# Patient Record
Sex: Female | Born: 1955
Health system: Southern US, Community
[De-identification: ages and names within clinical notes are randomized; demographics above are authoritative.]

## PROBLEM LIST (undated history)

## (undated) DIAGNOSIS — D649 Anemia, unspecified: Secondary | ICD-10-CM

## (undated) DIAGNOSIS — C801 Malignant (primary) neoplasm, unspecified: Secondary | ICD-10-CM

## (undated) DIAGNOSIS — G43909 Migraine, unspecified, not intractable, without status migrainosus: Secondary | ICD-10-CM

## (undated) DIAGNOSIS — G4730 Sleep apnea, unspecified: Secondary | ICD-10-CM

## (undated) DIAGNOSIS — F32A Depression, unspecified: Secondary | ICD-10-CM

## (undated) DIAGNOSIS — F329 Major depressive disorder, single episode, unspecified: Secondary | ICD-10-CM

## (undated) DIAGNOSIS — E079 Disorder of thyroid, unspecified: Secondary | ICD-10-CM

## (undated) DIAGNOSIS — M8580 Other specified disorders of bone density and structure, unspecified site: Secondary | ICD-10-CM

## (undated) DIAGNOSIS — K219 Gastro-esophageal reflux disease without esophagitis: Secondary | ICD-10-CM

## (undated) DIAGNOSIS — I10 Essential (primary) hypertension: Secondary | ICD-10-CM

## (undated) DIAGNOSIS — T7840XA Allergy, unspecified, initial encounter: Secondary | ICD-10-CM

## (undated) DIAGNOSIS — F419 Anxiety disorder, unspecified: Secondary | ICD-10-CM

## (undated) HISTORY — DX: Migraine, unspecified, not intractable, without status migrainosus: G43.909

## (undated) HISTORY — DX: Malignant (primary) neoplasm, unspecified: C80.1

## (undated) HISTORY — PX: GALLBLADDER SURGERY: SHX652

## (undated) HISTORY — DX: Essential (primary) hypertension: I10

## (undated) HISTORY — DX: Depression, unspecified: F32.A

## (undated) HISTORY — DX: Sleep apnea, unspecified: G47.30

## (undated) HISTORY — DX: Major depressive disorder, single episode, unspecified: F32.9

## (undated) HISTORY — PX: COLONOSCOPY: SHX174

## (undated) HISTORY — PX: PALATE / UVULA BIOPSY / EXCISION: SUR128

## (undated) HISTORY — DX: Anxiety disorder, unspecified: F41.9

## (undated) HISTORY — DX: Disorder of thyroid, unspecified: E07.9

## (undated) HISTORY — DX: Gastro-esophageal reflux disease without esophagitis: K21.9

## (undated) HISTORY — DX: Other specified disorders of bone density and structure, unspecified site: M85.80

## (undated) HISTORY — DX: Allergy, unspecified, initial encounter: T78.40XA

## (undated) HISTORY — PX: KNEE ARTHROSCOPY: SUR90

## (undated) HISTORY — DX: Anemia, unspecified: D64.9

---

## 1984-11-18 HISTORY — PX: NEUROFIBROMA EXCISION: SHX2089

## 2016-03-15 DIAGNOSIS — M25562 Pain in left knee: Secondary | ICD-10-CM | POA: Diagnosis not present

## 2016-03-15 DIAGNOSIS — M79642 Pain in left hand: Secondary | ICD-10-CM | POA: Diagnosis not present

## 2016-04-11 DIAGNOSIS — M79671 Pain in right foot: Secondary | ICD-10-CM | POA: Diagnosis not present

## 2016-04-11 DIAGNOSIS — M109 Gout, unspecified: Secondary | ICD-10-CM | POA: Diagnosis not present

## 2016-04-11 DIAGNOSIS — Z6824 Body mass index (BMI) 24.0-24.9, adult: Secondary | ICD-10-CM | POA: Diagnosis not present

## 2016-04-24 DIAGNOSIS — G4733 Obstructive sleep apnea (adult) (pediatric): Secondary | ICD-10-CM | POA: Diagnosis not present

## 2016-04-25 DIAGNOSIS — H524 Presbyopia: Secondary | ICD-10-CM | POA: Diagnosis not present

## 2016-07-17 DIAGNOSIS — M5127 Other intervertebral disc displacement, lumbosacral region: Secondary | ICD-10-CM | POA: Diagnosis not present

## 2016-07-17 DIAGNOSIS — Z981 Arthrodesis status: Secondary | ICD-10-CM | POA: Diagnosis not present

## 2016-07-17 DIAGNOSIS — M5126 Other intervertebral disc displacement, lumbar region: Secondary | ICD-10-CM | POA: Diagnosis not present

## 2016-07-17 DIAGNOSIS — D1809 Hemangioma of other sites: Secondary | ICD-10-CM | POA: Diagnosis not present

## 2016-07-18 DIAGNOSIS — M545 Low back pain: Secondary | ICD-10-CM | POA: Diagnosis not present

## 2016-07-18 DIAGNOSIS — M533 Sacrococcygeal disorders, not elsewhere classified: Secondary | ICD-10-CM | POA: Diagnosis not present

## 2016-08-14 DIAGNOSIS — G43809 Other migraine, not intractable, without status migrainosus: Secondary | ICD-10-CM | POA: Diagnosis not present

## 2016-08-29 DIAGNOSIS — R51 Headache: Secondary | ICD-10-CM | POA: Diagnosis not present

## 2016-08-29 DIAGNOSIS — R5383 Other fatigue: Secondary | ICD-10-CM | POA: Diagnosis not present

## 2016-08-29 DIAGNOSIS — F329 Major depressive disorder, single episode, unspecified: Secondary | ICD-10-CM | POA: Diagnosis not present

## 2016-09-02 DIAGNOSIS — D649 Anemia, unspecified: Secondary | ICD-10-CM | POA: Diagnosis not present

## 2016-09-02 DIAGNOSIS — R5383 Other fatigue: Secondary | ICD-10-CM | POA: Diagnosis not present

## 2016-09-02 DIAGNOSIS — D696 Thrombocytopenia, unspecified: Secondary | ICD-10-CM | POA: Diagnosis not present

## 2016-09-23 DIAGNOSIS — F4323 Adjustment disorder with mixed anxiety and depressed mood: Secondary | ICD-10-CM | POA: Diagnosis not present

## 2016-09-27 DIAGNOSIS — F329 Major depressive disorder, single episode, unspecified: Secondary | ICD-10-CM | POA: Diagnosis not present

## 2016-09-27 DIAGNOSIS — E039 Hypothyroidism, unspecified: Secondary | ICD-10-CM | POA: Diagnosis not present

## 2016-09-27 DIAGNOSIS — G43809 Other migraine, not intractable, without status migrainosus: Secondary | ICD-10-CM | POA: Diagnosis not present

## 2016-09-30 DIAGNOSIS — F4323 Adjustment disorder with mixed anxiety and depressed mood: Secondary | ICD-10-CM | POA: Diagnosis not present

## 2016-10-05 DIAGNOSIS — G4733 Obstructive sleep apnea (adult) (pediatric): Secondary | ICD-10-CM | POA: Diagnosis not present

## 2016-10-06 DIAGNOSIS — G4733 Obstructive sleep apnea (adult) (pediatric): Secondary | ICD-10-CM | POA: Diagnosis not present

## 2016-10-23 DIAGNOSIS — R0683 Snoring: Secondary | ICD-10-CM | POA: Diagnosis not present

## 2016-10-23 DIAGNOSIS — G4733 Obstructive sleep apnea (adult) (pediatric): Secondary | ICD-10-CM | POA: Diagnosis not present

## 2016-12-12 DIAGNOSIS — F4321 Adjustment disorder with depressed mood: Secondary | ICD-10-CM | POA: Diagnosis not present

## 2016-12-19 DIAGNOSIS — F4321 Adjustment disorder with depressed mood: Secondary | ICD-10-CM | POA: Diagnosis not present

## 2016-12-24 DIAGNOSIS — G4733 Obstructive sleep apnea (adult) (pediatric): Secondary | ICD-10-CM | POA: Diagnosis not present

## 2016-12-24 DIAGNOSIS — R0683 Snoring: Secondary | ICD-10-CM | POA: Diagnosis not present

## 2016-12-25 DIAGNOSIS — N6310 Unspecified lump in the right breast, unspecified quadrant: Secondary | ICD-10-CM | POA: Diagnosis not present

## 2016-12-25 DIAGNOSIS — M7661 Achilles tendinitis, right leg: Secondary | ICD-10-CM | POA: Diagnosis not present

## 2016-12-25 DIAGNOSIS — M25562 Pain in left knee: Secondary | ICD-10-CM | POA: Diagnosis not present

## 2016-12-25 DIAGNOSIS — N644 Mastodynia: Secondary | ICD-10-CM | POA: Diagnosis not present

## 2016-12-25 DIAGNOSIS — Z1231 Encounter for screening mammogram for malignant neoplasm of breast: Secondary | ICD-10-CM | POA: Diagnosis not present

## 2016-12-25 DIAGNOSIS — R928 Other abnormal and inconclusive findings on diagnostic imaging of breast: Secondary | ICD-10-CM | POA: Diagnosis not present

## 2016-12-26 DIAGNOSIS — F4321 Adjustment disorder with depressed mood: Secondary | ICD-10-CM | POA: Diagnosis not present

## 2016-12-30 DIAGNOSIS — F4321 Adjustment disorder with depressed mood: Secondary | ICD-10-CM | POA: Diagnosis not present

## 2016-12-30 DIAGNOSIS — R0683 Snoring: Secondary | ICD-10-CM | POA: Diagnosis not present

## 2016-12-30 DIAGNOSIS — G4733 Obstructive sleep apnea (adult) (pediatric): Secondary | ICD-10-CM | POA: Diagnosis not present

## 2017-01-03 DIAGNOSIS — Z6824 Body mass index (BMI) 24.0-24.9, adult: Secondary | ICD-10-CM | POA: Diagnosis not present

## 2017-01-03 DIAGNOSIS — Z01419 Encounter for gynecological examination (general) (routine) without abnormal findings: Secondary | ICD-10-CM | POA: Diagnosis not present

## 2017-01-06 DIAGNOSIS — F4321 Adjustment disorder with depressed mood: Secondary | ICD-10-CM | POA: Diagnosis not present

## 2017-01-09 DIAGNOSIS — D252 Subserosal leiomyoma of uterus: Secondary | ICD-10-CM | POA: Diagnosis not present

## 2017-01-09 DIAGNOSIS — N939 Abnormal uterine and vaginal bleeding, unspecified: Secondary | ICD-10-CM | POA: Diagnosis not present

## 2017-01-09 DIAGNOSIS — D251 Intramural leiomyoma of uterus: Secondary | ICD-10-CM | POA: Diagnosis not present

## 2017-01-09 DIAGNOSIS — F4321 Adjustment disorder with depressed mood: Secondary | ICD-10-CM | POA: Diagnosis not present

## 2017-01-09 DIAGNOSIS — Z780 Asymptomatic menopausal state: Secondary | ICD-10-CM | POA: Diagnosis not present

## 2017-01-16 DIAGNOSIS — R51 Headache: Secondary | ICD-10-CM | POA: Diagnosis not present

## 2017-01-16 DIAGNOSIS — G43719 Chronic migraine without aura, intractable, without status migrainosus: Secondary | ICD-10-CM | POA: Diagnosis not present

## 2017-01-16 DIAGNOSIS — Z79899 Other long term (current) drug therapy: Secondary | ICD-10-CM | POA: Diagnosis not present

## 2017-01-16 DIAGNOSIS — Z049 Encounter for examination and observation for unspecified reason: Secondary | ICD-10-CM | POA: Diagnosis not present

## 2017-01-20 DIAGNOSIS — M542 Cervicalgia: Secondary | ICD-10-CM | POA: Diagnosis not present

## 2017-01-20 DIAGNOSIS — M791 Myalgia: Secondary | ICD-10-CM | POA: Diagnosis not present

## 2017-01-20 DIAGNOSIS — G518 Other disorders of facial nerve: Secondary | ICD-10-CM | POA: Diagnosis not present

## 2017-01-20 DIAGNOSIS — G43719 Chronic migraine without aura, intractable, without status migrainosus: Secondary | ICD-10-CM | POA: Diagnosis not present

## 2017-01-21 DIAGNOSIS — M7661 Achilles tendinitis, right leg: Secondary | ICD-10-CM | POA: Diagnosis not present

## 2017-01-21 DIAGNOSIS — M25562 Pain in left knee: Secondary | ICD-10-CM | POA: Diagnosis not present

## 2017-01-27 DIAGNOSIS — F4321 Adjustment disorder with depressed mood: Secondary | ICD-10-CM | POA: Diagnosis not present

## 2017-01-28 DIAGNOSIS — R0683 Snoring: Secondary | ICD-10-CM | POA: Diagnosis not present

## 2017-01-28 DIAGNOSIS — G4733 Obstructive sleep apnea (adult) (pediatric): Secondary | ICD-10-CM | POA: Diagnosis not present

## 2017-02-04 DIAGNOSIS — G518 Other disorders of facial nerve: Secondary | ICD-10-CM | POA: Diagnosis not present

## 2017-02-04 DIAGNOSIS — M542 Cervicalgia: Secondary | ICD-10-CM | POA: Diagnosis not present

## 2017-02-04 DIAGNOSIS — M791 Myalgia: Secondary | ICD-10-CM | POA: Diagnosis not present

## 2017-02-04 DIAGNOSIS — G43719 Chronic migraine without aura, intractable, without status migrainosus: Secondary | ICD-10-CM | POA: Diagnosis not present

## 2017-02-24 DIAGNOSIS — M542 Cervicalgia: Secondary | ICD-10-CM | POA: Diagnosis not present

## 2017-02-24 DIAGNOSIS — M791 Myalgia: Secondary | ICD-10-CM | POA: Diagnosis not present

## 2017-02-24 DIAGNOSIS — G43719 Chronic migraine without aura, intractable, without status migrainosus: Secondary | ICD-10-CM | POA: Diagnosis not present

## 2017-02-24 DIAGNOSIS — G518 Other disorders of facial nerve: Secondary | ICD-10-CM | POA: Diagnosis not present

## 2017-02-26 DIAGNOSIS — M7661 Achilles tendinitis, right leg: Secondary | ICD-10-CM | POA: Diagnosis not present

## 2017-02-26 DIAGNOSIS — M792 Neuralgia and neuritis, unspecified: Secondary | ICD-10-CM | POA: Diagnosis not present

## 2017-03-12 DIAGNOSIS — G518 Other disorders of facial nerve: Secondary | ICD-10-CM | POA: Diagnosis not present

## 2017-03-12 DIAGNOSIS — G43719 Chronic migraine without aura, intractable, without status migrainosus: Secondary | ICD-10-CM | POA: Diagnosis not present

## 2017-03-12 DIAGNOSIS — M542 Cervicalgia: Secondary | ICD-10-CM | POA: Diagnosis not present

## 2017-03-12 DIAGNOSIS — M791 Myalgia: Secondary | ICD-10-CM | POA: Diagnosis not present

## 2017-03-13 DIAGNOSIS — M5431 Sciatica, right side: Secondary | ICD-10-CM | POA: Diagnosis not present

## 2017-03-24 DIAGNOSIS — G43719 Chronic migraine without aura, intractable, without status migrainosus: Secondary | ICD-10-CM | POA: Diagnosis not present

## 2017-03-24 DIAGNOSIS — M791 Myalgia: Secondary | ICD-10-CM | POA: Diagnosis not present

## 2017-03-24 DIAGNOSIS — M542 Cervicalgia: Secondary | ICD-10-CM | POA: Diagnosis not present

## 2017-03-24 DIAGNOSIS — G518 Other disorders of facial nerve: Secondary | ICD-10-CM | POA: Diagnosis not present

## 2017-03-26 DIAGNOSIS — G43719 Chronic migraine without aura, intractable, without status migrainosus: Secondary | ICD-10-CM | POA: Diagnosis not present

## 2017-03-26 DIAGNOSIS — M791 Myalgia: Secondary | ICD-10-CM | POA: Diagnosis not present

## 2017-03-26 DIAGNOSIS — G518 Other disorders of facial nerve: Secondary | ICD-10-CM | POA: Diagnosis not present

## 2017-03-26 DIAGNOSIS — M542 Cervicalgia: Secondary | ICD-10-CM | POA: Diagnosis not present

## 2017-03-31 DIAGNOSIS — M7661 Achilles tendinitis, right leg: Secondary | ICD-10-CM | POA: Diagnosis not present

## 2017-03-31 DIAGNOSIS — M79671 Pain in right foot: Secondary | ICD-10-CM | POA: Diagnosis not present

## 2017-03-31 DIAGNOSIS — Q667 Congenital pes cavus: Secondary | ICD-10-CM | POA: Diagnosis not present

## 2017-04-11 DIAGNOSIS — M7661 Achilles tendinitis, right leg: Secondary | ICD-10-CM | POA: Diagnosis not present

## 2017-04-15 DIAGNOSIS — M542 Cervicalgia: Secondary | ICD-10-CM | POA: Diagnosis not present

## 2017-04-15 DIAGNOSIS — G518 Other disorders of facial nerve: Secondary | ICD-10-CM | POA: Diagnosis not present

## 2017-04-15 DIAGNOSIS — G43719 Chronic migraine without aura, intractable, without status migrainosus: Secondary | ICD-10-CM | POA: Diagnosis not present

## 2017-04-15 DIAGNOSIS — M791 Myalgia: Secondary | ICD-10-CM | POA: Diagnosis not present

## 2017-04-30 DIAGNOSIS — M791 Myalgia: Secondary | ICD-10-CM | POA: Diagnosis not present

## 2017-04-30 DIAGNOSIS — G518 Other disorders of facial nerve: Secondary | ICD-10-CM | POA: Diagnosis not present

## 2017-04-30 DIAGNOSIS — M542 Cervicalgia: Secondary | ICD-10-CM | POA: Diagnosis not present

## 2017-04-30 DIAGNOSIS — G43719 Chronic migraine without aura, intractable, without status migrainosus: Secondary | ICD-10-CM | POA: Diagnosis not present

## 2017-05-13 DIAGNOSIS — H903 Sensorineural hearing loss, bilateral: Secondary | ICD-10-CM | POA: Diagnosis not present

## 2017-05-13 DIAGNOSIS — R1310 Dysphagia, unspecified: Secondary | ICD-10-CM | POA: Diagnosis not present

## 2017-05-13 DIAGNOSIS — K210 Gastro-esophageal reflux disease with esophagitis: Secondary | ICD-10-CM | POA: Diagnosis not present

## 2017-05-13 DIAGNOSIS — H9319 Tinnitus, unspecified ear: Secondary | ICD-10-CM | POA: Diagnosis not present

## 2017-05-14 DIAGNOSIS — Q667 Congenital pes cavus: Secondary | ICD-10-CM | POA: Diagnosis not present

## 2017-05-14 DIAGNOSIS — M7661 Achilles tendinitis, right leg: Secondary | ICD-10-CM | POA: Diagnosis not present

## 2017-05-28 DIAGNOSIS — R1310 Dysphagia, unspecified: Secondary | ICD-10-CM | POA: Diagnosis not present

## 2017-05-28 DIAGNOSIS — K449 Diaphragmatic hernia without obstruction or gangrene: Secondary | ICD-10-CM | POA: Diagnosis not present

## 2017-06-03 DIAGNOSIS — K210 Gastro-esophageal reflux disease with esophagitis: Secondary | ICD-10-CM | POA: Diagnosis not present

## 2017-06-03 DIAGNOSIS — K449 Diaphragmatic hernia without obstruction or gangrene: Secondary | ICD-10-CM | POA: Diagnosis not present

## 2017-06-03 DIAGNOSIS — R1310 Dysphagia, unspecified: Secondary | ICD-10-CM | POA: Diagnosis not present

## 2017-06-05 DIAGNOSIS — L700 Acne vulgaris: Secondary | ICD-10-CM | POA: Diagnosis not present

## 2017-06-05 DIAGNOSIS — D1801 Hemangioma of skin and subcutaneous tissue: Secondary | ICD-10-CM | POA: Diagnosis not present

## 2017-06-11 DIAGNOSIS — M25511 Pain in right shoulder: Secondary | ICD-10-CM | POA: Diagnosis not present

## 2017-06-24 DIAGNOSIS — G518 Other disorders of facial nerve: Secondary | ICD-10-CM | POA: Diagnosis not present

## 2017-06-24 DIAGNOSIS — M542 Cervicalgia: Secondary | ICD-10-CM | POA: Diagnosis not present

## 2017-06-24 DIAGNOSIS — M791 Myalgia: Secondary | ICD-10-CM | POA: Diagnosis not present

## 2017-06-24 DIAGNOSIS — G43719 Chronic migraine without aura, intractable, without status migrainosus: Secondary | ICD-10-CM | POA: Diagnosis not present

## 2017-06-26 DIAGNOSIS — G43719 Chronic migraine without aura, intractable, without status migrainosus: Secondary | ICD-10-CM | POA: Diagnosis not present

## 2017-06-26 DIAGNOSIS — M791 Myalgia: Secondary | ICD-10-CM | POA: Diagnosis not present

## 2017-06-26 DIAGNOSIS — M542 Cervicalgia: Secondary | ICD-10-CM | POA: Diagnosis not present

## 2017-06-26 DIAGNOSIS — G518 Other disorders of facial nerve: Secondary | ICD-10-CM | POA: Diagnosis not present

## 2017-07-14 DIAGNOSIS — N6311 Unspecified lump in the right breast, upper outer quadrant: Secondary | ICD-10-CM | POA: Diagnosis not present

## 2017-07-15 DIAGNOSIS — R928 Other abnormal and inconclusive findings on diagnostic imaging of breast: Secondary | ICD-10-CM | POA: Diagnosis not present

## 2017-07-15 DIAGNOSIS — N6489 Other specified disorders of breast: Secondary | ICD-10-CM | POA: Diagnosis not present

## 2017-07-30 DIAGNOSIS — G518 Other disorders of facial nerve: Secondary | ICD-10-CM | POA: Diagnosis not present

## 2017-07-30 DIAGNOSIS — M542 Cervicalgia: Secondary | ICD-10-CM | POA: Diagnosis not present

## 2017-07-30 DIAGNOSIS — G43719 Chronic migraine without aura, intractable, without status migrainosus: Secondary | ICD-10-CM | POA: Diagnosis not present

## 2017-07-30 DIAGNOSIS — M791 Myalgia: Secondary | ICD-10-CM | POA: Diagnosis not present

## 2017-09-11 DIAGNOSIS — M542 Cervicalgia: Secondary | ICD-10-CM | POA: Diagnosis not present

## 2017-09-11 DIAGNOSIS — M791 Myalgia, unspecified site: Secondary | ICD-10-CM | POA: Diagnosis not present

## 2017-09-11 DIAGNOSIS — G518 Other disorders of facial nerve: Secondary | ICD-10-CM | POA: Diagnosis not present

## 2017-09-11 DIAGNOSIS — G43719 Chronic migraine without aura, intractable, without status migrainosus: Secondary | ICD-10-CM | POA: Diagnosis not present

## 2017-10-22 DIAGNOSIS — G43719 Chronic migraine without aura, intractable, without status migrainosus: Secondary | ICD-10-CM | POA: Diagnosis not present

## 2017-10-22 DIAGNOSIS — G518 Other disorders of facial nerve: Secondary | ICD-10-CM | POA: Diagnosis not present

## 2017-10-22 DIAGNOSIS — M791 Myalgia, unspecified site: Secondary | ICD-10-CM | POA: Diagnosis not present

## 2017-10-22 DIAGNOSIS — M542 Cervicalgia: Secondary | ICD-10-CM | POA: Diagnosis not present

## 2017-10-29 DIAGNOSIS — G518 Other disorders of facial nerve: Secondary | ICD-10-CM | POA: Diagnosis not present

## 2017-10-29 DIAGNOSIS — M791 Myalgia, unspecified site: Secondary | ICD-10-CM | POA: Diagnosis not present

## 2017-10-29 DIAGNOSIS — M542 Cervicalgia: Secondary | ICD-10-CM | POA: Diagnosis not present

## 2017-10-29 DIAGNOSIS — G43719 Chronic migraine without aura, intractable, without status migrainosus: Secondary | ICD-10-CM | POA: Diagnosis not present

## 2017-11-02 NOTE — Progress Notes (Signed)
Subjective:    Patient ID: Tracie Hart, female    DOB: Jul 28, 1956, 61 y.o.   MRN: [id number]  HPI  She is here to establish with a new pcp.  She is here for follow up.   Hypothyroidism:  She is taking her medication daily.  She denies any recent changes in energy or weight that are unexplained.   Depression: She is taking her medication daily as prescribed - she is currently taking only 20 mg daily.  The 30 mg was more effective, but she knows she should takes less.  That is why she decreased back to 20 mg. She still feels depressed.  She thinks 30 mg was better and is unsure what to do.  She denies any side effects from the medication. She is not currently exercising.  She is not seeing a therapist.    Migraine headaches:  Follows at the headache clinic. Getting botox and steroid injections.  Uses imitrex as needed.   She has frequent migraines.  She is not compliant with avoiding the triggers.    GERD:  She is taking her medication daily as prescribed.  She denies any GERD symptoms and feels her GERD is well controlled.  She is symptomatic if she misses a dose.     She is active, but does not exercise regularly.    Medications and allergies reviewed with patient and updated if appropriate.  Patient Active Problem List   Diagnosis Date Noted  . Hypothyroidism 11/03/2017  . Depression 11/03/2017  . Family history of diabetes mellitus (DM) 11/03/2017  . Migraines 11/03/2017  . GERD (gastroesophageal reflux disease) 11/03/2017  . OSA (obstructive sleep apnea) 11/03/2017  . Elevated blood pressure reading 11/03/2017  . Hyperlipidemia 11/03/2017  . Overactive bladder 11/03/2017    Current Outpatient Medications on File Prior to Visit  Medication Sig Dispense Refill  . SUMAtriptan (IMITREX) 100 MG tablet Take 100 mg by mouth every 2 (two) hours as needed for migraine. May repeat in 2 hours if headache persists or recurs.     No current facility-administered medications on file  prior to visit.     Past Medical History:  Diagnosis Date  . Depression   . GERD (gastroesophageal reflux disease)   . Migraine   . Thyroid disease     Past Surgical History:  Procedure Laterality Date  . GALLBLADDER SURGERY    . TONSILLECTOMY      Social History   Socioeconomic History  . Marital status: Divorced    Spouse name: None  . Number of children: None  . Years of education: None  . Highest education level: None  Social Needs  . Financial resource strain: None  . Food insecurity - worry: None  . Food insecurity - inability: None  . Transportation needs - medical: None  . Transportation needs - non-medical: None  Occupational History  . None  Tobacco Use  . Smoking status: Never Smoker  . Smokeless tobacco: Never Used  Substance and Sexual Activity  . Alcohol use: Yes  . Drug use: No  . Sexual activity: No  Other Topics Concern  . None  Social History Narrative   No regular exercise    Family History  Problem Relation Age of Onset  . Arthritis Mother   . Diabetes Mother   . Heart disease Mother   . Hypertension Mother   . Stroke Mother   . Arthritis Father   . Depression Father   . Diabetes Father   .  Hyperlipidemia Father   . Hypertension Father   . Depression Brother   . Hypertension Brother   . Heart disease Maternal Grandmother   . COPD Paternal Grandmother   . COPD Paternal Grandfather     Review of Systems  Constitutional: Negative for appetite change, chills, fever and unexpected weight change.  Respiratory: Negative for cough, shortness of breath and wheezing.   Cardiovascular: Positive for leg swelling (from prolonged standing). Negative for chest pain and palpitations.  Gastrointestinal: Positive for anal bleeding (once). Negative for abdominal pain, blood in stool, constipation, diarrhea and nausea.  Genitourinary: Positive for frequency. Negative for dysuria and hematuria.  Musculoskeletal: Positive for back pain  (neurofibroma removed from back - uses tens unit as needed).  Neurological: Positive for headaches (migrianes). Negative for light-headedness.  Psychiatric/Behavioral: Positive for dysphoric mood. The patient is nervous/anxious.        Objective:   Vitals:   11/03/17 1512  BP: (!) 142/94  Pulse: 78  Resp: 16  Temp: 98.3 F (36.8 C)  SpO2: 98%   Wt Readings from Last 3 Encounters:  11/03/17 148 lb (67.1 kg)   Body mass index is 25.01 kg/m.   Physical Exam    Constitutional: She appears well-developed and well-nourished. No distress.  HENT:  Head: Normocephalic and atraumatic.  Right Ear: External ear normal. Normal ear canal and TM Left Ear: External ear normal.  Normal ear canal and TM Mouth/Throat: Oropharynx is clear and moist.  Eyes: Conjunctivae are normal.  Neck: Neck supple. No tracheal deviation present. No thyromegaly present.  No carotid bruit  Cardiovascular: Normal rate, regular rhythm and normal heart sounds.   No murmur heard.  No edema. Pulmonary/Chest: Effort normal and breath sounds normal. No respiratory distress. She has no wheezes. She has no rales.  Abdominal: Soft. She exhibits no distension. There is no tenderness.  Lymphadenopathy: She has no cervical adenopathy.  Skin: Skin is warm and dry. She is not diaphoretic.  Psychiatric: She has a normal mood and affect. Her behavior is normal.       Assessment & Plan:    See Problem List for Assessment and Plan of chronic medical problems.   FU in 6 months

## 2017-11-03 ENCOUNTER — Ambulatory Visit: Payer: BLUE CROSS/BLUE SHIELD | Admitting: Internal Medicine

## 2017-11-03 ENCOUNTER — Encounter: Payer: Self-pay | Admitting: Internal Medicine

## 2017-11-03 VITALS — BP 142/94 | HR 78 | Temp 98.3°F | Resp 16 | Ht 64.5 in | Wt 148.0 lb

## 2017-11-03 DIAGNOSIS — G43809 Other migraine, not intractable, without status migrainosus: Secondary | ICD-10-CM

## 2017-11-03 DIAGNOSIS — F3289 Other specified depressive episodes: Secondary | ICD-10-CM | POA: Diagnosis not present

## 2017-11-03 DIAGNOSIS — Z114 Encounter for screening for human immunodeficiency virus [HIV]: Secondary | ICD-10-CM | POA: Diagnosis not present

## 2017-11-03 DIAGNOSIS — Z1159 Encounter for screening for other viral diseases: Secondary | ICD-10-CM

## 2017-11-03 DIAGNOSIS — G43909 Migraine, unspecified, not intractable, without status migrainosus: Secondary | ICD-10-CM | POA: Insufficient documentation

## 2017-11-03 DIAGNOSIS — Z833 Family history of diabetes mellitus: Secondary | ICD-10-CM

## 2017-11-03 DIAGNOSIS — N3281 Overactive bladder: Secondary | ICD-10-CM | POA: Insufficient documentation

## 2017-11-03 DIAGNOSIS — E7849 Other hyperlipidemia: Secondary | ICD-10-CM

## 2017-11-03 DIAGNOSIS — R030 Elevated blood-pressure reading, without diagnosis of hypertension: Secondary | ICD-10-CM | POA: Insufficient documentation

## 2017-11-03 DIAGNOSIS — E785 Hyperlipidemia, unspecified: Secondary | ICD-10-CM | POA: Insufficient documentation

## 2017-11-03 DIAGNOSIS — K219 Gastro-esophageal reflux disease without esophagitis: Secondary | ICD-10-CM

## 2017-11-03 DIAGNOSIS — G4733 Obstructive sleep apnea (adult) (pediatric): Secondary | ICD-10-CM

## 2017-11-03 DIAGNOSIS — E039 Hypothyroidism, unspecified: Secondary | ICD-10-CM | POA: Insufficient documentation

## 2017-11-03 DIAGNOSIS — E038 Other specified hypothyroidism: Secondary | ICD-10-CM | POA: Diagnosis not present

## 2017-11-03 DIAGNOSIS — F32A Depression, unspecified: Secondary | ICD-10-CM | POA: Insufficient documentation

## 2017-11-03 DIAGNOSIS — F329 Major depressive disorder, single episode, unspecified: Secondary | ICD-10-CM | POA: Insufficient documentation

## 2017-11-03 MED ORDER — LANSOPRAZOLE 30 MG PO CPDR
30.0000 mg | DELAYED_RELEASE_CAPSULE | Freq: Every day | ORAL | 3 refills | Status: DC
Start: 2017-11-03 — End: 2018-10-31

## 2017-11-03 MED ORDER — LEVOTHYROXINE SODIUM 50 MCG PO TABS: 50.0000 ug | ORAL_TABLET | Freq: Every day | ORAL | 3 refills | Status: DC

## 2017-11-03 MED ORDER — ESCITALOPRAM OXALATE 20 MG PO TABS: 20.0000 mg | ORAL_TABLET | Freq: Every day | ORAL | 3 refills | Status: DC

## 2017-11-03 MED ORDER — ESCITALOPRAM OXALATE 10 MG PO TABS: 10.0000 mg | ORAL_TABLET | Freq: Every day | ORAL | 3 refills | Status: DC

## 2017-11-03 NOTE — Assessment & Plan Note (Signed)
Clinically euthyroid Check tsh  Titrate med dose if needed  

## 2017-11-03 NOTE — Assessment & Plan Note (Signed)
BP has been borderline high at doctor's office Has cuff at home - does not check it Start regular exercise Start monitoring BP Check labs

## 2017-11-03 NOTE — Assessment & Plan Note (Signed)
Not controlled Discussed continuing lexapro vs changing to a different medication - wants to stick with lexapro but most likely will do better with a different medication Will continue 20 mg daily, but may take 30 mg intermittently Discussed seeing a therapist Stressed regular exercise

## 2017-11-03 NOTE — Patient Instructions (Addendum)
  Test(s) ordered today. Your results will be released to Elizabeth (or called to you) after review, usually within 72hours after test completion. If any changes need to be made, you will be notified at that same time.  No immunizations administered today.   Medications reviewed and updated.  No changes recommended at this time.  Your prescription(s) have been submitted to your pharmacy. Please take as directed and contact our office if you believe you are having problem(s) with the medication(s).   Please followup in 6 months   [facility] [place] [facility]   [facility]  [address]  [place]  [place], [place] [address]  Main: [phone]

## 2017-11-03 NOTE — Assessment & Plan Note (Addendum)
Diet controlled Check lipids, cmp

## 2017-11-03 NOTE — Assessment & Plan Note (Signed)
GERD controlled, but needs medication daily Continue daily medication

## 2017-11-03 NOTE — Assessment & Plan Note (Signed)
Using mouth device

## 2017-11-03 NOTE — Assessment & Plan Note (Signed)
myrbetriq was not effective Decrease fluids in evening, avoid too much caffeine

## 2017-11-03 NOTE — Assessment & Plan Note (Signed)
Not compliant with a low sugar/carb diet Stressed regular exercise Check a1c

## 2017-11-03 NOTE — Assessment & Plan Note (Signed)
Management per the headaches clinic Trying to avoid imitrex Stressed avoiding triggers, such as sugar

## 2017-11-18 DIAGNOSIS — M542 Cervicalgia: Secondary | ICD-10-CM | POA: Diagnosis not present

## 2017-11-18 DIAGNOSIS — G43719 Chronic migraine without aura, intractable, without status migrainosus: Secondary | ICD-10-CM | POA: Diagnosis not present

## 2017-11-18 DIAGNOSIS — G518 Other disorders of facial nerve: Secondary | ICD-10-CM | POA: Diagnosis not present

## 2017-11-18 DIAGNOSIS — M791 Myalgia, unspecified site: Secondary | ICD-10-CM | POA: Diagnosis not present

## 2017-11-18 DIAGNOSIS — F4323 Adjustment disorder with mixed anxiety and depressed mood: Secondary | ICD-10-CM | POA: Diagnosis not present

## 2017-12-01 ENCOUNTER — Telehealth: Payer: Self-pay | Admitting: Internal Medicine

## 2017-12-01 MED ORDER — SUMATRIPTAN SUCCINATE 100 MG PO TABS: 100.0000 mg | ORAL_TABLET | ORAL | 0 refills | Status: DC | PRN

## 2017-12-01 NOTE — Telephone Encounter (Signed)
Reviewed chart pt is up-to-date sent refills to pof.../lmb  

## 2017-12-01 NOTE — Telephone Encounter (Signed)
Copied from [place] [id number]. Topic: Quick Communication - See Telephone Encounter >> Dec 01, 2017  3:18 PM Tracie Hart wrote: CRM for notification. See Telephone encounter for:   12/01/17.  Patient is requesting her SUMAtriptan (IMITREX) 100 MG tablet. She said that she always takes this but Dr Quay Burow has never written it for her, but she is aware. It is in her chart.   [facility] [place], [place] [address] [place]

## 2017-12-04 DIAGNOSIS — G518 Other disorders of facial nerve: Secondary | ICD-10-CM | POA: Diagnosis not present

## 2017-12-04 DIAGNOSIS — M542 Cervicalgia: Secondary | ICD-10-CM | POA: Diagnosis not present

## 2017-12-04 DIAGNOSIS — M791 Myalgia, unspecified site: Secondary | ICD-10-CM | POA: Diagnosis not present

## 2017-12-04 DIAGNOSIS — F4323 Adjustment disorder with mixed anxiety and depressed mood: Secondary | ICD-10-CM | POA: Diagnosis not present

## 2017-12-04 DIAGNOSIS — G43719 Chronic migraine without aura, intractable, without status migrainosus: Secondary | ICD-10-CM | POA: Diagnosis not present

## 2017-12-09 DIAGNOSIS — H5203 Hypermetropia, bilateral: Secondary | ICD-10-CM | POA: Diagnosis not present

## 2017-12-18 DIAGNOSIS — F4323 Adjustment disorder with mixed anxiety and depressed mood: Secondary | ICD-10-CM | POA: Diagnosis not present

## 2017-12-25 DIAGNOSIS — F4323 Adjustment disorder with mixed anxiety and depressed mood: Secondary | ICD-10-CM | POA: Diagnosis not present

## 2018-01-03 NOTE — Progress Notes (Signed)
Subjective:    Patient ID: Tracie Hart, female    DOB: [birth year], 62 y.o.   MRN: [id number]  HPI The patient is here for follow up.  Depression: She is taking her medication daily as prescribed. She denies any side effects from the medication.  She was on 20 mg daily, but started to feel increased depression so she increased her dose back to 30 mg daily.  She had been on 30 mg in the past and it was effective.  She was concerned about being on the medication long-term and being on 30 mg, which is above the recommended maximum dose.  She was wondering if she should continue this or do something different.  She is feeling depressed.  She is a long family history of depression and is unsure how much it is genetic versus circumstantial.  Neurofibroma in spinal cord at 48.  Has a few lumps in right chest wall, right arm - all cysts.  Has several bumps and keeps getting more.  She is concerned about them and wants to make sure they are not anything more concerning.  She would consider having some of them removed, but is unsure if she needs to or not.  She wondered about seeing a surgeon to discuss this.  Epigastric pain: She had her gallbladder removed in 2014.  Last week after eating oatmeal and blueberries she had several episodes of violent vomiting associated with epigastric pain.  She is still experiencing stomach pain.  She denies any heartburn.  She takes her Prevacid daily.  For a few days she did increase that to twice daily to see if it would help.  She does have a history of H. pylori several years ago, which was treated.  She is unsure why she had the vomiting episode.  She has had that in the past related to certain foods, but this time she ate very bland.  She was unsure if she should just monitor this for now or have evaluated.      Medications and allergies reviewed with patient and updated if appropriate.  Patient Active Problem List   Diagnosis Date Noted  . Hypothyroidism  11/03/2017  . Depression 11/03/2017  . Family history of diabetes mellitus (DM) 11/03/2017  . Migraines 11/03/2017  . GERD (gastroesophageal reflux disease) 11/03/2017  . OSA (obstructive sleep apnea) 11/03/2017  . Elevated blood pressure reading 11/03/2017  . Hyperlipidemia 11/03/2017  . Overactive bladder 11/03/2017    Current Outpatient Medications on File Prior to Visit  Medication Sig Dispense Refill  . escitalopram (LEXAPRO) 10 MG tablet Take 1 tablet (10 mg total) by mouth daily. 90 tablet 3  . escitalopram (LEXAPRO) 20 MG tablet Take 1 tablet (20 mg total) by mouth daily. 90 tablet 3  . lansoprazole (PREVACID) 30 MG capsule Take 1 capsule (30 mg total) by mouth daily at 12 noon. 90 capsule 3  . levothyroxine (SYNTHROID, LEVOTHROID) 50 MCG tablet Take 1 tablet (50 mcg total) by mouth daily before breakfast. 90 tablet 3  . SUMAtriptan (IMITREX) 100 MG tablet Take 1 tablet (100 mg total) by mouth every 2 (two) hours as needed for migraine. May repeat in 2 hours if headache persists or recurs. 10 tablet 0   No current facility-administered medications on file prior to visit.     Past Medical History:  Diagnosis Date  . Depression   . GERD (gastroesophageal reflux disease)   . Migraine   . Thyroid disease     Past Surgical  History:  Procedure Laterality Date  . GALLBLADDER SURGERY    . TONSILLECTOMY      Social History   Socioeconomic History  . Marital status: Divorced    Spouse name: None  . Number of children: None  . Years of education: None  . Highest education level: None  Social Needs  . Financial resource strain: None  . Food insecurity - worry: None  . Food insecurity - inability: None  . Transportation needs - medical: None  . Transportation needs - non-medical: None  Occupational History  . None  Tobacco Use  . Smoking status: Never Smoker  . Smokeless tobacco: Never Used  Substance and Sexual Activity  . Alcohol use: Yes  . Drug use: No  .  Sexual activity: No  Other Topics Concern  . None  Social History Narrative   No regular exercise    Family History  Problem Relation Age of Onset  . Arthritis Mother   . Diabetes Mother   . Heart disease Mother   . Hypertension Mother   . Stroke Mother   . Arthritis Father   . Depression Father   . Diabetes Father   . Hyperlipidemia Father   . Hypertension Father   . Depression Brother   . Hypertension Brother   . Heart disease Maternal Grandmother   . COPD Paternal Grandmother   . COPD Paternal Grandfather     Review of Systems  Constitutional: Negative for appetite change and fever.  Cardiovascular: Negative for chest pain.  Gastrointestinal: Positive for abdominal pain (Epigastric, right upper quadrant). Negative for nausea (None since last week) and vomiting (None since last week).       No GERD  Skin:       Several lumps under her skin but are nontender unless she pushes on them a lot.  Right upper chest, right arm, left leg  Psychiatric/Behavioral: Positive for dysphoric mood. The patient is not nervous/anxious.        Objective:   Vitals:   01/05/18 0934  BP: (!) 140/98  Pulse: 77  Resp: 18  Temp: 98.1 F (36.7 C)  SpO2: 98%   Wt Readings from Last 3 Encounters:  01/05/18 148 lb (67.1 kg)  11/03/17 148 lb (67.1 kg)   Body mass index is 25.01 kg/m.   Physical Exam    Constitutional: Appears well-developed and well-nourished. No distress.  HENT:  Head: Normocephalic and atraumatic.  Abdomen: Mild tenderness epigastric region, no other tenderness, no rebound or guarding, no mass Skin: Skin is warm and dry. Not diaphoretic. Several lumps under skin, nontender unless palpated too much, right chest wall size and shape of an almond   Psychiatric: Depressed mood and affect. Behavior is normal.      Assessment & Plan:    See Problem List for Assessment and Plan of chronic medical problems.

## 2018-01-05 ENCOUNTER — Encounter: Payer: Self-pay | Admitting: Internal Medicine

## 2018-01-05 ENCOUNTER — Ambulatory Visit: Payer: BLUE CROSS/BLUE SHIELD | Admitting: Internal Medicine

## 2018-01-05 VITALS — BP 140/98 | HR 77 | Temp 98.1°F | Resp 18 | Wt 148.0 lb

## 2018-01-05 DIAGNOSIS — K219 Gastro-esophageal reflux disease without esophagitis: Secondary | ICD-10-CM

## 2018-01-05 DIAGNOSIS — L729 Follicular cyst of the skin and subcutaneous tissue, unspecified: Secondary | ICD-10-CM | POA: Diagnosis not present

## 2018-01-05 DIAGNOSIS — D179 Benign lipomatous neoplasm, unspecified: Secondary | ICD-10-CM | POA: Insufficient documentation

## 2018-01-05 DIAGNOSIS — F3289 Other specified depressive episodes: Secondary | ICD-10-CM | POA: Diagnosis not present

## 2018-01-05 DIAGNOSIS — R1013 Epigastric pain: Secondary | ICD-10-CM | POA: Insufficient documentation

## 2018-01-05 MED ORDER — BUPROPION HCL ER (XL) 150 MG PO TB24: 150.0000 mg | ORAL_TABLET | Freq: Every day | ORAL | 5 refills | Status: DC

## 2018-01-05 NOTE — Assessment & Plan Note (Signed)
Not controlled Discussed options We will decrease Lexapro to 20 mg daily and add Wellbutrin 150 mg daily.  Can increase Wellbutrin to 300 mg daily if she tolerates the medication well If the above regimen does not work will discontinue both medications and consider starting Pristiq if covered

## 2018-01-05 NOTE — Patient Instructions (Addendum)
Decrease your lexapro to 20 mg daily.  Start wellbutrin 150 mg daily in the morning.    If medication regimen does not work we can consider pristiq.      A referral was ordered for surgery - they will call you to schedule an appointment.

## 2018-01-05 NOTE — Assessment & Plan Note (Signed)
Controlled Continue daily Prevacid Advised to add Zantac if her heartburn is not truly controlled

## 2018-01-05 NOTE — Assessment & Plan Note (Signed)
History of fibroadenoma and spinal cord and she is concerned about numerous cysts under her skin-right upper chest, arm, leg Will refer to surgery to consider having some removed

## 2018-01-05 NOTE — Assessment & Plan Note (Signed)
Mild discomfort, but improved since last week GERD seems to be controlled ?  Cause for nausea/vomiting Still has some residual discomfort, but improving Continue Prevacid.  GERD seems to be controlled, but advised that if it is not consider adding Zantac at bedtime If abdominal pain persists or recurs she will let me know and I will refer to GI

## 2018-01-13 ENCOUNTER — Other Ambulatory Visit: Payer: Self-pay | Admitting: Internal Medicine

## 2018-01-13 NOTE — Telephone Encounter (Signed)
Copied from [place]. Topic: Quick Communication - Rx Refill/Question >> Jan 13, 2018  2:36 PM Burnis Medin, NT wrote: Medication:  SUMAtriptan (IMITREX) 100 MG tablet  Has the patient contacted their pharmacy? Yes   (Agent: If no, request that the patient contact the pharmacy for the refill.)   Preferred Pharmacy (with phone number or street name): [facility] [place], [place] [facility] [address] [place] [phone] (Phone) [phone] (Fax)     Agent: Please be advised that RX refills may take up to 3 business days. We ask that you follow-up with your pharmacy.

## 2018-01-14 MED ORDER — SUMATRIPTAN SUCCINATE 100 MG PO TABS: 100.0000 mg | ORAL_TABLET | ORAL | 0 refills | Status: DC | PRN

## 2018-01-14 NOTE — Telephone Encounter (Signed)
Did not refill due to BP   01/05/18  140/98  11/03/17 142/94  LOV 01/05/18  Dr. Quay Burow  [facility] [phone] on [address]   [place], [place]

## 2018-01-19 DIAGNOSIS — F4323 Adjustment disorder with mixed anxiety and depressed mood: Secondary | ICD-10-CM | POA: Diagnosis not present

## 2018-01-19 DIAGNOSIS — G43719 Chronic migraine without aura, intractable, without status migrainosus: Secondary | ICD-10-CM | POA: Diagnosis not present

## 2018-01-20 DIAGNOSIS — F4323 Adjustment disorder with mixed anxiety and depressed mood: Secondary | ICD-10-CM | POA: Diagnosis not present

## 2018-01-21 DIAGNOSIS — D179 Benign lipomatous neoplasm, unspecified: Secondary | ICD-10-CM | POA: Diagnosis not present

## 2018-01-28 DIAGNOSIS — M542 Cervicalgia: Secondary | ICD-10-CM | POA: Diagnosis not present

## 2018-01-28 DIAGNOSIS — G43719 Chronic migraine without aura, intractable, without status migrainosus: Secondary | ICD-10-CM | POA: Diagnosis not present

## 2018-01-28 DIAGNOSIS — M791 Myalgia, unspecified site: Secondary | ICD-10-CM | POA: Diagnosis not present

## 2018-01-28 DIAGNOSIS — G518 Other disorders of facial nerve: Secondary | ICD-10-CM | POA: Diagnosis not present

## 2018-02-02 DIAGNOSIS — F4323 Adjustment disorder with mixed anxiety and depressed mood: Secondary | ICD-10-CM | POA: Diagnosis not present

## 2018-02-27 DIAGNOSIS — M791 Myalgia, unspecified site: Secondary | ICD-10-CM | POA: Diagnosis not present

## 2018-02-27 DIAGNOSIS — M542 Cervicalgia: Secondary | ICD-10-CM | POA: Diagnosis not present

## 2018-02-27 DIAGNOSIS — G43719 Chronic migraine without aura, intractable, without status migrainosus: Secondary | ICD-10-CM | POA: Diagnosis not present

## 2018-02-27 DIAGNOSIS — G518 Other disorders of facial nerve: Secondary | ICD-10-CM | POA: Diagnosis not present

## 2018-03-12 DIAGNOSIS — Z6825 Body mass index (BMI) 25.0-25.9, adult: Secondary | ICD-10-CM | POA: Diagnosis not present

## 2018-03-12 DIAGNOSIS — Z01419 Encounter for gynecological examination (general) (routine) without abnormal findings: Secondary | ICD-10-CM | POA: Diagnosis not present

## 2018-03-16 ENCOUNTER — Ambulatory Visit: Payer: Self-pay

## 2018-03-16 NOTE — Telephone Encounter (Signed)
C/o dizziness and stomach pain. Dizziness has started 5 weeks. "Spells last 45 seconds." Has had episodes when she first wakes up and gets out of bed and when she is moving or exercising. Had gallbladder out "about 5 years ago and have had some nausea and vomiting and then I'm ok." "I don't know if the two are related or not."  Reason for Disposition . [1] MODERATE dizziness (e.g., vertigo; feels very unsteady, interferes with normal activities) AND [2] has NOT been evaluated by physician for this  Answer Assessment - Initial Assessment Questions 1. DESCRIPTION: "Describe your dizziness."     Vertigo 2. VERTIGO: "Do you feel like either you or the room is spinning or tilting?"      Yes 3. LIGHTHEADED: "Do you feel lightheaded?" (e.g., somewhat faint, woozy, weak upon standing)     Changing position 4. SEVERITY: "How bad is it?"  "Can you walk?"   - MILD - Feels unsteady but walking normally.   - MODERATE - Feels very unsteady when walking, but not falling; interferes with normal activities (e.g., school, work) .   - SEVERE - Unable to walk without falling (requires assistance).     Mild 5. ONSET:  "When did the dizziness begin?"     5 weeks ago 6. AGGRAVATING FACTORS: "Does anything make it worse?" (e.g., standing, change in head position)     No 7. CAUSE: "What do you think is causing the dizziness?"     Unsure 8. RECURRENT SYMPTOM: "Have you had dizziness before?" If so, ask: "When was the last time?" "What happened that time?"     No 9. OTHER SYMPTOMS: "Do you have any other symptoms?" (e.g., headache, weakness, numbness, vomiting, earache)     Stomach pain 10. PREGNANCY: "Is there any chance you are pregnant?" "When was your last menstrual period?"       No  Protocols used: DIZZINESS - VERTIGO-A-AH

## 2018-03-17 ENCOUNTER — Telehealth: Payer: Self-pay | Admitting: Family Medicine

## 2018-03-17 ENCOUNTER — Other Ambulatory Visit (INDEPENDENT_AMBULATORY_CARE_PROVIDER_SITE_OTHER): Payer: BLUE CROSS/BLUE SHIELD

## 2018-03-17 ENCOUNTER — Ambulatory Visit: Payer: BLUE CROSS/BLUE SHIELD | Admitting: Family Medicine

## 2018-03-17 ENCOUNTER — Encounter: Payer: Self-pay | Admitting: Family Medicine

## 2018-03-17 ENCOUNTER — Ambulatory Visit (INDEPENDENT_AMBULATORY_CARE_PROVIDER_SITE_OTHER)
Admission: RE | Admit: 2018-03-17 | Discharge: 2018-03-17 | Disposition: A | Discharge status: Home / Self Care | Payer: BLUE CROSS/BLUE SHIELD | Source: Ambulatory Visit | Attending: Family Medicine | Admitting: Family Medicine

## 2018-03-17 VITALS — BP 118/62 | HR 71 | Temp 99.1°F | Ht 64.5 in | Wt 145.0 lb

## 2018-03-17 DIAGNOSIS — R1010 Upper abdominal pain, unspecified: Secondary | ICD-10-CM | POA: Diagnosis not present

## 2018-03-17 DIAGNOSIS — G8929 Other chronic pain: Secondary | ICD-10-CM | POA: Diagnosis not present

## 2018-03-17 DIAGNOSIS — M25562 Pain in left knee: Secondary | ICD-10-CM

## 2018-03-17 DIAGNOSIS — R42 Dizziness and giddiness: Secondary | ICD-10-CM

## 2018-03-17 LAB — COMPREHENSIVE METABOLIC PANEL
ALK PHOS: 56 U/L (ref 39–117)
ALT: 13 U/L (ref 0–35)
AST: 14 U/L (ref 0–37)
Albumin: 4.1 g/dL (ref 3.5–5.2)
BILIRUBIN TOTAL: 0.6 mg/dL (ref 0.2–1.2)
BUN: 19 mg/dL (ref 6–23)
CALCIUM: 9.2 mg/dL (ref 8.4–10.5)
CO2: 31 mEq/L (ref 19–32)
Chloride: 102 mEq/L (ref 96–112)
Creatinine, Ser: 0.75 mg/dL (ref 0.40–1.20)
GFR: 83.29 mL/min (ref >60.00)
Glucose, Bld: 91 mg/dL (ref 70–99)
POTASSIUM: 3.9 meq/L (ref 3.5–5.1)
Sodium: 140 mEq/L (ref 135–145)
TOTAL PROTEIN: 6.7 g/dL (ref 6.0–8.3)

## 2018-03-17 LAB — CBC WITH DIFFERENTIAL/PLATELET
BASOS ABS: 0 10*3/uL (ref 0.0–0.1)
Basophils Relative: 0.3 % (ref 0.0–3.0)
EOS PCT: 1.2 % (ref 0.0–5.0)
Eosinophils Absolute: 0.1 10*3/uL (ref 0.0–0.7)
HEMATOCRIT: 43 % (ref 36.0–46.0)
HEMOGLOBIN: 14.5 g/dL (ref 12.0–15.0)
LYMPHS PCT: 23.1 % (ref 12.0–46.0)
Lymphs Abs: 1.3 10*3/uL (ref 0.7–4.0)
MCHC: 33.6 g/dL (ref 30.0–36.0)
MCV: 82.7 fl (ref 78.0–100.0)
MONOS PCT: 7.3 % (ref 3.0–12.0)
Monocytes Absolute: 0.4 10*3/uL (ref 0.1–1.0)
Neutro Abs: 3.9 10*3/uL (ref 1.4–7.7)
Neutrophils Relative %: 68.1 % (ref 43.0–77.0)
Platelets: 130 10*3/uL — ABNORMAL LOW (ref 150.0–400.0)
RBC: 5.19 Mil/uL — AB (ref 3.87–5.11)
RDW: 13.8 % (ref 11.5–15.5)
WBC: 5.7 10*3/uL (ref 4.0–10.5)

## 2018-03-17 LAB — AMYLASE: Amylase: 63 U/L (ref 27–131)

## 2018-03-17 LAB — LIPASE: Lipase: 17 U/L (ref 11.0–59.0)

## 2018-03-17 MED ORDER — DICLOFENAC SODIUM 2 % TD SOLN: 1.0000 "application " | Freq: Two times a day (BID) | TRANSDERMAL | 3 refills | Status: DC

## 2018-03-17 NOTE — Assessment & Plan Note (Signed)
Seems to be related to BPPV - counseled on Epley maneuver  -Can try over-the-counter medication for motion sickness. -If no improvement consider Antivert or referral to physical therapy.

## 2018-03-17 NOTE — Patient Instructions (Addendum)
Good to meet you  I will call you with the results.  Please try the pennsaid of your knee. Try ice and compression as well  Follow up with me in 4-6 weeks if your knee pain continues.   How to Perform the Epley Maneuver The Epley maneuver is an exercise that relieves symptoms of vertigo. Vertigo is the feeling that you or your surroundings are moving when they are not. When you feel vertigo, you may feel like the room is spinning and have trouble walking. Dizziness is a little different than vertigo. When you are dizzy, you may feel unsteady or light-headed. You can do this maneuver at home whenever you have symptoms of vertigo. You can do it up to 3 times a day until your symptoms go away. Even though the Epley maneuver may relieve your vertigo for a few weeks, it is possible that your symptoms will return. This maneuver relieves vertigo, but it does not relieve dizziness. What are the risks? If it is done correctly, the Epley maneuver is considered safe. Sometimes it can lead to dizziness or nausea that goes away after a short time. If you develop other symptoms, such as changes in vision, weakness, or numbness, stop doing the maneuver and call your health care provider. How to perform the Epley maneuver 1. Sit on the edge of a bed or table with your back straight and your legs extended or hanging over the edge of the bed or table. 2. Turn your head halfway toward the affected ear or side. 3. Lie backward quickly with your head turned until you are lying flat on your back. You may want to position a pillow under your shoulders. 4. Hold this position for 30 seconds. You may experience an attack of vertigo. This is normal. 5. Turn your head to the opposite direction until your unaffected ear is facing the floor. 6. Hold this position for 30 seconds. You may experience an attack of vertigo. This is normal. Hold this position until the vertigo stops. 7. Turn your whole body to the same side as your  head. Hold for another 30 seconds. 8. Sit back up. You can repeat this exercise up to 3 times a day. Follow these instructions at home:  After doing the Epley maneuver, you can return to your normal activities.  Ask your health care provider if there is anything you should do at home to prevent vertigo. He or she may recommend that you: ? Keep your head raised (elevated) with two or more pillows while you sleep. ? Do not sleep on the side of your affected ear. ? Get up slowly from bed. ? Avoid sudden movements during the day. ? Avoid extreme head movement, like looking up or bending over. Contact a health care provider if:  Your vertigo gets worse.  You have other symptoms, including: ? Nausea. ? Vomiting. ? Headache. Get help right away if:  You have vision changes.  You have a severe or worsening headache or neck pain.  You cannot stop vomiting.  You have new numbness or weakness in any part of your body. Summary  Vertigo is the feeling that you or your surroundings are moving when they are not.  The Epley maneuver is an exercise that relieves symptoms of vertigo.  If the Epley maneuver is done correctly, it is considered safe. You can do it up to 3 times a day. This information is not intended to replace advice given to you by your health care provider.  Make sure you discuss any questions you have with your health care provider. Document Released: 11/09/2013 Document Revised: 09/24/2016 Document Reviewed: 09/24/2016 Elsevier Interactive Patient Education  2017 [employer].

## 2018-03-17 NOTE — Telephone Encounter (Signed)
Spoke with patient about her results. Will try to pass stool to see if that improves her pain.   Rosemarie Ax, MD [facility] 03/17/2018, 5:23 PM

## 2018-03-17 NOTE — Assessment & Plan Note (Signed)
Possibly related to constipation.  She seems to have intermittent bowel movements.  Pain is centralized with no radiation to the back.  Possible for pancreatitis.  Possible for ulcer formation if she gets steroid injections into her head.  History of H pylori several years ago.  - CMP, CBC, amylase and lipase  - ab xray  - Continue prevacid.

## 2018-03-17 NOTE — Assessment & Plan Note (Signed)
Pain related to OA. 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

## 2018-03-17 NOTE — Progress Notes (Signed)
Tracie Hart - 62 y.o. female MRN [id number]  Date of birth: 09-07-1956  SUBJECTIVE:  Including CC & ROS.  Chief Complaint  Patient presents with  . Dizziness  . Abdominal Pain    Tracie Hart is a 62 y.o. female that is presenting with dizziness. She noticed symptoms one month ago when she woke up in the morning. Denies vision issues and light sensitivity. Dizziness is intermittent. Denies activity that exacerbate dizziness. Denies headaches.   Abdominal pain has been intermittent for the past two months. Located upper quadrant. Admits to nausea, vomiting and diarrhea over the past two months.Denies food that trigger her symptoms.  She feels like her abdomen is bloated. Decrease appetite. Denies fevers. Admits to acid reflux.  Left knee pain is chronic in nature.  She has a history of osteoarthritis.  She was living in [place] and had an MRI that showed this.  She has pain that is intermittent and localized to the knee.  The pain can be mild to severe in nature.  She denies any recent injuries.  She has not been taking anything for the pain.  The pain swells intermittently.   Review of Systems  Constitutional: Negative for fever.  HENT: Negative for congestion.   Respiratory: Negative for cough.   Cardiovascular: Negative for chest pain.  Gastrointestinal: Positive for abdominal pain, diarrhea and nausea.  Musculoskeletal: Negative for gait problem.  Skin: Negative for color change.  Neurological: Positive for dizziness.  Hematological: Negative for adenopathy.  Psychiatric/Behavioral: Negative for agitation.    HISTORY: Past Medical, Surgical, Social, and Family History Reviewed & Updated per EMR.   Pertinent Historical Findings include:  Past Medical History:  Diagnosis Date  . Depression   . GERD (gastroesophageal reflux disease)   . Migraine   . Thyroid disease     Past Surgical History:  Procedure Laterality Date  . GALLBLADDER SURGERY    . TONSILLECTOMY       Allergies  Allergen Reactions  . Codeine Itching    Family History  Problem Relation Age of Onset  . Arthritis Mother   . Diabetes Mother   . Heart disease Mother   . Hypertension Mother   . Stroke Mother   . Arthritis Father   . Depression Father   . Diabetes Father   . Hyperlipidemia Father   . Hypertension Father   . Depression Brother   . Hypertension Brother   . Heart disease Maternal Grandmother   . COPD Paternal Grandmother   . COPD Paternal Grandfather      Social History   Socioeconomic History  . Marital status: Divorced    Spouse name: Not on file  . Number of children: Not on file  . Years of education: Not on file  . Highest education level: Not on file  Occupational History  . Not on file  Social Needs  . Financial resource strain: Not on file  . Food insecurity:    Worry: Not on file    Inability: Not on file  . Transportation needs:    Medical: Not on file    Non-medical: Not on file  Tobacco Use  . Smoking status: Never Smoker  . Smokeless tobacco: Never Used  Substance and Sexual Activity  . Alcohol use: Yes  . Drug use: No  . Sexual activity: Never  Lifestyle  . Physical activity:    Days per week: Not on file    Minutes per session: Not on file  . Stress: Not on file  Relationships  . Social connections:    Talks on phone: Not on file    Gets together: Not on file    Attends religious service: Not on file    Active member of club or organization: Not on file    Attends meetings of clubs or organizations: Not on file    Relationship status: Not on file  . Intimate partner violence:    Fear of current or ex partner: Not on file    Emotionally abused: Not on file    Physically abused: Not on file    Forced sexual activity: Not on file  Other Topics Concern  . Not on file  Social History Narrative   No regular exercise     PHYSICAL EXAM:  VS: BP 118/62 (BP Location: Right Arm, Patient Position: Lying right side, Cuff  Size: Small)   Pulse 71   Temp 99.1 F (37.3 C) (Oral)   Ht 5' 4.5" (1.638 m)   Wt 145 lb (65.8 kg)   SpO2 97%   BMI 24.50 kg/m  Physical Exam Gen: NAD, alert, cooperative with exam, well-appearing ENT: normal lips, normal nasal mucosa,  Eye: normal EOM, normal conjunctiva and lids CV:  no edema, +2 pedal pulses, S1-S2 Resp: no accessory muscle use, non-labored, clear to auscultation bilaterally GI: no masses, mild tenderness in the upper quadrant, normal bowel sounds, nondistended, no rebound tenderness, no hernia  Skin: no rashes, no areas of induration  Neuro: normal tone, normal sensation to touch Psych:  normal insight, alert and oriented MSK:  Left knee: No obvious effusion. Normal knee flexion and extension. No instability. Normal gait. Neurovascular intact     ASSESSMENT & PLAN:   Chronic pain of left knee Pain related to OA. 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 -If no improvement consider Antivert or referral to physical therapy.  Pain of upper abdomen Possibly related to constipation.  She seems to have intermittent bowel movements.  Pain is centralized with no radiation to the back.  Possible for pancreatitis.  Possible for ulcer formation if she gets steroid injections into her head.  History of H pylori several years ago.  - CMP, CBC, amylase and lipase  - ab xray  - Continue prevacid.

## 2018-04-15 DIAGNOSIS — G518 Other disorders of facial nerve: Secondary | ICD-10-CM | POA: Diagnosis not present

## 2018-04-15 DIAGNOSIS — G43719 Chronic migraine without aura, intractable, without status migrainosus: Secondary | ICD-10-CM | POA: Diagnosis not present

## 2018-04-15 DIAGNOSIS — M542 Cervicalgia: Secondary | ICD-10-CM | POA: Diagnosis not present

## 2018-04-15 DIAGNOSIS — M791 Myalgia, unspecified site: Secondary | ICD-10-CM | POA: Diagnosis not present

## 2018-04-20 NOTE — Progress Notes (Deleted)
Subjective:    Patient ID: Tracie Hart, female    DOB: June 29, 1956, 62 y.o.   MRN: [id number]  HPI The patient is here for an acute visit.   Right ankle pain:     Medications and allergies reviewed with patient and updated if appropriate.  Patient Active Problem List   Diagnosis Date Noted  . Dizziness 03/17/2018  . Pain of upper abdomen 03/17/2018  . Chronic pain of left knee 03/17/2018  . Cyst of skin 01/05/2018  . Epigastric pain 01/05/2018  . Hypothyroidism 11/03/2017  . Depression 11/03/2017  . Family history of diabetes mellitus (DM) 11/03/2017  . Migraines 11/03/2017  . GERD (gastroesophageal reflux disease) 11/03/2017  . OSA (obstructive sleep apnea) 11/03/2017  . Elevated blood pressure reading 11/03/2017  . Hyperlipidemia 11/03/2017  . Overactive bladder 11/03/2017    Current Outpatient Medications on File Prior to Visit  Medication Sig Dispense Refill  . buPROPion (WELLBUTRIN XL) 150 MG 24 hr tablet Take 1 tablet (150 mg total) by mouth daily. 30 tablet 5  . Diclofenac Sodium (PENNSAID) 2 % SOLN Place 1 application onto the skin 2 (two) times daily. 1 Bottle 3  . escitalopram (LEXAPRO) 10 MG tablet Take by mouth.    . escitalopram (LEXAPRO) 20 MG tablet Take 1 tablet (20 mg total) by mouth daily. 90 tablet 3  . lansoprazole (PREVACID) 30 MG capsule Take 1 capsule (30 mg total) by mouth daily at 12 noon. 90 capsule 3  . levothyroxine (SYNTHROID, LEVOTHROID) 50 MCG tablet Take 1 tablet (50 mcg total) by mouth daily before breakfast. 90 tablet 3  . SUMAtriptan (IMITREX) 100 MG tablet Take 1 tablet (100 mg total) by mouth every 2 (two) hours as needed for migraine. May repeat in 2 hours if headache persists or recurs. 10 tablet 0   No current facility-administered medications on file prior to visit.     Past Medical History:  Diagnosis Date  . Depression   . GERD (gastroesophageal reflux disease)   . Migraine   . Thyroid disease     Past Surgical  History:  Procedure Laterality Date  . GALLBLADDER SURGERY    . TONSILLECTOMY      Social History   Socioeconomic History  . Marital status: Divorced    Spouse name: Not on file  . Number of children: Not on file  . Years of education: Not on file  . Highest education level: Not on file  Occupational History  . Not on file  Social Needs  . Financial resource strain: Not on file  . Food insecurity:    Worry: Not on file    Inability: Not on file  . Transportation needs:    Medical: Not on file    Non-medical: Not on file  Tobacco Use  . Smoking status: Never Smoker  . Smokeless tobacco: Never Used  Substance and Sexual Activity  . Alcohol use: Yes  . Drug use: No  . Sexual activity: Never  Lifestyle  . Physical activity:    Days per week: Not on file    Minutes per session: Not on file  . Stress: Not on file  Relationships  . Social connections:    Talks on phone: Not on file    Gets together: Not on file    Attends religious service: Not on file    Active member of club or organization: Not on file    Attends meetings of clubs or organizations: Not on file  Relationship status: Not on file  Other Topics Concern  . Not on file  Social History Narrative   No regular exercise    Family History  Problem Relation Age of Onset  . Arthritis Mother   . Diabetes Mother   . Heart disease Mother   . Hypertension Mother   . Stroke Mother   . Arthritis Father   . Depression Father   . Diabetes Father   . Hyperlipidemia Father   . Hypertension Father   . Depression Brother   . Hypertension Brother   . Heart disease Maternal Grandmother   . COPD Paternal Grandmother   . COPD Paternal Grandfather     Review of Systems     Objective:  There were no vitals filed for this visit. BP Readings from Last 3 Encounters:  03/17/18 118/62  01/05/18 (!) 140/98  11/03/17 (!) 142/94   Wt Readings from Last 3 Encounters:  03/17/18 145 lb (65.8 kg)  01/05/18 148 lb  (67.1 kg)  11/03/17 148 lb (67.1 kg)   There is no height or weight on file to calculate BMI.   Physical Exam         Assessment & Plan:    See Problem List for Assessment and Plan of chronic medical problems.

## 2018-04-21 ENCOUNTER — Ambulatory Visit: Payer: BLUE CROSS/BLUE SHIELD | Admitting: Internal Medicine

## 2018-04-21 DIAGNOSIS — Z0289 Encounter for other administrative examinations: Secondary | ICD-10-CM

## 2018-04-23 DIAGNOSIS — M791 Myalgia, unspecified site: Secondary | ICD-10-CM | POA: Diagnosis not present

## 2018-04-23 DIAGNOSIS — M542 Cervicalgia: Secondary | ICD-10-CM | POA: Diagnosis not present

## 2018-04-23 DIAGNOSIS — G43719 Chronic migraine without aura, intractable, without status migrainosus: Secondary | ICD-10-CM | POA: Diagnosis not present

## 2018-04-23 DIAGNOSIS — G518 Other disorders of facial nerve: Secondary | ICD-10-CM | POA: Diagnosis not present

## 2018-04-29 DIAGNOSIS — M542 Cervicalgia: Secondary | ICD-10-CM | POA: Diagnosis not present

## 2018-04-29 DIAGNOSIS — G43719 Chronic migraine without aura, intractable, without status migrainosus: Secondary | ICD-10-CM | POA: Diagnosis not present

## 2018-04-29 DIAGNOSIS — M791 Myalgia, unspecified site: Secondary | ICD-10-CM | POA: Diagnosis not present

## 2018-04-29 DIAGNOSIS — G518 Other disorders of facial nerve: Secondary | ICD-10-CM | POA: Diagnosis not present

## 2018-05-05 ENCOUNTER — Ambulatory Visit: Payer: BLUE CROSS/BLUE SHIELD | Admitting: Internal Medicine

## 2018-05-06 DIAGNOSIS — M25571 Pain in right ankle and joints of right foot: Secondary | ICD-10-CM | POA: Diagnosis not present

## 2018-05-06 DIAGNOSIS — M67961 Unspecified disorder of synovium and tendon, right lower leg: Secondary | ICD-10-CM | POA: Diagnosis not present

## 2018-05-07 NOTE — Patient Instructions (Addendum)
  Medications reviewed and updated.  No changes recommended at this time.     Please followup in 6 months   

## 2018-05-07 NOTE — Progress Notes (Signed)
Subjective:    Patient ID: Tracie Hart, female    DOB: Jun 29, 1956, 62 y.o.   MRN: [id number]  HPI The patient is here for follow up.  Depression: she is still taking lexapro 30 mg daily - she did not decrease to 20 mg and start the wellbutrin.  She still struggles with some depression, but feels it is currently controlled.  She is still going through the separation, which is difficult.  She sometimes has difficulty sleeping.  She was exercising more regularly except for the past couple of weeks, but plans on getting back to regular exercise.  Regular exercise does make a big difference.  She is seeing a therapist.  GERD: She is taking the Prevacid daily.  4 months ago we discussed adding Zantac to see if that improved her GERD, which she thought was controlled and also help with her epigastric discomfort she was experiencing at that time.  She is currently not taking the Zantac.  She does feel her heartburn is controlled as long as she takes the Prevacid daily.  She is always tired.  Her sleep is not refreshing.  She has OSA-she has been tested in the past.  She is using an oral device.  She is waking up tired.  She wondered if the device was actually working.  Medications and allergies reviewed with patient and updated if appropriate.  Patient Active Problem List   Diagnosis Date Noted  . Dizziness 03/17/2018  . Pain of upper abdomen 03/17/2018  . Chronic pain of left knee 03/17/2018  . Multiple lipomas 01/05/2018  . Epigastric pain 01/05/2018  . Hypothyroidism 11/03/2017  . Depression 11/03/2017  . Family history of diabetes mellitus (DM) 11/03/2017  . Migraines 11/03/2017  . GERD (gastroesophageal reflux disease) 11/03/2017  . OSA (obstructive sleep apnea) 11/03/2017  . Elevated blood pressure reading 11/03/2017  . Hyperlipidemia 11/03/2017  . Overactive bladder 11/03/2017    Current Outpatient Medications on File Prior to Visit  Medication Sig Dispense Refill  . buPROPion  (WELLBUTRIN XL) 150 MG 24 hr tablet Take 1 tablet (150 mg total) by mouth daily. 30 tablet 5  . Diclofenac Sodium (PENNSAID) 2 % SOLN Place 1 application onto the skin 2 (two) times daily. 1 Bottle 3  . escitalopram (LEXAPRO) 20 MG tablet Take 1 tablet (20 mg total) by mouth daily. 90 tablet 3  . lansoprazole (PREVACID) 30 MG capsule Take 1 capsule (30 mg total) by mouth daily at 12 noon. 90 capsule 3  . levothyroxine (SYNTHROID, LEVOTHROID) 50 MCG tablet Take 1 tablet (50 mcg total) by mouth daily before breakfast. 90 tablet 3  . Magnesium 400 MG TABS Take 400 mg by mouth daily.    . SUMAtriptan (IMITREX) 100 MG tablet Take 1 tablet (100 mg total) by mouth every 2 (two) hours as needed for migraine. May repeat in 2 hours if headache persists or recurs. 10 tablet 0   No current facility-administered medications on file prior to visit.     Past Medical History:  Diagnosis Date  . Depression   . GERD (gastroesophageal reflux disease)   . Migraine   . Thyroid disease     Past Surgical History:  Procedure Laterality Date  . GALLBLADDER SURGERY    . TONSILLECTOMY      Social History   Socioeconomic History  . Marital status: Divorced    Spouse name: Not on file  . Number of children: Not on file  . Years of education: Not on file  .  Highest education level: Not on file  Occupational History  . Not on file  Social Needs  . Financial resource strain: Not on file  . Food insecurity:    Worry: Not on file    Inability: Not on file  . Transportation needs:    Medical: Not on file    Non-medical: Not on file  Tobacco Use  . Smoking status: Never Smoker  . Smokeless tobacco: Never Used  Substance and Sexual Activity  . Alcohol use: Yes  . Drug use: No  . Sexual activity: Never  Lifestyle  . Physical activity:    Days per week: Not on file    Minutes per session: Not on file  . Stress: Not on file  Relationships  . Social connections:    Talks on phone: Not on file     Gets together: Not on file    Attends religious service: Not on file    Active member of club or organization: Not on file    Attends meetings of clubs or organizations: Not on file    Relationship status: Not on file  Other Topics Concern  . Not on file  Social History Narrative   No regular exercise    Family History  Problem Relation Age of Onset  . Arthritis Mother   . Diabetes Mother   . Heart disease Mother   . Hypertension Mother   . Stroke Mother   . Arthritis Father   . Depression Father   . Diabetes Father   . Hyperlipidemia Father   . Hypertension Father   . Depression Brother   . Hypertension Brother   . Heart disease Maternal Grandmother   . COPD Paternal Grandmother   . COPD Paternal Grandfather     Review of Systems  Constitutional: Positive for fatigue. Negative for chills and fever.  Respiratory: Negative for shortness of breath.   Cardiovascular: Negative for chest pain and palpitations.  Neurological: Negative for headaches.  Psychiatric/Behavioral: Positive for dysphoric mood and sleep disturbance. The patient is not nervous/anxious.        Objective:   Vitals:   05/08/18 0947  BP: 130/84  Pulse: 76  Resp: 16  Temp: 97.6 F (36.4 C)  SpO2: 97%   BP Readings from Last 3 Encounters:  05/08/18 130/84  03/17/18 118/62  01/05/18 (!) 140/98   Wt Readings from Last 3 Encounters:  05/08/18 146 lb (66.2 kg)  03/17/18 145 lb (65.8 kg)  01/05/18 148 lb (67.1 kg)   Body mass index is 24.67 kg/m.   Physical Exam    Constitutional: Appears well-developed and well-nourished. No distress.  HENT:  Head: Normocephalic and atraumatic.  Neck: Neck supple. No tracheal deviation present. No thyromegaly present.  No cervical lymphadenopathy Cardiovascular: Normal rate, regular rhythm and normal heart sounds.   No murmur heard. No carotid bruit .  No edema Pulmonary/Chest: Effort normal and breath sounds normal. No respiratory distress. No has no  wheezes. No rales.  Skin: Skin is warm and dry. Not diaphoretic.  Psychiatric: Normal mood and affect. Behavior is normal.      Assessment & Plan:    See Problem List for Assessment and Plan of chronic medical problems.

## 2018-05-08 ENCOUNTER — Encounter: Payer: Self-pay | Admitting: Internal Medicine

## 2018-05-08 ENCOUNTER — Ambulatory Visit: Payer: BLUE CROSS/BLUE SHIELD | Admitting: Internal Medicine

## 2018-05-08 VITALS — BP 130/84 | HR 76 | Temp 97.6°F | Resp 16 | Wt 146.0 lb

## 2018-05-08 DIAGNOSIS — G4733 Obstructive sleep apnea (adult) (pediatric): Secondary | ICD-10-CM

## 2018-05-08 DIAGNOSIS — R5383 Other fatigue: Secondary | ICD-10-CM | POA: Insufficient documentation

## 2018-05-08 DIAGNOSIS — F3289 Other specified depressive episodes: Secondary | ICD-10-CM | POA: Diagnosis not present

## 2018-05-08 DIAGNOSIS — K219 Gastro-esophageal reflux disease without esophagitis: Secondary | ICD-10-CM | POA: Diagnosis not present

## 2018-05-08 NOTE — Assessment & Plan Note (Signed)
Sleep is not refreshing - ? Oral device working Consider re-evaluation of OSA with pulm Had blood work done recently by neuro - no explanation for fatigue

## 2018-05-08 NOTE — Assessment & Plan Note (Signed)
Fairly controlled Still taking Prevacid daily Will continue the Prevacid since if she does not take it she is symptomatic If symptoms are not controlled consider adding Zantac

## 2018-05-08 NOTE — Assessment & Plan Note (Signed)
Using a mouth device She is experiencing increased fatigue She is having poor sleep quality, which is likely related to depression and life changes, which is likely contributing to fatigue, but concerned over if the mouth device is working or not Discussed referral to pulmonary - having her sleep apnea reevaluated with using the oral device

## 2018-05-08 NOTE — Assessment & Plan Note (Signed)
Controlled with Prevacid daily-continue

## 2018-05-27 DIAGNOSIS — M542 Cervicalgia: Secondary | ICD-10-CM | POA: Diagnosis not present

## 2018-05-27 DIAGNOSIS — G518 Other disorders of facial nerve: Secondary | ICD-10-CM | POA: Diagnosis not present

## 2018-05-27 DIAGNOSIS — M791 Myalgia, unspecified site: Secondary | ICD-10-CM | POA: Diagnosis not present

## 2018-05-27 DIAGNOSIS — G43719 Chronic migraine without aura, intractable, without status migrainosus: Secondary | ICD-10-CM | POA: Diagnosis not present

## 2018-06-29 DIAGNOSIS — R2232 Localized swelling, mass and lump, left upper limb: Secondary | ICD-10-CM | POA: Diagnosis not present

## 2018-06-29 DIAGNOSIS — M79645 Pain in left finger(s): Secondary | ICD-10-CM | POA: Diagnosis not present

## 2018-06-29 DIAGNOSIS — D1721 Benign lipomatous neoplasm of skin and subcutaneous tissue of right arm: Secondary | ICD-10-CM | POA: Diagnosis not present

## 2018-07-01 ENCOUNTER — Encounter: Payer: Self-pay | Admitting: Pulmonary Disease

## 2018-07-01 ENCOUNTER — Ambulatory Visit: Payer: BLUE CROSS/BLUE SHIELD | Admitting: Pulmonary Disease

## 2018-07-01 VITALS — BP 110/72 | HR 66 | Ht 65.0 in | Wt 147.0 lb

## 2018-07-01 DIAGNOSIS — G4733 Obstructive sleep apnea (adult) (pediatric): Secondary | ICD-10-CM | POA: Diagnosis not present

## 2018-07-01 DIAGNOSIS — F3289 Other specified depressive episodes: Secondary | ICD-10-CM

## 2018-07-01 NOTE — Progress Notes (Signed)
Tracie Hart    [id number]    September 26, 1956  Primary Care Physician:Burns, Claudina Lick, MD  Referring Physician: Binnie Rail, MD [place],  [id number]  Chief complaint:   History of obstructive sleep apnea with history of difficulty tolerating CPAP therapy secondary to interface, and oral device was fashioned which is not helping at present  HPI:  Diagnosed with obstructive sleep apnea few years back, she did try CPAP for a while, she was not able to tolerate CPAP secondary to the interface, and oral device was fashioned which she has been using Continues to have significant symptoms of sleep disordered breathing Has had a lot of stressors recently including being separated, relocation Has not been sleeping well, remains fatigued Usually goes to bed about 9-10 PM, may take about 1 to 2 hours to fall asleep  She has chronic headaches, migraines Occupation: No pertinent occupational history Exposures: No significant exposures Smoking history: Never smoker Travel history: No significant recent   Outpatient Encounter Medications as of 07/01/2018  Medication Sig  . calcium-vitamin D (OSCAL WITH D) 500-200 MG-UNIT tablet Take 1 tablet by mouth.  . escitalopram (LEXAPRO) 20 MG tablet Take 1 tablet (20 mg total) by mouth daily.  . lansoprazole (PREVACID) 30 MG capsule Take 1 capsule (30 mg total) by mouth daily at 12 noon.  Marland Kitchen levothyroxine (SYNTHROID, LEVOTHROID) 50 MCG tablet Take 1 tablet (50 mcg total) by mouth daily before breakfast.  . Magnesium 400 MG TABS Take 400 mg by mouth daily.  . SUMAtriptan (IMITREX) 100 MG tablet Take 1 tablet (100 mg total) by mouth every 2 (two) hours as needed for migraine. May repeat in 2 hours if headache persists or recurs.  . vitamin B-12 (CYANOCOBALAMIN) 500 MCG tablet Take 500 mcg by mouth daily.  . [DISCONTINUED] buPROPion (WELLBUTRIN XL) 150 MG 24 hr tablet Take 1 tablet (150 mg total) by mouth daily.  . [DISCONTINUED]  Diclofenac Sodium (PENNSAID) 2 % SOLN Place 1 application onto the skin 2 (two) times daily.   No facility-administered encounter medications on file as of 07/01/2018.     Allergies as of 07/01/2018 - Review Complete 07/01/2018  Allergen Reaction Noted  . Codeine Itching 03/17/2018    Past Medical History:  Diagnosis Date  . Depression   . GERD (gastroesophageal reflux disease)   . Migraine   . Thyroid disease     Past Surgical History:  Procedure Laterality Date  . GALLBLADDER SURGERY    . TONSILLECTOMY      Family History  Problem Relation Age of Onset  . Arthritis Mother   . Diabetes Mother   . Heart disease Mother   . Hypertension Mother   . Stroke Mother   . Arthritis Father   . Depression Father   . Diabetes Father   . Hyperlipidemia Father   . Hypertension Father   . Depression Brother   . Hypertension Brother   . Heart disease Maternal Grandmother   . COPD Paternal Grandmother   . COPD Paternal Grandfather     Social History   Socioeconomic History  . Marital status: Divorced    Spouse name: Not on file  . Number of children: Not on file  . Years of education: Not on file  . Highest education level: Not on file  Occupational History  . Not on file  Social Needs  . Financial resource strain: Not on file  . Food insecurity:  Worry: Not on file    Inability: Not on file  . Transportation needs:    Medical: Not on file    Non-medical: Not on file  Tobacco Use  . Smoking status: Never Smoker  . Smokeless tobacco: Never Used  Substance and Sexual Activity  . Alcohol use: Yes  . Drug use: No  . Sexual activity: Never  Lifestyle  . Physical activity:    Days per week: Not on file    Minutes per session: Not on file  . Stress: Not on file  Relationships  . Social connections:    Talks on phone: Not on file    Gets together: Not on file    Attends religious service: Not on file    Active member of club or organization: Not on file     Attends meetings of clubs or organizations: Not on file    Relationship status: Not on file  . Intimate partner violence:    Fear of current or ex partner: Not on file    Emotionally abused: Not on file    Physically abused: Not on file    Forced sexual activity: Not on file  Other Topics Concern  . Not on file  Social History Narrative   No regular exercise    Review of systems: Review of Systems  Constitutional: Negative for fever and chills.  Significant fatigue HENT: Negative.  Soreness on her gums    Eyes: Negative for blurred vision.  Respiratory: as per HPI  Cardiovascular: Negative for chest pain and palpitations.  Gastrointestinal: Negative for vomiting, diarrhea, blood per rectum. Genitourinary: Negative for dysuria, urgency, frequency and hematuria.  Musculoskeletal: Negative for myalgias, back pain and joint pain.  Skin: Negative for itching and rash.  Neurological: Negative for dizziness, tremors, focal weakness, seizures and loss of consciousness.  Endo/Heme/Allergies: Negative for environmental allergies.  Psychiatric/Behavioral: History of depression.  All other systems reviewed and are negative.  Physical Exam:  Vitals:   07/01/18 0930  BP: 110/72  Pulse: 66  SpO2: 98%   Gen:      No acute distress HEENT:  EOMI, sclera anicteric oropharynx is crowded Neck:     No masses; no thyromegaly Lungs:    Clear to auscultation bilaterally; normal respiratory effort CV:         Regular rate and rhythm; no murmurs Abd:      + bowel sounds; soft, non-tender; no palpable masses, no distension Ext:    No edema; adequate peripheral perfusion Skin:      Warm and dry; no rash Neuro: alert and oriented x 3 Psych: normal mood and affect  Data Reviewed: Marland Kitchen  Obstructive sleep apnea  .  Daytime sleepiness   Assessment:  .  Obstructive sleep apnea is not well treated at the present time Did not tolerate CPAP well in the past-she is willing to try it again Oral device  does not seem to be working well at the present time, she is having some dentition changes and soreness  Options of treatment were discussed with the patient--she does not want to consider surgical intervention, options  . Plan at the present time will be to obtain a copy of our recent study I encouraged her to get set up with an orthodontist to review her current oral device-an option to perform an in-home sleep study with the oral device in place was also discussed--she is compliant with its use but not having any symptomatic improvement She is willing to try CPAP  again  She will bring her machine from [facility] which she recently moved We will try and set up with a DME company for mask fitting  We will obtain downloads from the machine  If still not working well , we will plan a split-night study  She is working on the stressors she is dealing with at present  Sherrilyn Rist MD [facility] 07/01/2018, 10:04 AM  CC: Binnie Rail, MD

## 2018-07-01 NOTE — Patient Instructions (Signed)
Obstructive sleep apnea currently on oral device  Symptoms are still pronounced  Dentition changes  Did not tolerate CPAP well in the past   Obtain copy of previous study  Set you up with a DME company following receiving the previous study-for mask fitting  Trial with a CPAP for couple of weeks  If this is not helping, then we will set you up for a split-night study  Encouraged to seek evaluation by an orthodontist  I will see you back about 4 to 6 weeks following reinitiation of CPAP

## 2018-07-03 ENCOUNTER — Telehealth: Payer: Self-pay | Admitting: Pulmonary Disease

## 2018-07-03 DIAGNOSIS — G4733 Obstructive sleep apnea (adult) (pediatric): Secondary | ICD-10-CM

## 2018-07-03 NOTE — Telephone Encounter (Signed)
Obtained report from [facility]  Sleep study did reveal mild to moderate obstructive sleep apnea with an AHI of 9.5, supine index of 35.3  Treated with CPAP  CPAP of 8 with an EPR of 3  DME referral to have her machine set at above

## 2018-07-21 DIAGNOSIS — G518 Other disorders of facial nerve: Secondary | ICD-10-CM | POA: Diagnosis not present

## 2018-07-21 DIAGNOSIS — M791 Myalgia, unspecified site: Secondary | ICD-10-CM | POA: Diagnosis not present

## 2018-07-21 DIAGNOSIS — G43719 Chronic migraine without aura, intractable, without status migrainosus: Secondary | ICD-10-CM | POA: Diagnosis not present

## 2018-07-21 DIAGNOSIS — M542 Cervicalgia: Secondary | ICD-10-CM | POA: Diagnosis not present

## 2018-07-27 ENCOUNTER — Ambulatory Visit: Payer: Self-pay | Admitting: Internal Medicine

## 2018-07-27 DIAGNOSIS — G4733 Obstructive sleep apnea (adult) (pediatric): Secondary | ICD-10-CM | POA: Diagnosis not present

## 2018-07-27 NOTE — Progress Notes (Signed)
Subjective:    Patient ID: Tracie Hart, female    DOB: Jul 16, 1956, 62 y.o.   MRN: [id number]  HPI The patient is here for an acute visit for 3-4 days of nausea, vomiting, diarrhea and dizziness. .  She had her gallbladder out years ago.  Since that time she has had intermittent episodes of nausea, vomiting and loose stools, but they occur only every few months.  The past few months she has had a few episodes.  Typically her symptoms resolve quickly, but the past few episodes they have lingered for a few days.  2 weeks ago she had an episode that she thinks occurred after eating pizza.  She wondered if it was because it was some of the anxiety that caused the symptoms.  Her most recent episode was 2 days ago.  She went to go to the bathroom to have a bowel movement and had a loose stool.  She had a large amount of stool and feel like she completely clear herself out.  She had upper abdominal cramping and nausea.  She vomited 5 times.  There is no blood in the stool or the vomit.  Yesterday she had a very small bowel movement and no bowel movement today.  She continues to have upper abdominal cramping and some nausea, but has not vomited since 2 days ago.  She did also have a milkshake prior to that episode.  She was unsure if it was the food she was eating.  Today she is eating very healthy and has not had any dairy.  Overall eats healthy and has lost some weight.  .   In the past she thinks red meat has triggered it.  She was concerned about what was causing it and what she needed to do.  She feels slightly better today, but still does not feel well.  Dizziness: She continues to have some dizziness.  It tends to occur when she gets up in the morning or changes position.  She denies it when she rolls over or with head movements.  It typically last few days and is mild.  She does not want to take any medication or do physical therapy.  She was seen here in the office by Dr. Raeford Razor.  She  denies any associated symptoms with the dizziness.      Medications and allergies reviewed with patient and updated if appropriate.  Patient Active Problem List   Diagnosis Date Noted  . Fatigue 05/08/2018  . Dizziness 03/17/2018  . Pain of upper abdomen 03/17/2018  . Chronic pain of left knee 03/17/2018  . Multiple lipomas 01/05/2018  . Hypothyroidism 11/03/2017  . Depression 11/03/2017  . Family history of diabetes mellitus (DM) 11/03/2017  . Migraines 11/03/2017  . GERD (gastroesophageal reflux disease) 11/03/2017  . OSA (obstructive sleep apnea) 11/03/2017  . Hyperlipidemia 11/03/2017  . Overactive bladder 11/03/2017    Current Outpatient Medications on File Prior to Visit  Medication Sig Dispense Refill  . calcium-vitamin D (OSCAL WITH D) 500-200 MG-UNIT tablet Take 1 tablet by mouth.    . escitalopram (LEXAPRO) 20 MG tablet Take 1 tablet (20 mg total) by mouth daily. 90 tablet 3  . lansoprazole (PREVACID) 30 MG capsule Take 1 capsule (30 mg total) by mouth daily at 12 noon. 90 capsule 3  . levothyroxine (SYNTHROID, LEVOTHROID) 50 MCG tablet Take 1 tablet (50 mcg total) by mouth daily before breakfast. 90 tablet 3  . Magnesium 400 MG TABS Take 400 mg by  mouth daily.    . SUMAtriptan (IMITREX) 100 MG tablet Take 1 tablet (100 mg total) by mouth every 2 (two) hours as needed for migraine. May repeat in 2 hours if headache persists or recurs. 10 tablet 0  . vitamin B-12 (CYANOCOBALAMIN) 500 MCG tablet Take 500 mcg by mouth daily.     No current facility-administered medications on file prior to visit.     Past Medical History:  Diagnosis Date  . Depression   . GERD (gastroesophageal reflux disease)   . Migraine   . Thyroid disease     Past Surgical History:  Procedure Laterality Date  . GALLBLADDER SURGERY    . TONSILLECTOMY      Social History   Socioeconomic History  . Marital status: Legally Separated    Spouse name: Not on file  . Number of children: Not  on file  . Years of education: Not on file  . Highest education level: Not on file  Occupational History  . Not on file  Social Needs  . Financial resource strain: Not on file  . Food insecurity:    Worry: Not on file    Inability: Not on file  . Transportation needs:    Medical: Not on file    Non-medical: Not on file  Tobacco Use  . Smoking status: Never Smoker  . Smokeless tobacco: Never Used  Substance and Sexual Activity  . Alcohol use: Yes  . Drug use: No  . Sexual activity: Never  Lifestyle  . Physical activity:    Days per week: Not on file    Minutes per session: Not on file  . Stress: Not on file  Relationships  . Social connections:    Talks on phone: Not on file    Gets together: Not on file    Attends religious service: Not on file    Active member of club or organization: Not on file    Attends meetings of clubs or organizations: Not on file    Relationship status: Not on file  Other Topics Concern  . Not on file  Social History Narrative   No regular exercise    Family History  Problem Relation Age of Onset  . Arthritis Mother   . Diabetes Mother   . Heart disease Mother   . Hypertension Mother   . Stroke Mother   . Arthritis Father   . Depression Father   . Diabetes Father   . Hyperlipidemia Father   . Hypertension Father   . Depression Brother   . Hypertension Brother   . Heart disease Maternal Grandmother   . COPD Paternal Grandmother   . COPD Paternal Grandfather     Review of Systems  Constitutional: Negative for chills and fever.  Gastrointestinal: Positive for abdominal pain, nausea and vomiting. Negative for blood in stool and diarrhea (loose stools).       Gerd controlled  Neurological: Positive for dizziness.       Objective:   Vitals:   07/28/18 1501  BP: (!) 142/94  Pulse: 77  Resp: 16  Temp: 99.2 F (37.3 C)  SpO2: 96%   BP Readings from Last 3 Encounters:  07/28/18 (!) 142/94  07/01/18 110/72  05/08/18 130/84    Wt Readings from Last 3 Encounters:  07/28/18 143 lb 12.8 oz (65.2 kg)  07/01/18 147 lb (66.7 kg)  05/08/18 146 lb (66.2 kg)   Body mass index is 23.93 kg/m.   Physical Exam  Constitutional: She appears well-developed  and well-nourished. She does not appear ill. No distress.  HENT:  Head: Normocephalic and atraumatic.  Abdominal: Soft. Normal appearance. She exhibits no distension. There is no hepatosplenomegaly. There is tenderness (Minimal across upper abdomen). There is no rigidity, no rebound, no guarding, no tenderness at McBurney's point and negative Murphy's sign. No hernia.  Skin: Skin is warm and dry.           Assessment & Plan:    See Problem List for Assessment and Plan of chronic medical problems.

## 2018-07-27 NOTE — Telephone Encounter (Signed)
Called in c/o being dizzy when she gets up from bed for the last 3-4 days.  Stomach has been hurting for 2 days.  She is having diarrhea and yesterday she vomited a lot. She has had her gallbladder removed 5 years ago and has had episodes that this before but this time it seems worse and is lasting longer.   The dizziness is mainly when she gets up from bed but does occur intermittently at other times.   Denies it interfering with her daily activities.   She is getting ready to go to her part time job as we speak.  She also requested to be seen after 4:00 today or tomorrow if possible due to work schedule.  I scheduled her with Dr. Quay Burow for 07/28/18 at 3:15.   She can make this appt time.    Reason for Disposition . [1] MILD dizziness (e.g., walking normally) AND [2] has been evaluated by physician for this  Answer Assessment - Initial Assessment Questions 1. DESCRIPTION: "Describe your dizziness."     I had gallbladder out 5 years ago.   When go to have  BM I would throw up.   I've had 2 episodes yesterday.   I'm having stomach pain for 2 days.   I'm having real loose stools.   Yesterday I  Vomited a lot.   Last 3-4 days I'm dizzy when I get up.   I've been drinking a lot of water. 2. LIGHTHEADED: "Do you feel lightheaded?" (e.g., somewhat faint, woozy, weak upon standing)     Dizziness is intermittent.    3. VERTIGO: "Do you feel like either you or the room is spinning or tilting?" (i.e. vertigo)     No 4. SEVERITY: "How bad is it?"  "Do you feel like you are going to faint?" "Can you stand and walk?"   - MILD - walking normally   - MODERATE - interferes with normal activities (e.g., work, school)    - SEVERE - unable to stand, requires support to walk, feels like passing out now.      No  5. ONSET:  "When did the dizziness begin?"     3-4 days ago 6. AGGRAVATING FACTORS: "Does anything make it worse?" (e.g., standing, change in head position)     Getting up from bed.    I ate some toast a  little while ago and I'm very nauseas.   7. HEART RATE: "Can you tell me your heart rate?" "How many beats in 15 seconds?"  (Note: not all patients can do this)        8. CAUSE: "What do you think is causing the dizziness?"     I have no idea. 9. RECURRENT SYMPTOM: "Have you had dizziness before?" If so, ask: "When was the last time?" "What happened that time?"     Yes 10. OTHER SYMPTOMS: "Do you have any other symptoms?" (e.g., fever, chest pain, vomiting, diarrhea, bleeding)       Having diarrhea and nausea with vomiting a lot yesterday.    I 11. PREGNANCY: "Is there any chance you are pregnant?" "When was your last menstrual period?"       Not asked.  Protocols used: DIZZINESS [facility]

## 2018-07-28 ENCOUNTER — Encounter: Payer: Self-pay | Admitting: Internal Medicine

## 2018-07-28 ENCOUNTER — Ambulatory Visit: Payer: BLUE CROSS/BLUE SHIELD | Admitting: Internal Medicine

## 2018-07-28 VITALS — BP 142/94 | HR 77 | Temp 99.2°F | Resp 16 | Ht 65.0 in | Wt 143.8 lb

## 2018-07-28 DIAGNOSIS — Z1231 Encounter for screening mammogram for malignant neoplasm of breast: Secondary | ICD-10-CM | POA: Diagnosis not present

## 2018-07-28 DIAGNOSIS — R42 Dizziness and giddiness: Secondary | ICD-10-CM | POA: Diagnosis not present

## 2018-07-28 DIAGNOSIS — R195 Other fecal abnormalities: Secondary | ICD-10-CM | POA: Diagnosis not present

## 2018-07-28 DIAGNOSIS — R112 Nausea with vomiting, unspecified: Secondary | ICD-10-CM

## 2018-07-28 NOTE — Assessment & Plan Note (Signed)
She is experiencing intermittent loose stool that is then followed by nausea, vomiting and upper abdominal cramping.  The nausea and cramping is lasting for couple of days She has had these symptoms intermittently for a while, but they have seemed to have gotten worse recently Unsure if red meat, pizza or possible triggers Possible IBS Will refer to GI for further evaluation Deferred hyoscyamine, dicyclomine

## 2018-07-28 NOTE — Patient Instructions (Addendum)
A referral was ordered for GI.  They will contact you to schedule an appointment.    Call and schedule your mammogram -  [facility] 7 a.m.-6:30 p.m., Monday 7 a.m.-5 p.m., Tuesday-Friday Schedule an appointment by calling [phone]

## 2018-07-28 NOTE — Assessment & Plan Note (Signed)
Has been experiencing nausea and vomiting with episodes of loose stool-nausea and vomiting follow the loose stool ?  Trigger some foods such as pizza and red meat Residual nausea and upper abdominal cramping lasting for couple of days She will continue to monitor what foods seem to be causing symptoms Deferred any medication at this time Will refer to GI for further evaluation given that she still having some upper abdominal discomfort

## 2018-07-28 NOTE — Assessment & Plan Note (Signed)
Dizziness is intermittent and tends to occur with changes in position, especially when getting out of bed in the morning She was seen here for this earlier this summer Likely BPPV Symptoms are relatively mild and she defers medication or physical therapy Continue symptomatic treatment She will let us know if this worsens

## 2018-07-29 ENCOUNTER — Encounter: Payer: Self-pay | Admitting: Gastroenterology

## 2018-07-29 DIAGNOSIS — M542 Cervicalgia: Secondary | ICD-10-CM | POA: Diagnosis not present

## 2018-07-29 DIAGNOSIS — G518 Other disorders of facial nerve: Secondary | ICD-10-CM | POA: Diagnosis not present

## 2018-07-29 DIAGNOSIS — M791 Myalgia, unspecified site: Secondary | ICD-10-CM | POA: Diagnosis not present

## 2018-07-29 DIAGNOSIS — G43719 Chronic migraine without aura, intractable, without status migrainosus: Secondary | ICD-10-CM | POA: Diagnosis not present

## 2018-08-03 ENCOUNTER — Ambulatory Visit: Payer: BLUE CROSS/BLUE SHIELD | Admitting: Pulmonary Disease

## 2018-08-20 DIAGNOSIS — H02413 Mechanical ptosis of bilateral eyelids: Secondary | ICD-10-CM | POA: Diagnosis not present

## 2018-08-26 DIAGNOSIS — G4733 Obstructive sleep apnea (adult) (pediatric): Secondary | ICD-10-CM | POA: Diagnosis not present

## 2018-09-03 DIAGNOSIS — Z9889 Other specified postprocedural states: Secondary | ICD-10-CM | POA: Diagnosis not present

## 2018-09-03 DIAGNOSIS — M545 Low back pain: Secondary | ICD-10-CM | POA: Diagnosis not present

## 2018-09-04 ENCOUNTER — Telehealth: Payer: Self-pay

## 2018-09-04 DIAGNOSIS — Z13820 Encounter for screening for osteoporosis: Secondary | ICD-10-CM

## 2018-09-04 DIAGNOSIS — E2839 Other primary ovarian failure: Secondary | ICD-10-CM

## 2018-09-04 NOTE — Telephone Encounter (Signed)
Pt stated that it did not matter where she just wanted it done as soon as she is able to get one.

## 2018-09-04 NOTE — Telephone Encounter (Signed)
Copied from [place] [id number]. Topic: Referral - Request for Referral >> Sep 04, 2018  9:22 AM Burchel, Abbi R wrote: Pt requesting referral for bone density scan.  Pt: [phone] >> Sep 04, 2018 10:07 AM Para Skeans A wrote: LOV: 6/21 for 6 month FU -  NOV:12/20 for CPE - Does she just need to wait until her CPE to have this order put in?

## 2018-09-04 NOTE — Telephone Encounter (Signed)
I can order now - where does she want to get it done - at the [facility] center or here?

## 2018-09-04 NOTE — Telephone Encounter (Signed)
Please call patient to schedule-DEXA ordered

## 2018-09-04 NOTE — Telephone Encounter (Signed)
Appointment made for Monday 10/21.

## 2018-09-07 ENCOUNTER — Telehealth: Payer: Self-pay

## 2018-09-07 ENCOUNTER — Ambulatory Visit: Payer: BLUE CROSS/BLUE SHIELD | Admitting: Gastroenterology

## 2018-09-07 ENCOUNTER — Encounter: Payer: Self-pay | Admitting: Internal Medicine

## 2018-09-07 ENCOUNTER — Ambulatory Visit (INDEPENDENT_AMBULATORY_CARE_PROVIDER_SITE_OTHER)
Admission: RE | Admit: 2018-09-07 | Discharge: 2018-09-07 | Disposition: A | Discharge status: Home / Self Care | Payer: BLUE CROSS/BLUE SHIELD | Source: Ambulatory Visit | Attending: Internal Medicine | Admitting: Internal Medicine

## 2018-09-07 DIAGNOSIS — Z13820 Encounter for screening for osteoporosis: Secondary | ICD-10-CM

## 2018-09-07 DIAGNOSIS — E2839 Other primary ovarian failure: Secondary | ICD-10-CM

## 2018-09-07 NOTE — Telephone Encounter (Signed)
Bone density shows osteopenia  - no other treatment is needed at this time - continue calcium and vitamin d and regular exercise.    Repeat in 2-3 years

## 2018-09-07 NOTE — Telephone Encounter (Signed)
Copied from [place] [id number]. Topic: General - Other >> Sep 07, 2018  1:33 PM Nils Flack, Marland Kitchen wrote: Reason for CRM: pt had a bone density test done today and would like the results as soon as possible.  Please [phone]

## 2018-09-08 ENCOUNTER — Encounter: Payer: Self-pay | Admitting: Internal Medicine

## 2018-09-08 DIAGNOSIS — M8580 Other specified disorders of bone density and structure, unspecified site: Secondary | ICD-10-CM | POA: Insufficient documentation

## 2018-09-08 NOTE — Telephone Encounter (Signed)
Wants to know if there are different levels of osteopenia or numbers to go by to tell how bad it is. Also wants to know if her taking prolisec long term can have anything to do with osteopenia since it takes the acid out of your body and does not let you absorb calcium like you are supposed to per patient. She requests that a my chart message be sent to her with these answers if possible.

## 2018-09-09 ENCOUNTER — Encounter: Payer: Self-pay | Admitting: Gastroenterology

## 2018-09-09 ENCOUNTER — Ambulatory Visit: Payer: BLUE CROSS/BLUE SHIELD | Admitting: Pulmonary Disease

## 2018-09-09 ENCOUNTER — Encounter: Payer: Self-pay | Admitting: Internal Medicine

## 2018-09-09 ENCOUNTER — Ambulatory Visit: Payer: BLUE CROSS/BLUE SHIELD | Admitting: Gastroenterology

## 2018-09-09 VITALS — BP 166/100 | HR 72 | Ht 64.25 in | Wt 142.0 lb

## 2018-09-09 VITALS — BP 158/90 | HR 76

## 2018-09-09 DIAGNOSIS — R112 Nausea with vomiting, unspecified: Secondary | ICD-10-CM | POA: Diagnosis not present

## 2018-09-09 DIAGNOSIS — M791 Myalgia, unspecified site: Secondary | ICD-10-CM | POA: Diagnosis not present

## 2018-09-09 DIAGNOSIS — G43719 Chronic migraine without aura, intractable, without status migrainosus: Secondary | ICD-10-CM | POA: Diagnosis not present

## 2018-09-09 DIAGNOSIS — G4733 Obstructive sleep apnea (adult) (pediatric): Secondary | ICD-10-CM

## 2018-09-09 DIAGNOSIS — Z9989 Dependence on other enabling machines and devices: Secondary | ICD-10-CM | POA: Diagnosis not present

## 2018-09-09 DIAGNOSIS — G518 Other disorders of facial nerve: Secondary | ICD-10-CM | POA: Diagnosis not present

## 2018-09-09 DIAGNOSIS — M542 Cervicalgia: Secondary | ICD-10-CM | POA: Diagnosis not present

## 2018-09-09 NOTE — Patient Instructions (Addendum)
We will get records sent from your previous gastroenterologist for review.  This will include any endoscopic (colonoscopy or upper endoscopy) procedures and any associated pathology reports.    Call when you are ready to schedule the colonoscopy and EGD.  Thank you for entrusting me with your care and choosing [facility].  Dr Ardis Hughs

## 2018-09-09 NOTE — Progress Notes (Signed)
Tracie Hart    [id number]    01/25/1956  Primary Care Physician:Burns, Claudina Lick, MD  Referring Physician: Binnie Rail, MD [place], [place] [id number]  Chief complaint:   History of obstructive sleep apnea with history of difficulty tolerating CPAP therapy secondary to interface She is currently using a full facemask and tolerating it better at present  HPI:   Diagnosed with obstructive sleep apnea few years back, she did try CPAP for a while, she was not able to tolerate CPAP secondary to the interface, and oral device was fashioned which she had been using She is currently on CPAP with a facemask She does have occasional dryness and she is talking with the DME company regarding obtaining a strap Symptoms are much better at present Morning headaches are much better Usually goes to bed about 9-10 PM, may take about 1 to 2 hours to fall asleep  She has chronic headaches, migraines Occupation: No pertinent occupational history Exposures: No significant exposures Smoking history: Never smoker Travel history: No significant recent   Outpatient Encounter Medications as of 09/09/2018  Medication Sig  . calcium-vitamin D (OSCAL WITH D) 500-200 MG-UNIT tablet Take 1 tablet by mouth.  . Cholecalciferol (VITAMIN D) 2000 units tablet Take 2,000 Units by mouth daily.  Marland Kitchen escitalopram (LEXAPRO) 20 MG tablet Take 1 tablet (20 mg total) by mouth daily.  . lansoprazole (PREVACID) 30 MG capsule Take 1 capsule (30 mg total) by mouth daily at 12 noon.  Marland Kitchen levothyroxine (SYNTHROID, LEVOTHROID) 50 MCG tablet Take 1 tablet (50 mcg total) by mouth daily before breakfast.  . Magnesium 400 MG TABS Take 400 mg by mouth daily.  . SUMAtriptan (IMITREX) 100 MG tablet Take 1 tablet (100 mg total) by mouth every 2 (two) hours as needed for migraine. May repeat in 2 hours if headache persists or recurs.  . vitamin B-12 (CYANOCOBALAMIN) 500 MCG tablet Take 500 mcg by mouth daily.   No  facility-administered encounter medications on file as of 09/09/2018.     Allergies as of 09/09/2018 - Review Complete 09/09/2018  Allergen Reaction Noted  . Codeine Itching 03/17/2018    Past Medical History:  Diagnosis Date  . Anemia   . Anxiety   . Depression   . GERD (gastroesophageal reflux disease)   . Migraine   . Migraine   . Osteopenia   . Sleep apnea with use of continuous positive airway pressure (CPAP)   . Thyroid disease     Past Surgical History:  Procedure Laterality Date  . GALLBLADDER SURGERY    . KNEE ARTHROSCOPY Left   . NEUROFIBROMA EXCISION  1986  . PALATE / UVULA BIOPSY / EXCISION      Family History  Problem Relation Age of Onset  . Arthritis Mother   . Diabetes Mother   . Heart disease Mother   . Hypertension Mother   . Stroke Mother   . Arthritis Father   . Depression Father   . Diabetes Father   . Hyperlipidemia Father   . Hypertension Father   . Depression Brother   . Hypertension Brother   . Heart disease Maternal Grandmother   . COPD Paternal Grandmother   . COPD Paternal Grandfather   . Colon cancer Paternal Grandfather     Social History   Socioeconomic History  . Marital status: Legally Separated    Spouse name: Not on file  . Number of children: 1  . Years  of education: Not on file  . Highest education level: Not on file  Occupational History  . Occupation: retired  Scientific laboratory technician  . Financial resource strain: Not on file  . Food insecurity:    Worry: Not on file    Inability: Not on file  . Transportation needs:    Medical: Not on file    Non-medical: Not on file  Tobacco Use  . Smoking status: Never Smoker  . Smokeless tobacco: Never Used  Substance and Sexual Activity  . Alcohol use: Yes    Comment: rarely  . Drug use: No  . Sexual activity: Never  Lifestyle  . Physical activity:    Days per week: Not on file    Minutes per session: Not on file  . Stress: Not on file  Relationships  . Social  connections:    Talks on phone: Not on file    Gets together: Not on file    Attends religious service: Not on file    Active member of club or organization: Not on file    Attends meetings of clubs or organizations: Not on file    Relationship status: Not on file  . Intimate partner violence:    Fear of current or ex partner: Not on file    Emotionally abused: Not on file    Physically abused: Not on file    Forced sexual activity: Not on file  Other Topics Concern  . Not on file  Social History Narrative   No regular exercise    Review of systems: Constitutional: Negative for fever and chills.  Significant fatigue HENT: Negative.  Soreness on her gums    Eyes: Negative for blurred vision.  Respiratory: as per HPI  Cardiovascular: Negative for chest pain and palpitations.  Gastrointestinal: Negative for vomiting, diarrhea, blood per rectum. Headaches-a lot better All other systems reviewed and are negative.  Physical Exam:  Vitals:   09/09/18 1600  BP: (!) 158/90  Pulse: 76  SpO2: 95%   Gen:      No acute distress HEENT: Crowded oropharynx  Lungs:    Clear breath sounds bilaterally CV:         Regular rate and rhythm; no murmurs Abd:      + bowel sounds; soft, non-tender; no palpable masses, no distension Ext:    No edema; adequate peripheral perfusion  Data Reviewed: Marland Kitchen  Obstructive sleep apnea  .  Daytime sleepiness   .  Compliance data reveals excellent compliance with an AHI of 1.3  Assessment:  .  Obstructive sleep apnea is well treated at the present time  Symptoms improving with CPAP use, has some issues with dryness which she is trying to work out  We will get compliance data intermittently to ascertain continuing compliance  She is working on the stressors she is dealing with at present  She is improving We will see her back in the office in 6 months  Sherrilyn Rist MD [facility] 09/09/2018, 5:27 PM  CC: Binnie Rail, MD

## 2018-09-09 NOTE — Progress Notes (Signed)
HPI: This is a very pleasant 62 year old woman who was referred to me by Binnie Rail, MD  to evaluate intermittent diarrhea, vomiting.    Chief complaint is intermittent diarrhea vomiting  GB resection 2012 this was uneventful.  Since her gallbladder surgery she has intermittent episodes of acute diarrhea, vomiting.  These occur about every 3 to 4 months but more recently they have occurred a bit more frequently and have been more intense.  No relation to particular foods generally however the past 2 episodes she ate pizza just prior.  After the episodes she will have abdominal pains, usually it is brief but recently the pain lasted about a week  Overall decreased weight a bit; semi intentionally.  Recently found she has osteopenia  1986 tumor in her spine removed, benign.  Colonoscopy about 10 years ago, [place].  Routine screening, normal.  On prevacid for years;  For GERD. Epigastric burning, hurting.  No dysphagia.   Old Data Reviewed:  Blood work April 2019, normal CBC except for platelets 130.  Complete med about profile was normal.  Amylase and lipase were normal.     Review of systems: Pertinent positive and negative review of systems were noted in the above HPI section. All other review negative.   Past Medical History:  Diagnosis Date  . Anemia   . Anxiety   . Depression   . GERD (gastroesophageal reflux disease)   . Migraine   . Migraine   . Osteopenia   . Sleep apnea with use of continuous positive airway pressure (CPAP)   . Thyroid disease     Past Surgical History:  Procedure Laterality Date  . GALLBLADDER SURGERY    . KNEE ARTHROSCOPY Left   . NEUROFIBROMA EXCISION  1986  . PALATE / UVULA BIOPSY / EXCISION      Current Outpatient Medications  Medication Sig Dispense Refill  . calcium-vitamin D (OSCAL WITH D) 500-200 MG-UNIT tablet Take 1 tablet by mouth.    . Cholecalciferol (VITAMIN D) 2000 units tablet Take 2,000 Units by mouth daily.     Marland Kitchen escitalopram (LEXAPRO) 20 MG tablet Take 1 tablet (20 mg total) by mouth daily. 90 tablet 3  . lansoprazole (PREVACID) 30 MG capsule Take 1 capsule (30 mg total) by mouth daily at 12 noon. 90 capsule 3  . levothyroxine (SYNTHROID, LEVOTHROID) 50 MCG tablet Take 1 tablet (50 mcg total) by mouth daily before breakfast. 90 tablet 3  . Magnesium 400 MG TABS Take 400 mg by mouth daily.    . SUMAtriptan (IMITREX) 100 MG tablet Take 1 tablet (100 mg total) by mouth every 2 (two) hours as needed for migraine. May repeat in 2 hours if headache persists or recurs. 10 tablet 0  . vitamin B-12 (CYANOCOBALAMIN) 500 MCG tablet Take 500 mcg by mouth daily.     No current facility-administered medications for this visit.     Allergies as of 09/09/2018 - Review Complete 09/09/2018  Allergen Reaction Noted  . Codeine Itching 03/17/2018    Family History  Problem Relation Age of Onset  . Arthritis Mother   . Diabetes Mother   . Heart disease Mother   . Hypertension Mother   . Stroke Mother   . Arthritis Father   . Depression Father   . Diabetes Father   . Hyperlipidemia Father   . Hypertension Father   . Depression Brother   . Hypertension Brother   . Heart disease Maternal Grandmother   . COPD Paternal  Grandmother   . COPD Paternal Grandfather   . Colon cancer Paternal Grandfather     Social History   Socioeconomic History  . Marital status: Legally Separated    Spouse name: Not on file  . Number of children: 1  . Years of education: Not on file  . Highest education level: Not on file  Occupational History  . Occupation: retired  Scientific laboratory technician  . Financial resource strain: Not on file  . Food insecurity:    Worry: Not on file    Inability: Not on file  . Transportation needs:    Medical: Not on file    Non-medical: Not on file  Tobacco Use  . Smoking status: Never Smoker  . Smokeless tobacco: Never Used  Substance and Sexual Activity  . Alcohol use: Yes    Comment: rarely   . Drug use: No  . Sexual activity: Never  Lifestyle  . Physical activity:    Days per week: Not on file    Minutes per session: Not on file  . Stress: Not on file  Relationships  . Social connections:    Talks on phone: Not on file    Gets together: Not on file    Attends religious service: Not on file    Active member of club or organization: Not on file    Attends meetings of clubs or organizations: Not on file    Relationship status: Not on file  . Intimate partner violence:    Fear of current or ex partner: Not on file    Emotionally abused: Not on file    Physically abused: Not on file    Forced sexual activity: Not on file  Other Topics Concern  . Not on file  Social History Narrative   No regular exercise     Physical Exam: BP (!) 166/100 (BP Location: Left Arm, Patient Position: Sitting, Cuff Size: Normal)   Pulse 72   Ht 5' 4.25" (1.632 m) Comment: height measured without shoes  Wt 142 lb (64.4 kg)   BMI 24.18 kg/m  Constitutional: generally well-appearing Psychiatric: alert and oriented x3 Eyes: extraocular movements intact Mouth: oral pharynx moist, no lesions Neck: supple no lymphadenopathy Cardiovascular: heart regular rate and rhythm Lungs: clear to auscultation bilaterally Abdomen: soft, nontender, nondistended, no obvious ascites, no peritoneal signs, normal bowel sounds Extremities: no lower extremity edema bilaterally Skin: no lesions on visible extremities   Assessment and plan: 62 y.o. female with intermittent episodes of vomiting, diarrhea, abdominal pain  First she is very clear that these episodes started after her gallbladder was removed about 7 years ago.  Possibly this is a variant of bile acid related symptoms and I offered a trial of cholestyramine powder but she declined for now.  I also recommended an upper endoscopy to exclude other causes of her vomiting such as significant gastritis, peptic ulcer disease.  She is due for colon cancer  screening since her last test was about 10 years ago.  We will have that colonoscopy sent here for review but she is pretty clear that it was normal and it was at least 10 years ago.  I recommended at the same time as her upper endoscopy that we proceed with screening colonoscopy.  She has been having a lot of back pains recently and she wants to focus on those before going ahead with the work-up I recommended.  She was actually bit concerned about her elevated blood pressure that we initially took here since she  just had a spinal injection.  We are going to repeat her blood pressure before she leaves and if it is still elevated I recommend she call her back doctor.  Please see the "Patient Instructions" section for addition details about the plan.   Owens Loffler, MD [facility] 09/09/2018, 9:17 AM  Cc: Binnie Rail, MD

## 2018-09-09 NOTE — Patient Instructions (Signed)
Continue current Cpap therapy. 6 month follow with Dr. Ander Slade or NP.

## 2018-09-10 ENCOUNTER — Ambulatory Visit
Admission: RE | Admit: 2018-09-10 | Discharge: 2018-09-10 | Disposition: A | Discharge status: Home / Self Care | Payer: BLUE CROSS/BLUE SHIELD | Source: Ambulatory Visit | Attending: Internal Medicine | Admitting: Internal Medicine

## 2018-09-10 DIAGNOSIS — Z1231 Encounter for screening mammogram for malignant neoplasm of breast: Secondary | ICD-10-CM | POA: Diagnosis not present

## 2018-09-10 DIAGNOSIS — M545 Low back pain: Secondary | ICD-10-CM | POA: Diagnosis not present

## 2018-09-14 ENCOUNTER — Other Ambulatory Visit: Payer: Self-pay | Admitting: Internal Medicine

## 2018-09-14 DIAGNOSIS — M5126 Other intervertebral disc displacement, lumbar region: Secondary | ICD-10-CM | POA: Diagnosis not present

## 2018-09-14 DIAGNOSIS — M8580 Other specified disorders of bone density and structure, unspecified site: Secondary | ICD-10-CM | POA: Diagnosis not present

## 2018-09-14 DIAGNOSIS — M545 Low back pain: Secondary | ICD-10-CM | POA: Diagnosis not present

## 2018-09-14 DIAGNOSIS — R928 Other abnormal and inconclusive findings on diagnostic imaging of breast: Secondary | ICD-10-CM

## 2018-09-15 ENCOUNTER — Telehealth: Payer: Self-pay | Admitting: Gastroenterology

## 2018-09-15 NOTE — Telephone Encounter (Signed)
We requested records from her previous gastroenterologist.  I reviewed a large packet of information.  None of it contains any information about previous colonoscopies or upper endoscopies.  There was a barium esophagram report from July 2018 done for dysphasia.  Impression reads "epiglottic inversion not definitely seen, however there is no laryngeal penetration or tracheal aspiration.  Very small intermittent hiatal hernia.  No gastroesophageal reflux.  Patient swallowed a 13 mm barium pill which passed into the stomach without delay.  Esophagus, stomach and proximal small bowel otherwise unremarkable.

## 2018-09-17 ENCOUNTER — Ambulatory Visit
Admission: RE | Admit: 2018-09-17 | Discharge: 2018-09-17 | Disposition: A | Discharge status: Home / Self Care | Payer: BLUE CROSS/BLUE SHIELD | Source: Ambulatory Visit | Attending: Internal Medicine | Admitting: Internal Medicine

## 2018-09-17 ENCOUNTER — Other Ambulatory Visit: Payer: Self-pay | Admitting: Internal Medicine

## 2018-09-17 ENCOUNTER — Encounter: Payer: Self-pay | Admitting: Internal Medicine

## 2018-09-17 ENCOUNTER — Ambulatory Visit: Payer: BLUE CROSS/BLUE SHIELD

## 2018-09-17 DIAGNOSIS — R928 Other abnormal and inconclusive findings on diagnostic imaging of breast: Secondary | ICD-10-CM

## 2018-09-17 DIAGNOSIS — R921 Mammographic calcification found on diagnostic imaging of breast: Secondary | ICD-10-CM | POA: Diagnosis not present

## 2018-09-17 DIAGNOSIS — N6310 Unspecified lump in the right breast, unspecified quadrant: Secondary | ICD-10-CM | POA: Diagnosis not present

## 2018-09-21 ENCOUNTER — Ambulatory Visit
Admission: RE | Admit: 2018-09-21 | Discharge: 2018-09-21 | Disposition: A | Discharge status: Home / Self Care | Payer: BLUE CROSS/BLUE SHIELD | Source: Ambulatory Visit | Attending: Internal Medicine | Admitting: Internal Medicine

## 2018-09-21 DIAGNOSIS — R921 Mammographic calcification found on diagnostic imaging of breast: Secondary | ICD-10-CM

## 2018-09-21 DIAGNOSIS — D0511 Intraductal carcinoma in situ of right breast: Secondary | ICD-10-CM | POA: Diagnosis not present

## 2018-09-24 ENCOUNTER — Ambulatory Visit: Payer: Self-pay | Admitting: General Surgery

## 2018-09-24 DIAGNOSIS — D0511 Intraductal carcinoma in situ of right breast: Secondary | ICD-10-CM

## 2018-09-26 DIAGNOSIS — G4733 Obstructive sleep apnea (adult) (pediatric): Secondary | ICD-10-CM | POA: Diagnosis not present

## 2018-09-30 ENCOUNTER — Telehealth: Payer: Self-pay | Admitting: Hematology and Oncology

## 2018-09-30 ENCOUNTER — Encounter: Payer: Self-pay | Admitting: Hematology and Oncology

## 2018-09-30 NOTE — Telephone Encounter (Signed)
Received a new referral from Dr. Excell Seltzer for breast cancer. Pt has been cld and scheduled to see Dr. Lindi Adie on 11/25 at 1pm. Letter mailed.

## 2018-10-05 ENCOUNTER — Other Ambulatory Visit: Payer: Self-pay | Admitting: General Surgery

## 2018-10-05 DIAGNOSIS — M5136 Other intervertebral disc degeneration, lumbar region: Secondary | ICD-10-CM | POA: Diagnosis not present

## 2018-10-05 DIAGNOSIS — M5416 Radiculopathy, lumbar region: Secondary | ICD-10-CM | POA: Insufficient documentation

## 2018-10-05 DIAGNOSIS — M545 Low back pain: Secondary | ICD-10-CM | POA: Diagnosis not present

## 2018-10-05 DIAGNOSIS — D0511 Intraductal carcinoma in situ of right breast: Secondary | ICD-10-CM

## 2018-10-06 DIAGNOSIS — M5136 Other intervertebral disc degeneration, lumbar region: Secondary | ICD-10-CM | POA: Diagnosis not present

## 2018-10-06 NOTE — Progress Notes (Signed)
Location of Breast Cancer: Upper outer right breast  Histology per Pathology Report: 09/21/18: Diagnosis Breast, right, needle core biopsy, outer quadrant coil clip - DUCTAL CARCINOMA IN SITU - SEE COMMENT   PROGNOSTIC INDICATORS Results: IMMUNOHISTOCHEMICAL AND MORPHOMETRIC ANALYSIS PERFORMED MANUALLY Estrogen Receptor: 10%, POSITIVE, WEAK STAINING INTENSITY Progesterone Receptor: 0%, NEGATIVE  Receptor Status: ER(10%), PR (0%)  Did patient present with symptoms (if so, please note symptoms) or was this found on screening mammography?: screening mammogram  Past/Anticipated interventions by surgeon, if any: Planned 10/22/18 Excell Seltzer, MD : BREAST LUMPECTOMY WITH RADIOACTIVE SEED LOCALIZATION   Past/Anticipated interventions by medical oncology, if any: Chemotherapy None at this time.  Lymphedema issues, if any:  Pt has not had surgery at this time.    Pain issues, if any:  Pt c/o pain in back from herniated disc that radiates down right leg. Pt recently received ESI for disc.   SAFETY ISSUES:  Prior radiation? No  Pacemaker/ICD? No  Possible current pregnancy? No, pt is post-menopausal  Is the patient on methotrexate? No  Current Complaints / other details:  Pt presents today for initial consult with Dr. Sondra Come for Radiation Oncology. Pt is tearful when discussing separation from husband (has been separated one year). Pt with questions related to receptor results. Pt is unaccompanied.     Loma Sousa, RN 10/08/2018,12:40 PM

## 2018-10-08 ENCOUNTER — Ambulatory Visit
Admission: RE | Admit: 2018-10-08 | Discharge: 2018-10-08 | Disposition: A | Discharge status: Home / Self Care | Payer: BLUE CROSS/BLUE SHIELD | Source: Ambulatory Visit | Attending: Radiation Oncology | Admitting: Radiation Oncology

## 2018-10-08 ENCOUNTER — Encounter: Payer: Self-pay | Admitting: Radiation Oncology

## 2018-10-08 ENCOUNTER — Other Ambulatory Visit: Payer: Self-pay

## 2018-10-08 VITALS — BP 172/97 | HR 73 | Temp 98.8°F | Resp 18 | Ht 64.0 in | Wt 147.4 lb

## 2018-10-08 DIAGNOSIS — Z79899 Other long term (current) drug therapy: Secondary | ICD-10-CM | POA: Insufficient documentation

## 2018-10-08 DIAGNOSIS — C50411 Malignant neoplasm of upper-outer quadrant of right female breast: Secondary | ICD-10-CM

## 2018-10-08 DIAGNOSIS — D0511 Intraductal carcinoma in situ of right breast: Secondary | ICD-10-CM | POA: Insufficient documentation

## 2018-10-08 DIAGNOSIS — Z170 Estrogen receptor positive status [ER+]: Secondary | ICD-10-CM | POA: Diagnosis not present

## 2018-10-08 NOTE — Progress Notes (Signed)
Radiation Oncology         [phone] ________________________________  Initial Outpatient Consultation  Name: Tracie Hart MRN: [id number]  Date: 10/08/2018  DOB: 09-Aug-1956  NF:AOZHY, Claudina Lick, MD  Excell Seltzer, MD   REFERRING PHYSICIAN: Excell Seltzer, MD  DIAGNOSIS: The encounter diagnosis was Malignant neoplasm of upper-outer quadrant of right female breast, unspecified estrogen receptor status ([place]).  high grade breast DCIS.   HISTORY OF PRESENT ILLNESS::Tracie Hart is a 62 y.o. female who is here today at the request of Dr. Excell Seltzer to discuss the role of radiotherapy in the management of her newly diagnosed high grade breast DCIS.  She initially presented for screening mammogram on 09/10/18 which showed possible calcifications in the right breast and distortion in the left breast. At that time, she reported a stable, palpable mass in her upper right breast. Diagnostic mammogram and ultrasound on 09/17/18 showed a 1.3 cm group of suspicious calcifications within the upper-outer right breast. A benign lipoma was noted within the upper right breast, corresponding to the reported palpable abnormality. There was no persistent abnormality in the left breast. No abnormal right axillary lymph nodes were identified.  Biopsy of the upper-outer right breast on 09/21/18 revealed ductal carcinoma in situ, high grade, measuring 0.6 cm in greatest linear extent; ER positive, PR negative.  The patient reviewed these results with Dr. Excell Seltzer and has been scheduled for right breast lumpectomy on 10/22/18. She will meet with Dr. Lindi Adie in medical oncology next week.  On review of systems, the patient reports tenderness in her right axilla. She reports back pain from a herniated disc that radiates down her right leg. She recently received an epidural steroid injection two days ago for this. She reports chronic migraines and has received Botox injections over the past year with  significant relief. 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 She reports some numbness in her right medial thigh from this.  PREVIOUS RADIATION THERAPY: No  PAST MEDICAL HISTORY:  has a past medical history of Anemia, Anxiety, Depression, GERD (gastroesophageal reflux disease), Migraine, Migraine, Osteopenia, Sleep apnea with use of continuous positive airway pressure (CPAP), and Thyroid disease.    PAST SURGICAL HISTORY: Past Surgical History:  Procedure Laterality Date  . GALLBLADDER SURGERY    . KNEE ARTHROSCOPY Left   . NEUROFIBROMA EXCISION  1986  . PALATE / UVULA BIOPSY / EXCISION      FAMILY HISTORY: family history includes Arthritis in her father and mother; COPD in her paternal grandfather and paternal grandmother; Colon cancer in her paternal grandfather; Depression in her brother and father; Diabetes in her father and mother; Heart disease in her maternal grandmother and mother; Hyperlipidemia in her father; Hypertension in her brother, father, and mother; Stroke in her mother.  SOCIAL HISTORY:  reports that she has never smoked. She has never used smokeless tobacco. She reports that she drinks alcohol. She reports that she does not use drugs.  ALLERGIES: Codeine  MEDICATIONS:  Current Outpatient Medications  Medication Sig Dispense Refill  . calcium-vitamin D (OSCAL WITH D) 500-200 MG-UNIT tablet Take 1 tablet by mouth.    . Cholecalciferol (VITAMIN D) 2000 units tablet Take 2,000 Units by mouth daily.    Marland Kitchen escitalopram (LEXAPRO) 20 MG tablet Take 1 tablet (20 mg total) by mouth daily. 90 tablet 3  . lansoprazole (PREVACID) 30 MG capsule Take 1 capsule (30 mg total) by mouth daily at 12 noon. 90 capsule 3  .  levothyroxine (SYNTHROID, LEVOTHROID) 50 MCG tablet Take 1 tablet (50 mcg total) by mouth daily before breakfast. 90 tablet 3  . Magnesium 400 MG TABS Take 400 mg by mouth daily.    . SUMAtriptan (IMITREX) 100 MG tablet Take  1 tablet (100 mg total) by mouth every 2 (two) hours as needed for migraine. May repeat in 2 hours if headache persists or recurs. 10 tablet 0  . vitamin B-12 (CYANOCOBALAMIN) 500 MCG tablet Take 500 mcg by mouth daily.     No current facility-administered medications for this encounter.     REVIEW OF SYSTEMS:  REVIEW OF SYSTEMS: A 10+ POINT REVIEW OF SYSTEMS WAS OBTAINED including neurology, dermatology, psychiatry, cardiac, respiratory, lymph, extremities, GI, GU, musculoskeletal, constitutional, reproductive, HEENT. All pertinent positives are noted in the HPI. All others are negative.   PHYSICAL EXAM:  height is 5\' 4"  (1.626 m) and weight is 147 lb 6.4 oz (66.9 kg). Her oral temperature is 98.8 F (37.1 C). Her blood pressure is 172/97 (abnormal) and her pulse is 73. Her respiration is 18 and oxygen saturation is 99%.   General: Alert and oriented, in no acute distress. HEENT: Head is normocephalic. Extraocular movements are intact. Oropharynx is clear. Neck: Neck is supple, no palpable cervical or supraclavicular lymphadenopathy. Heart: Regular in rate and rhythm with no murmurs, rubs, or gallops. Chest: Clear to auscultation bilaterally, with no rhonchi, wheezes, or rales. Abdomen: Soft, nontender, nondistended, with no rigidity or guarding. Extremities: No cyanosis or edema. Lymphatics: see Neck Exam Skin: No concerning lesions. Musculoskeletal: Symmetric strength and muscle tone throughout. Neurologic: Cranial nerves II through XII are grossly intact. No obvious focalities. Speech is fluent. Coordination is intact. Psychiatric: Judgment and insight are intact. Affect is appropriate.  Right Breast: Patient has a small biopsy site in the upper central aspect of the breast. She has a palpable, somewhat soft mass in the upper aspect of the breast consistent with her report of lipoma. This is mildly tender with palpation. Left Breast: No palpable mass, nipple discharge or  bleeding.   ECOG = 0  0 - Asymptomatic (Fully active, able to carry on all predisease activities without restriction)  1 - Symptomatic but completely ambulatory (Restricted in physically strenuous activity but ambulatory and able to carry out work of a light or sedentary nature. For example, light housework, office work)  2 - Symptomatic, <50% in bed during the day (Ambulatory and capable of all self care but unable to carry out any work activities. Up and about more than 50% of waking hours)  3 - Symptomatic, >50% in bed, but not bedbound (Capable of only limited self-care, confined to bed or chair 50% or more of waking hours)  4 - Bedbound (Completely disabled. Cannot carry on any self-care. Totally confined to bed or chair)  5 - Death   Eustace Pen MM, Creech RH, Tormey DC, et al. [phone]). "Toxicity and response criteria of the [facility]". [place] Oncol. 5 (6): 649-55  LABORATORY DATA:  Lab Results  Component Value Date   WBC 5.7 03/17/2018   HGB 14.5 03/17/2018   HCT 43.0 03/17/2018   MCV 82.7 03/17/2018   PLT 130.0 (L) 03/17/2018   NEUTROABS 3.9 03/17/2018   Lab Results  Component Value Date   NA 140 03/17/2018   K 3.9 03/17/2018   CL 102 03/17/2018   CO2 31 03/17/2018   GLUCOSE 91 03/17/2018   CREATININE 0.75 03/17/2018   CALCIUM 9.2 03/17/2018  RADIOGRAPHY: US Breast Ltd Uni Right Inc Axilla  Result Date: 09/17/2018 CLINICAL DATA:  62 year old female for further evaluation of RIGHT breast calcifications and possible LEFT breast distortion on screening mammogram. Patient also states she has a stable palpable mass in her UPPER RIGHT breast. EXAM: DIGITAL DIAGNOSTIC BILATERAL MAMMOGRAM WITH CAD AND TOMO ULTRASOUND RIGHT BREAST COMPARISON:  Previous exam(s). ACR Breast Density Category c: The breast tissue is heterogeneously dense, which may obscure small masses. FINDINGS: Full field and magnification views of the RIGHT breast and 2D/3D spot  compression views of the LEFT breast are performed. A 1.3 cm group of heterogeneous calcifications, many in a linear orientation, is identified within the UPPER OUTER RIGHT breast. No persistent abnormalities identified in the area of the LEFT breast screening study finding. Mammographic images were processed with CAD. On physical exam, a soft palpable mass identified at the 12:30 position of the RIGHT breast 10 cm from the nipple corresponding to the patient's palpable abnormality. Targeted ultrasound is performed, showing no definite abnormality in the area of the UPPER-OUTER RIGHT breast calcifications identified mammographically. A 2.4 x 0.8 x 3 cm lipoma at the 12:30 position of the RIGHT breast 10 cm from the nipple is identified, corresponding to the patient's palpable abnormality. No abnormal RIGHT axillary lymph nodes are identified. IMPRESSION: 1. 1.3 cm group of suspicious calcifications within the UPPER-OUTER RIGHT breast. Tissue sampling is recommended. 2. Benign lipoma within the UPPER RIGHT breast corresponding to the patient's palpable abnormality 3. No persistent abnormality in the area of the LEFT breast screening study finding. RECOMMENDATION: Stereotactic/3D guided biopsy of UPPER-OUTER RIGHT breast calcifications, which will be scheduled. I have discussed the findings and recommendations with the patient. Results were also provided in writing at the conclusion of the visit. If applicable, a reminder letter will be sent to the patient regarding the next appointment. BI-RADS CATEGORY  4: Suspicious. Electronically Signed   By: Margarette Canada M.D.   On: 09/17/2018 11:06   Mm Diag Breast Tomo Bilateral  Result Date: 09/17/2018 CLINICAL DATA:  62 year old female for further evaluation of RIGHT breast calcifications and possible LEFT breast distortion on screening mammogram. Patient also states she has a stable palpable mass in her UPPER RIGHT breast. EXAM: DIGITAL DIAGNOSTIC BILATERAL MAMMOGRAM  WITH CAD AND TOMO ULTRASOUND RIGHT BREAST COMPARISON:  Previous exam(s). ACR Breast Density Category c: The breast tissue is heterogeneously dense, which may obscure small masses. FINDINGS: Full field and magnification views of the RIGHT breast and 2D/3D spot compression views of the LEFT breast are performed. A 1.3 cm group of heterogeneous calcifications, many in a linear orientation, is identified within the UPPER OUTER RIGHT breast. No persistent abnormalities identified in the area of the LEFT breast screening study finding. Mammographic images were processed with CAD. On physical exam, a soft palpable mass identified at the 12:30 position of the RIGHT breast 10 cm from the nipple corresponding to the patient's palpable abnormality. Targeted ultrasound is performed, showing no definite abnormality in the area of the UPPER-OUTER RIGHT breast calcifications identified mammographically. A 2.4 x 0.8 x 3 cm lipoma at the 12:30 position of the RIGHT breast 10 cm from the nipple is identified, corresponding to the patient's palpable abnormality. No abnormal RIGHT axillary lymph nodes are identified. IMPRESSION: 1. 1.3 cm group of suspicious calcifications within the UPPER-OUTER RIGHT breast. Tissue sampling is recommended. 2. Benign lipoma within the UPPER RIGHT breast corresponding to the patient's palpable abnormality 3. No persistent abnormality in the area of the LEFT  breast screening study finding. RECOMMENDATION: Stereotactic/3D guided biopsy of UPPER-OUTER RIGHT breast calcifications, which will be scheduled. I have discussed the findings and recommendations with the patient. Results were also provided in writing at the conclusion of the visit. If applicable, a reminder letter will be sent to the patient regarding the next appointment. BI-RADS CATEGORY  4: Suspicious. Electronically Signed   By: Margarette Canada M.D.   On: 09/17/2018 11:06   Mm 3d Screen Breast Bilateral  Result Date: 09/11/2018 CLINICAL DATA:   Screening. EXAM: DIGITAL SCREENING BILATERAL MAMMOGRAM WITH TOMO AND CAD COMPARISON:  Previous exam(s). ACR Breast Density Category c: The breast tissue is heterogeneously dense, which may obscure small masses. FINDINGS: In the right breast a group of calcifications requires further evaluation. In the left breast a possible distortion requires further evaluation. Images were processed with CAD. IMPRESSION: Further evaluation is suggested for possible calcifications in the right breast. Further evaluation is suggested for possible distortion in the left breast. RECOMMENDATION: Diagnostic mammogram and possibly ultrasound of both breasts. (Code:FI-B-12M) The patient will be contacted regarding the findings, and additional imaging will be scheduled. BI-RADS CATEGORY  0: Incomplete. Need additional imaging evaluation and/or prior mammograms for comparison. Electronically Signed   By: Ammie Ferrier M.D.   On: 09/11/2018 08:09   Mm Clip Placement Right  Result Date: 09/21/2018 CLINICAL DATA:  Status post stereotactic guided core biopsy of RIGHT breast calcifications and placement of a coil shaped clip. EXAM: DIAGNOSTIC RIGHT MAMMOGRAM POST STEREOTACTIC BIOPSY COMPARISON:  Previous exam(s). FINDINGS: Mammographic images were obtained following stereotactic guided biopsy of calcifications in the UPPER-OUTER QUADRANT of the RIGHT breast. A coil shaped clip is identified adjacent to residual faint calcifications in the UPPER-OUTER QUADRANT. IMPRESSION: Tissue marker clip in the expected location following biopsy. Final Assessment: Post Procedure Mammograms for Marker Placement Electronically Signed   By: Nolon Nations M.D.   On: 09/21/2018 10:19   Mm Rt Breast Bx W Loc Dev 1st Lesion Image Bx Spec Stereo Guide  Addendum Date: 09/22/2018   ADDENDUM REPORT: 09/22/2018 13:30 ADDENDUM: Pathology revealed HIGH GRADE DUCTAL CARCINOMA IN SITU of the Right breast, outer quadrant coil clip. This was found to be  concordant by Dr. Nolon Nations. Pathology results were discussed with the patient by telephone. The patient reported doing well after the biopsy with tenderness at the site. Post biopsy instructions and care were reviewed and questions were answered. The patient was encouraged to call [facility] for any additional concerns. Surgical consultation has been arranged with Dr. Adonis Housekeeper at [facility] on September 24, 2018. Pathology results reported by Terie Purser, RN on 09/22/2018. Electronically Signed   By: Nolon Nations M.D.   On: 09/22/2018 13:30   Result Date: 09/22/2018 CLINICAL DATA:  The patient presents for stereotactic guided core biopsy of calcifications in the RIGHT breast. EXAM: RIGHT BREAST STEREOTACTIC CORE NEEDLE BIOPSY COMPARISON:  Previous exams. FINDINGS: The patient and I discussed the procedure of stereotactic-guided biopsy including benefits and alternatives. We discussed the high likelihood of a successful procedure. We discussed the risks of the procedure including infection, bleeding, tissue injury, clip migration, and inadequate sampling. Informed written consent was given. The usual time out protocol was performed immediately prior to the procedure. Using sterile technique and 1% Lidocaine as local anesthetic, under stereotactic guidance, a 9 gauge vacuum assisted device was used to perform core needle biopsy of calcifications in the UPPER-OUTER QUADRANT of the RIGHT breast using a superior to  inferior approach. Specimen radiograph was performed showing calcifications in numerous stable. Specimens with calcifications are identified for pathology. Lesion quadrant: UPPER-OUTER QUADRANT RIGHT breast At the conclusion of the procedure, a coil shaped tissue marker clip was deployed into the biopsy cavity. Follow-up 2-view mammogram was performed and dictated separately. IMPRESSION: Stereotactic-guided biopsy of RIGHT breast calcifications. No  apparent complications. Electronically Signed: By: Nolon Nations M.D. On: 09/21/2018 10:00      IMPRESSION: High grade DCIS of the right breast  Patient would be a good candidate for breast conservation with radiation therapy as part of her overall management. Given the high grade nature of the lesion and her age, I do not feel she would be a candidate for lumpectomy alone.   I talked to the patient about the findings and work-up thus far.  We discussed the natural history of breast DCIS and general treatment, highlighting the role of adjuvant radiotherapy in the management.  We discussed the available radiation techniques, and focused on the details of logistics and delivery.  We reviewed the anticipated acute and late sequelae associated with radiation in this setting.  The patient was encouraged to ask questions that I answered to the best of my ability.  The patient would like to proceed with radiation following her surgery.  PLAN: Patient will proceed with lumpectomy under the direction of Dr. Excell Seltzer on December 5th. Anticipate seeing the patient approximately 2-3 weeks out from her surgery for further evaluation. Anticipate radiation starting early January.     ------------------------------------------------  Blair Promise, PhD, MD  This document serves as a record of services personally performed by Gery Pray, MD. It was created on his behalf by Rae Lips, a trained medical scribe. The creation of this record is based on the scribe's personal observations and the provider's statements to them. This document has been checked and approved by the attending provider.

## 2018-10-09 ENCOUNTER — Encounter: Payer: Self-pay | Admitting: General Practice

## 2018-10-09 NOTE — Progress Notes (Signed)
[place] Psychosocial Distress Screening Clinical Social Work  Clinical Social Work was referred by distress screening protocol.  The patient scored a 8 on the Psychosocial Distress Thermometer which indicates moderate distress. Clinical Social Worker contacted patient by phone to assess for distress and other psychosocial needs. Reports distress is much less, was highly anxious when completing screen and had also had a steroid shot recently.  Feels diagnosis of cancer is relatively minor in comparison to herniated disc which is much more difficult.  Briefly described services available in [place] and encouraged to contact us as needed.    ONCBCN DISTRESS SCREENING 10/08/2018  Screening Type Initial Screening  Distress experienced in past week (1-10) 8  Family Problem type Other (comment)  Emotional problem type Depression;Nervousness/Anxiety  Spiritual/Religous concerns type Relating to God  Physical Problem type Pain    Clinical Social Worker follow up needed: No.  If yes, follow up plan:  Beverely Pace, [place], LCSW Clinical Social Worker Phone:  [phone]

## 2018-10-12 ENCOUNTER — Other Ambulatory Visit: Payer: Self-pay

## 2018-10-12 ENCOUNTER — Encounter (HOSPITAL_BASED_OUTPATIENT_CLINIC_OR_DEPARTMENT_OTHER): Payer: Self-pay | Admitting: *Deleted

## 2018-10-12 ENCOUNTER — Inpatient Hospital Stay: Payer: BLUE CROSS/BLUE SHIELD | Attending: Hematology and Oncology | Admitting: Hematology and Oncology

## 2018-10-12 ENCOUNTER — Telehealth: Payer: Self-pay | Admitting: Hematology and Oncology

## 2018-10-12 DIAGNOSIS — E079 Disorder of thyroid, unspecified: Secondary | ICD-10-CM

## 2018-10-12 DIAGNOSIS — M8580 Other specified disorders of bone density and structure, unspecified site: Secondary | ICD-10-CM

## 2018-10-12 DIAGNOSIS — G43719 Chronic migraine without aura, intractable, without status migrainosus: Secondary | ICD-10-CM | POA: Diagnosis not present

## 2018-10-12 DIAGNOSIS — Z170 Estrogen receptor positive status [ER+]: Secondary | ICD-10-CM | POA: Diagnosis not present

## 2018-10-12 DIAGNOSIS — D0511 Intraductal carcinoma in situ of right breast: Secondary | ICD-10-CM | POA: Insufficient documentation

## 2018-10-12 DIAGNOSIS — G518 Other disorders of facial nerve: Secondary | ICD-10-CM | POA: Diagnosis not present

## 2018-10-12 DIAGNOSIS — M791 Myalgia, unspecified site: Secondary | ICD-10-CM | POA: Diagnosis not present

## 2018-10-12 DIAGNOSIS — M542 Cervicalgia: Secondary | ICD-10-CM | POA: Diagnosis not present

## 2018-10-12 NOTE — Assessment & Plan Note (Signed)
09/21/2018 palpable right breast mass at 12:30 position 2.4 x 0.8 x 3 cm lipoma; screening detected right breast calcifications 1.3 cm linear orientation right breast upper outer quadrant; biopsy: DCIS high-grade, comedo pattern ER 10%, PR 0%, Tis NX stage 0  Pathology review: I discussed with the patient the difference between DCIS and invasive breast cancer. It is considered a precancerous lesion. DCIS is classified as a 0. It is generally detected through mammograms as calcifications. We discussed the significance of grades and its impact on prognosis. We also discussed the importance of ER and PR receptors and their implications to adjuvant treatment options. Prognosis of DCIS dependence on grade, comedo necrosis. It is anticipated that if not treated, 20-30% of DCIS can develop into invasive breast cancer.  Recommendation: 1. Breast conserving surgery 2. Followed by adjuvant radiation therapy 3. Followed by antiestrogen therapy with tamoxifen 5 years  Tamoxifen counseling: We discussed the risks and benefits of tamoxifen. These include but not limited to insomnia, hot flashes, mood changes, vaginal dryness, and weight gain. Although rare, serious side effects including endometrial cancer, risk of blood clots were also discussed. We strongly believe that the benefits far outweigh the risks. Patient understands these risks and consented to starting treatment. Planned treatment duration is 5 years.  Return to clinic after surgery to discuss the final pathology report and come up with an adjuvant treatment plan.

## 2018-10-12 NOTE — Telephone Encounter (Signed)
Gave patient avs and calendar.  Dr. Lindi Adie is out on Dec 12th (for the whole week).  Spoke with May, RN and she advised okay for Dec 19th.

## 2018-10-12 NOTE — Progress Notes (Signed)
[facility] NOTE  Patient Care Team: Binnie Rail, MD as PCP - General (Internal Medicine)  CHIEF COMPLAINTS/PURPOSE OF CONSULTATION:  Newly diagnosed right breast DCIS  HISTORY OF PRESENTING ILLNESS:  Tracie Hart 62 y.o. female is here because of recent diagnosis of right breast DCIS.  Patient has had a lipoma on the chest wall for a long time.  She had a routine screening mammogram that detected linear calcifications measuring 1.3 cm.  Biopsy of this revealed DCIS.  This was high-grade.  She is going to undergo lumpectomy on 10/22/2018.  She is here today to discuss overall treatment plan.  I reviewed her records extensively and collaborated the history with the patient.  SUMMARY OF ONCOLOGIC HISTORY:   Ductal carcinoma in situ (DCIS) of right breast   09/21/2018 Initial Diagnosis    Palpable right breast mass at 12:30 position 2.4 x 0.8 x 3 cm lipoma; screening detected right breast calcifications 1.3 cm linear orientation right breast upper outer quadrant; biopsy: DCIS high-grade, comedo pattern ER 10%, PR 0%, Tis NX stage 0      MEDICAL HISTORY:  Past Medical History:  Diagnosis Date  . Anemia   . Anxiety   . Depression   . GERD (gastroesophageal reflux disease)   . Migraine   . Migraine   . Osteopenia   . Sleep apnea with use of continuous positive airway pressure (CPAP)   . Thyroid disease     SURGICAL HISTORY: Past Surgical History:  Procedure Laterality Date  . GALLBLADDER SURGERY    . KNEE ARTHROSCOPY Left   . NEUROFIBROMA EXCISION  1986  . PALATE / UVULA BIOPSY / EXCISION      SOCIAL HISTORY: Social History   Socioeconomic History  . Marital status: Legally Separated    Spouse name: Not on file  . Number of children: 1  . Years of education: Not on file  . Highest education level: Not on file  Occupational History  . Occupation: retired  Scientific laboratory technician  . Financial resource strain: Not on file  . Food insecurity:   Worry: Not on file    Inability: Not on file  . Transportation needs:    Medical: Not on file    Non-medical: Not on file  Tobacco Use  . Smoking status: Never Smoker  . Smokeless tobacco: Never Used  Substance and Sexual Activity  . Alcohol use: Yes    Comment: rarely  . Drug use: No  . Sexual activity: Never  Lifestyle  . Physical activity:    Days per week: Not on file    Minutes per session: Not on file  . Stress: Not on file  Relationships  . Social connections:    Talks on phone: Not on file    Gets together: Not on file    Attends religious service: Not on file    Active member of club or organization: Not on file    Attends meetings of clubs or organizations: Not on file    Relationship status: Not on file  . Intimate partner violence:    Fear of current or ex partner: Not on file    Emotionally abused: Not on file    Physically abused: Not on file    Forced sexual activity: Not on file  Other Topics Concern  . Not on file  Social History Narrative   No regular exercise    FAMILY HISTORY: Family History  Problem Relation Age of Onset  . Arthritis  Mother   . Diabetes Mother   . Heart disease Mother   . Hypertension Mother   . Stroke Mother   . Arthritis Father   . Depression Father   . Diabetes Father   . Hyperlipidemia Father   . Hypertension Father   . Depression Brother   . Hypertension Brother   . Heart disease Maternal Grandmother   . COPD Paternal Grandmother   . COPD Paternal Grandfather   . Colon cancer Paternal Grandfather     ALLERGIES:  is allergic to codeine.  MEDICATIONS:  Current Outpatient Medications  Medication Sig Dispense Refill  . calcium-vitamin D (OSCAL WITH D) 500-200 MG-UNIT tablet Take 1 tablet by mouth.    . COLLAGEN PO Take by mouth.    . escitalopram (LEXAPRO) 20 MG tablet Take 1 tablet (20 mg total) by mouth daily. (Patient taking differently: Take 30 mg by mouth daily. ) 90 tablet 3  . lansoprazole (PREVACID) 30 MG  capsule Take 1 capsule (30 mg total) by mouth daily at 12 noon. 90 capsule 3  . levothyroxine (SYNTHROID, LEVOTHROID) 50 MCG tablet Take 1 tablet (50 mcg total) by mouth daily before breakfast. 90 tablet 3  . Magnesium 400 MG TABS Take 200 mg by mouth daily.     . SUMAtriptan (IMITREX) 100 MG tablet Take 1 tablet (100 mg total) by mouth every 2 (two) hours as needed for migraine. May repeat in 2 hours if headache persists or recurs. 10 tablet 0  . vitamin B-12 (CYANOCOBALAMIN) 500 MCG tablet Take 1,000 mcg by mouth daily.      No current facility-administered medications for this visit.     REVIEW OF SYSTEMS:   Constitutional: Denies fevers, chills or abnormal night sweats Eyes: Denies blurriness of vision, double vision or watery eyes Ears, nose, mouth, throat, and face: Denies mucositis or sore throat Respiratory: Denies cough, dyspnea or wheezes Cardiovascular: Denies palpitation, chest discomfort or lower extremity swelling Gastrointestinal:  Denies nausea, heartburn or change in bowel habits Skin: Denies abnormal skin rashes Lymphatics: Denies new lymphadenopathy or easy bruising Neurological:Denies numbness, tingling or new weaknesses Behavioral/Psych: Mood is stable, no new changes  Breast: Lipoma in the breast All other systems were reviewed with the patient and are negative.  PHYSICAL EXAMINATION: ECOG PERFORMANCE STATUS: 0 - Asymptomatic  Vitals:   10/12/18 1304  BP: (!) 156/29  Pulse: 84  Resp: 17  Temp: 97.9 F (36.6 C)  SpO2: 100%   Filed Weights   10/12/18 1304  Weight: 146 lb 9.6 oz (66.5 kg)    GENERAL:alert, no distress and comfortable SKIN: skin color, texture, turgor are normal, no rashes or significant lesions EYES: normal, conjunctiva are pink and non-injected, sclera clear OROPHARYNX:no exudate, no erythema and lips, buccal mucosa, and tongue normal  NECK: supple, thyroid normal size, non-tender, without nodularity LYMPH:  no palpable lymphadenopathy  in the cervical, axillary or inguinal LUNGS: clear to auscultation and percussion with normal breathing effort HEART: regular rate & rhythm and no murmurs and no lower extremity edema ABDOMEN:abdomen soft, non-tender and normal bowel sounds Musculoskeletal:no cyanosis of digits and no clubbing  PSYCH: alert & oriented x 3 with fluent speech NEURO: no focal motor/sensory deficits BREAST: No palpable nodules in breast other than lipoma. No palpable axillary or supraclavicular lymphadenopathy (exam performed in the presence of a chaperone)   LABORATORY DATA:  I have reviewed the data as listed Lab Results  Component Value Date   WBC 5.7 03/17/2018   HGB 14.5 03/17/2018  HCT 43.0 03/17/2018   MCV 82.7 03/17/2018   PLT 130.0 (L) 03/17/2018   Lab Results  Component Value Date   NA 140 03/17/2018   K 3.9 03/17/2018   CL 102 03/17/2018   CO2 31 03/17/2018    RADIOGRAPHIC STUDIES: I have personally reviewed the radiological reports and agreed with the findings in the report.  ASSESSMENT AND PLAN:  Ductal carcinoma in situ (DCIS) of right breast 09/21/2018 palpable right breast mass at 12:30 position 2.4 x 0.8 x 3 cm lipoma; screening detected right breast calcifications 1.3 cm linear orientation right breast upper outer quadrant; biopsy: DCIS high-grade, comedo pattern ER 10%, PR 0%, Tis NX stage 0  Pathology review: I discussed with the patient the difference between DCIS and invasive breast cancer. It is considered a precancerous lesion. DCIS is classified as a 0. It is generally detected through mammograms as calcifications. We discussed the significance of grades and its impact on prognosis. We also discussed the importance of ER and PR receptors and their implications to adjuvant treatment options. Prognosis of DCIS dependence on grade, comedo necrosis. It is anticipated that if not treated, 20-30% of DCIS can develop into invasive breast cancer.  Recommendation: 1. Breast conserving  surgery 2. Followed by adjuvant radiation therapy 3. Followed by antiestrogen therapy with tamoxifen 5 years  Tamoxifen counseling: We discussed the risks and benefits of tamoxifen. These include but not limited to insomnia, hot flashes, mood changes, vaginal dryness, and weight gain. Although rare, serious side effects including endometrial cancer, risk of blood clots were also discussed. We strongly believe that the benefits far outweigh the risks. Patient understands these risks and consented to starting treatment. Planned treatment duration is 5 years.  Return to clinic after surgery to discuss the final pathology report and come up with an adjuvant treatment plan.      All questions were answered. The patient knows to call the clinic with any problems, questions or concerns.    Harriette Ohara, MD 10/12/18

## 2018-10-13 ENCOUNTER — Encounter: Payer: Self-pay | Admitting: *Deleted

## 2018-10-13 DIAGNOSIS — G43719 Chronic migraine without aura, intractable, without status migrainosus: Secondary | ICD-10-CM | POA: Diagnosis not present

## 2018-10-17 ENCOUNTER — Encounter: Payer: Self-pay | Admitting: Internal Medicine

## 2018-10-20 DIAGNOSIS — M5136 Other intervertebral disc degeneration, lumbar region: Secondary | ICD-10-CM | POA: Diagnosis not present

## 2018-10-20 DIAGNOSIS — S39012D Strain of muscle, fascia and tendon of lower back, subsequent encounter: Secondary | ICD-10-CM | POA: Diagnosis not present

## 2018-10-20 DIAGNOSIS — M5416 Radiculopathy, lumbar region: Secondary | ICD-10-CM | POA: Diagnosis not present

## 2018-10-20 NOTE — Progress Notes (Signed)
Ensure pre surgery drink given with instructions to complete by 0430 dos, surgical soap given with instructions, pt verbalized understanding. 

## 2018-10-21 ENCOUNTER — Ambulatory Visit
Admission: RE | Admit: 2018-10-21 | Discharge: 2018-10-21 | Disposition: A | Discharge status: Home / Self Care | Payer: BLUE CROSS/BLUE SHIELD | Source: Ambulatory Visit | Attending: General Surgery | Admitting: General Surgery

## 2018-10-21 DIAGNOSIS — D0511 Intraductal carcinoma in situ of right breast: Secondary | ICD-10-CM

## 2018-10-21 DIAGNOSIS — D0512 Intraductal carcinoma in situ of left breast: Secondary | ICD-10-CM | POA: Diagnosis not present

## 2018-10-21 NOTE — H&P (Signed)
History of Present Illness Marland Kitchen T. Sephora Boyar MD; 09/24/2018 10:12 AM) The patient is a 62 year old female who presents with breast cancer. Patient is a postmenopausal 62 year old female referred by Dr. Nolon Nations for a new diagnosis of DCIS. Patient has had yearly mammograms for a number of years but not since February 2017. Recently moved from [place]. She presented for screening mammogram recently which revealed some possible distortion in the left breast and some new calcifications in the right breast. Subsequently diagnostic mammogram was performed confirming a 1.3 cm group of suspicious calcifications in the upper outer right breast. There was no persistent distortion or mass in the left breast on diagnostic imaging. Ultrasound was performed due to a long-standing palpable abnormality in the upper chest high at the edge of the breast previously diagnosed as a lipoma. This confirmed an approximately 1.8 cm typical appearing lipoma. No other abnormalities on ultrasound. Large core needle biopsy was performed stereotactically on September 21, 2018 which has revealed high-grade ductal carcinoma in situ with comedonecrosis. Hormone receptors are pending. She has had a little soreness under her right arm but no significant symptoms such as nipple discharge or skin changes. The soft palpable mass just on the upper edge of the breast has been stable to her over a number of years.   Problem List/Past Medical Marland Kitchen T. Cathyann Kilfoyle, MD; 09/24/2018 10:33 AM) LIPOMA (D17.9)  BREAST NEOPLASM, TIS (DCIS), RIGHT (D05.11)   Past Surgical History Marland Kitchen T. Kaidynce Pfister, MD; 09/24/2018 10:33 AM) Gallbladder Surgery - Open  Knee Surgery  Left. Spinal Surgery - Lower Back   Diagnostic Studies History Marland Kitchen T. Alyssah Algeo, MD; 09/24/2018 10:33 AM) Colonoscopy  5-10 years ago Mammogram  1-3 years ago Pap Smear  1-5 years ago  Allergies (Tanisha A. Owens Shark, [place]; 09/24/2018 9:41 AM) No Known  Drug Allergies [01/21/2018]: Allergies Reconciled   Medication History (Tanisha A. Owens Shark, [place]; 09/24/2018 9:43 AM) Vitamin B12 (1000MCG Tablet ER, Oral) Active. Magnesium (200MG  Tablet, Oral) Active. Citracal (Oral) Specific strength unknown - Active. Levothyroxine Sodium (50MCG Tablet, Oral) Active. Lansoprazole (30MG  Capsule DR, Oral) Active. Escitalopram Oxalate (20MG  Tablet, Oral) Active. Imitrex (100MG  Tablet, Oral) Active. Medications Reconciled  Social History Marland Kitchen T. Usher Hedberg, MD; 09/24/2018 10:33 AM) Alcohol use  Moderate alcohol use. Caffeine use  Coffee, Tea. No drug use  Tobacco use  Never smoker.  Family History Marland Kitchen T. Nadiyah Zeis, MD; 09/24/2018 10:33 AM) Arthritis  Father, Mother. Cerebrovascular Accident  Mother. Depression  Brother, Father. Diabetes Mellitus  Father, Mother. Hypertension  Father, Mother. Migraine Headache  Mother, Son.  Pregnancy / Birth History Marland Kitchen T. Nyelle Wolfson, MD; 09/24/2018 10:33 AM) Age at menarche  12 years. Age of menopause  74-60 Gravida  1 Length (months) of breastfeeding  3-6 Maternal age  >62 Para  1  Other Problems Marland Kitchen T. Keaun Schnabel, MD; 09/24/2018 10:33 AM) Bladder Problems  Depression  Gastroesophageal Reflux Disease  Migraine Headache  Other disease, cancer, significant illness  Sleep Apnea  Thyroid Disease   Vitals (Tanisha A. Brown RMA; 09/24/2018 9:41 AM) 09/24/2018 9:41 AM Weight: 146.4 lb Height: 64in Body Surface Area: 1.71 m Body Mass Index: 25.13 kg/m  Temp.: 97.58F  Pulse: 88 (Regular)  BP: 132/84 (Sitting, Left Arm, Standard)       Physical Exam Marland Kitchen T. Kenan Moodie MD; 09/24/2018 10:30 AM) The physical exam findings are as follows: Note:General: Alert, well-developed and well nourished Caucasian female, in no distress Skin: Warm and dry without rash or infection. HEENT: No palpable masses or thyromegaly. Sclera nonicteric. Lymph  nodes: No  cervical, supraclavicular, nodes palpable. Breasts: On the right upper chest wall probably just above the breast tissue is a approximately 2 cm vertically oriented linear soft rubbery mass consistent with lipoma. No other palpable abnormalities in either breast. No skin changes or nipple crusting. No palpable axillary adenopathy. Lungs: Breath sounds clear and equal. No wheezing or increased work of breathing. Cardiovascular: Regular rate and rhythm without murmer. No JVD or edema. Peripheral pulses intact. No carotid bruits. Extremities: No edema or joint swelling or deformity. No chronic venous stasis changes. Neurologic: Alert and fully oriented. Gait normal. No focal weakness. Psychiatric: Normal mood and affect. Thought content appropriate with normal judgement and insight    Assessment & Plan Marland Kitchen T. Kwabena Strutz MD; 09/24/2018 10:33 AM) BREAST NEOPLASM, TIS (DCIS), RIGHT (D05.11) Impression: 62 year old female with new diagnosis of high-grade DCIS upper outer quadrant right breast measuring 1.3 cm a stone calcifications. I discussed the diagnosis with the patient and her husband in detail. I recommended radioactive seed localized right breast lumpectomy under general anesthesia as an outpatient. We discussed in general terms postoperative radiation therapy and possible chemotherapy prevention. We will refer to medical and radiation oncology. I discussed the surgery in detail including its nature and expected recovery. Discussed risks of anesthetic complications, bleeding, infection and possible need for further surgery based on positive margins. All her questions were answered and she is ready to proceed with scheduling. Current Plans Referred to Oncology, for evaluation and follow up (Oncology). Routine. Referred to Radiation Oncology, for evaluation and follow up (Radiation Oncology). Routine. Radioactive seed localized right breast lumpectomy under general anesthesia as an outpatient

## 2018-10-21 NOTE — Anesthesia Preprocedure Evaluation (Addendum)
Anesthesia Evaluation  Patient identified by MRN, date of birth, ID band Patient awake    Reviewed: Allergy & Precautions, H&P , NPO status , Patient's Chart, lab work & pertinent test results, reviewed documented beta blocker date and time   Airway Mallampati: III  TM Distance: >3 FB Neck ROM: full    Dental no notable dental hx. (+) Teeth Intact   Pulmonary neg pulmonary ROS, sleep apnea and Continuous Positive Airway Pressure Ventilation ,    Pulmonary exam normal breath sounds clear to auscultation       Cardiovascular Exercise Tolerance: Good negative cardio ROS   Rhythm:regular Rate:Normal     Neuro/Psych  Headaches, negative neurological ROS  negative psych ROS   GI/Hepatic negative GI ROS, Neg liver ROS, GERD  ,  Endo/Other  negative endocrine ROSHypothyroidism   Renal/GU negative Renal ROS  negative genitourinary   Musculoskeletal   Abdominal   Peds  Hematology negative hematology ROS (+) Blood dyscrasia, anemia ,   Anesthesia Other Findings   Reproductive/Obstetrics negative OB ROS                            Anesthesia Physical Anesthesia Plan  ASA: II  Anesthesia Plan: General   Post-op Pain Management:    Induction: Intravenous  PONV Risk Score and Plan: 3 and Dexamethasone, Ondansetron and Treatment may vary due to age or medical condition  Airway Management Planned: LMA  Additional Equipment:   Intra-op Plan:   Post-operative Plan:   Informed Consent: I have reviewed the patients History and Physical, chart, labs and discussed the procedure including the risks, benefits and alternatives for the proposed anesthesia with the patient or authorized representative who has indicated his/her understanding and acceptance.   Dental Advisory Given  Plan Discussed with: CRNA, Surgeon and Anesthesiologist  Anesthesia Plan Comments: ( )       Anesthesia Quick  Evaluation

## 2018-10-22 ENCOUNTER — Ambulatory Visit (HOSPITAL_BASED_OUTPATIENT_CLINIC_OR_DEPARTMENT_OTHER): Payer: BLUE CROSS/BLUE SHIELD | Admitting: Anesthesiology

## 2018-10-22 ENCOUNTER — Other Ambulatory Visit: Payer: Self-pay

## 2018-10-22 ENCOUNTER — Encounter (HOSPITAL_BASED_OUTPATIENT_CLINIC_OR_DEPARTMENT_OTHER)
Admission: RE | Disposition: A | Discharge status: Home / Self Care | Payer: Self-pay | Source: Ambulatory Visit | Attending: General Surgery

## 2018-10-22 ENCOUNTER — Ambulatory Visit
Admission: RE | Admit: 2018-10-22 | Discharge: 2018-10-22 | Disposition: A | Discharge status: Home / Self Care | Payer: BLUE CROSS/BLUE SHIELD | Source: Ambulatory Visit | Attending: General Surgery | Admitting: General Surgery

## 2018-10-22 ENCOUNTER — Encounter (HOSPITAL_BASED_OUTPATIENT_CLINIC_OR_DEPARTMENT_OTHER): Payer: Self-pay | Admitting: *Deleted

## 2018-10-22 ENCOUNTER — Ambulatory Visit (HOSPITAL_BASED_OUTPATIENT_CLINIC_OR_DEPARTMENT_OTHER)
Admission: RE | Admit: 2018-10-22 | Discharge: 2018-10-22 | Disposition: A | Discharge status: Home / Self Care | Payer: BLUE CROSS/BLUE SHIELD | Source: Ambulatory Visit | Attending: General Surgery | Admitting: General Surgery

## 2018-10-22 DIAGNOSIS — Z79899 Other long term (current) drug therapy: Secondary | ICD-10-CM | POA: Diagnosis not present

## 2018-10-22 DIAGNOSIS — Z8249 Family history of ischemic heart disease and other diseases of the circulatory system: Secondary | ICD-10-CM | POA: Diagnosis not present

## 2018-10-22 DIAGNOSIS — D649 Anemia, unspecified: Secondary | ICD-10-CM | POA: Diagnosis not present

## 2018-10-22 DIAGNOSIS — Z818 Family history of other mental and behavioral disorders: Secondary | ICD-10-CM | POA: Diagnosis not present

## 2018-10-22 DIAGNOSIS — Z8261 Family history of arthritis: Secondary | ICD-10-CM | POA: Insufficient documentation

## 2018-10-22 DIAGNOSIS — Z820 Family history of epilepsy and other diseases of the nervous system: Secondary | ICD-10-CM | POA: Insufficient documentation

## 2018-10-22 DIAGNOSIS — Z823 Family history of stroke: Secondary | ICD-10-CM | POA: Diagnosis not present

## 2018-10-22 DIAGNOSIS — G4730 Sleep apnea, unspecified: Secondary | ICD-10-CM | POA: Diagnosis not present

## 2018-10-22 DIAGNOSIS — R928 Other abnormal and inconclusive findings on diagnostic imaging of breast: Secondary | ICD-10-CM | POA: Diagnosis not present

## 2018-10-22 DIAGNOSIS — Z9049 Acquired absence of other specified parts of digestive tract: Secondary | ICD-10-CM | POA: Diagnosis not present

## 2018-10-22 DIAGNOSIS — G43909 Migraine, unspecified, not intractable, without status migrainosus: Secondary | ICD-10-CM | POA: Insufficient documentation

## 2018-10-22 DIAGNOSIS — Z833 Family history of diabetes mellitus: Secondary | ICD-10-CM | POA: Insufficient documentation

## 2018-10-22 DIAGNOSIS — K219 Gastro-esophageal reflux disease without esophagitis: Secondary | ICD-10-CM | POA: Diagnosis not present

## 2018-10-22 DIAGNOSIS — D0511 Intraductal carcinoma in situ of right breast: Secondary | ICD-10-CM | POA: Insufficient documentation

## 2018-10-22 DIAGNOSIS — E039 Hypothyroidism, unspecified: Secondary | ICD-10-CM | POA: Diagnosis not present

## 2018-10-22 HISTORY — PX: BREAST LUMPECTOMY WITH RADIOACTIVE SEED LOCALIZATION: SHX6424

## 2018-10-22 HISTORY — PX: BREAST LUMPECTOMY: SHX2

## 2018-10-22 SURGERY — BREAST LUMPECTOMY WITH RADIOACTIVE SEED LOCALIZATION
Anesthesia: General | Site: Breast | Laterality: Right

## 2018-10-22 MED ORDER — MIDAZOLAM HCL 2 MG/2ML IJ SOLN
INTRAMUSCULAR | Status: AC
  Filled 2018-10-22: qty 2

## 2018-10-22 MED ORDER — ACETAMINOPHEN 325 MG PO TABS: 325.0000 mg | ORAL_TABLET | ORAL | Status: DC | PRN

## 2018-10-22 MED ORDER — MEPERIDINE HCL 25 MG/ML IJ SOLN: 6.2500 mg | INTRAMUSCULAR | Status: DC | PRN

## 2018-10-22 MED ORDER — PROPOFOL 500 MG/50ML IV EMUL
INTRAVENOUS | Status: AC
  Filled 2018-10-22: qty 50

## 2018-10-22 MED ORDER — DEXAMETHASONE SODIUM PHOSPHATE 4 MG/ML IJ SOLN
INTRAMUSCULAR | Status: DC | PRN
  Administered 2018-10-22: 10 mg via INTRAVENOUS

## 2018-10-22 MED ORDER — CHLORHEXIDINE GLUCONATE CLOTH 2 % EX PADS: 6.0000 | MEDICATED_PAD | Freq: Once | CUTANEOUS | Status: DC

## 2018-10-22 MED ORDER — ACETAMINOPHEN 500 MG PO TABS
ORAL_TABLET | ORAL | Status: AC
  Filled 2018-10-22: qty 2

## 2018-10-22 MED ORDER — CELECOXIB 200 MG PO CAPS
200.0000 mg | ORAL_CAPSULE | ORAL | Status: AC
  Administered 2018-10-22: 200 mg via ORAL

## 2018-10-22 MED ORDER — BUPIVACAINE-EPINEPHRINE (PF) 0.25% -1:200000 IJ SOLN
INTRAMUSCULAR | Status: DC | PRN
  Administered 2018-10-22: 30 mL

## 2018-10-22 MED ORDER — MIDAZOLAM HCL 2 MG/2ML IJ SOLN
1.0000 mg | INTRAMUSCULAR | Status: DC | PRN
  Administered 2018-10-22: 2 mg via INTRAVENOUS

## 2018-10-22 MED ORDER — FENTANYL CITRATE (PF) 100 MCG/2ML IJ SOLN
INTRAMUSCULAR | Status: AC
  Filled 2018-10-22: qty 2

## 2018-10-22 MED ORDER — OXYCODONE HCL 5 MG PO TABS: 5.0000 mg | ORAL_TABLET | Freq: Four times a day (QID) | ORAL | 0 refills | Status: DC | PRN

## 2018-10-22 MED ORDER — PROPOFOL 10 MG/ML IV BOLUS
INTRAVENOUS | Status: DC | PRN
  Administered 2018-10-22: 150 mg via INTRAVENOUS

## 2018-10-22 MED ORDER — BUPIVACAINE-EPINEPHRINE (PF) 0.25% -1:200000 IJ SOLN
INTRAMUSCULAR | Status: AC
  Filled 2018-10-22: qty 30

## 2018-10-22 MED ORDER — FENTANYL CITRATE (PF) 100 MCG/2ML IJ SOLN
50.0000 ug | INTRAMUSCULAR | Status: DC | PRN
  Administered 2018-10-22 (×2): 50 ug via INTRAVENOUS

## 2018-10-22 MED ORDER — LIDOCAINE 2% (20 MG/ML) 5 ML SYRINGE
INTRAMUSCULAR | Status: DC | PRN
  Administered 2018-10-22: 60 mg via INTRAVENOUS

## 2018-10-22 MED ORDER — GABAPENTIN 300 MG PO CAPS
ORAL_CAPSULE | ORAL | Status: AC
  Filled 2018-10-22: qty 1

## 2018-10-22 MED ORDER — OXYCODONE HCL 5 MG/5ML PO SOLN: 5.0000 mg | Freq: Once | ORAL | Status: DC | PRN

## 2018-10-22 MED ORDER — CELECOXIB 200 MG PO CAPS
ORAL_CAPSULE | ORAL | Status: AC
  Filled 2018-10-22: qty 1

## 2018-10-22 MED ORDER — LIDOCAINE 2% (20 MG/ML) 5 ML SYRINGE
INTRAMUSCULAR | Status: AC
  Filled 2018-10-22: qty 5

## 2018-10-22 MED ORDER — ONDANSETRON HCL 4 MG/2ML IJ SOLN
INTRAMUSCULAR | Status: AC
  Filled 2018-10-22: qty 2

## 2018-10-22 MED ORDER — ACETAMINOPHEN 500 MG PO TABS
1000.0000 mg | ORAL_TABLET | ORAL | Status: AC
  Administered 2018-10-22: 1000 mg via ORAL

## 2018-10-22 MED ORDER — CEFAZOLIN SODIUM-DEXTROSE 2-4 GM/100ML-% IV SOLN
INTRAVENOUS | Status: AC
  Filled 2018-10-22: qty 100

## 2018-10-22 MED ORDER — SCOPOLAMINE 1 MG/3DAYS TD PT72: 1.0000 | MEDICATED_PATCH | Freq: Once | TRANSDERMAL | Status: DC | PRN

## 2018-10-22 MED ORDER — DEXAMETHASONE SODIUM PHOSPHATE 10 MG/ML IJ SOLN
INTRAMUSCULAR | Status: AC
  Filled 2018-10-22: qty 1

## 2018-10-22 MED ORDER — BUPIVACAINE LIPOSOME 1.3 % IJ SUSP: 20.0000 mL | Freq: Once | INTRAMUSCULAR | Status: DC

## 2018-10-22 MED ORDER — ONDANSETRON HCL 4 MG/2ML IJ SOLN
INTRAMUSCULAR | Status: DC | PRN
  Administered 2018-10-22: 4 mg via INTRAVENOUS

## 2018-10-22 MED ORDER — EPHEDRINE SULFATE-NACL 50-0.9 MG/10ML-% IV SOSY
PREFILLED_SYRINGE | INTRAVENOUS | Status: DC | PRN
  Administered 2018-10-22 (×2): 10 mg via INTRAVENOUS

## 2018-10-22 MED ORDER — GABAPENTIN 300 MG PO CAPS
300.0000 mg | ORAL_CAPSULE | ORAL | Status: AC
  Administered 2018-10-22: 300 mg via ORAL

## 2018-10-22 MED ORDER — LACTATED RINGERS IV SOLN
INTRAVENOUS | Status: DC
  Administered 2018-10-22: 07:00:00 via INTRAVENOUS

## 2018-10-22 MED ORDER — OXYCODONE HCL 5 MG PO TABS: 5.0000 mg | ORAL_TABLET | Freq: Once | ORAL | Status: DC | PRN

## 2018-10-22 MED ORDER — ACETAMINOPHEN 160 MG/5ML PO SOLN: 325.0000 mg | ORAL | Status: DC | PRN

## 2018-10-22 MED ORDER — FENTANYL CITRATE (PF) 100 MCG/2ML IJ SOLN
25.0000 ug | INTRAMUSCULAR | Status: DC | PRN
  Administered 2018-10-22: 50 ug via INTRAVENOUS

## 2018-10-22 MED ORDER — ONDANSETRON HCL 4 MG/2ML IJ SOLN: 4.0000 mg | Freq: Once | INTRAMUSCULAR | Status: DC | PRN

## 2018-10-22 MED ORDER — BUPIVACAINE HCL (PF) 0.5 % IJ SOLN
INTRAMUSCULAR | Status: AC
  Filled 2018-10-22: qty 30

## 2018-10-22 MED ORDER — CEFAZOLIN SODIUM-DEXTROSE 2-4 GM/100ML-% IV SOLN
2.0000 g | INTRAVENOUS | Status: AC
  Administered 2018-10-22: 2 g via INTRAVENOUS

## 2018-10-22 SURGICAL SUPPLY — 50 items
BINDER BREAST LRG (GAUZE/BANDAGES/DRESSINGS) ×2 IMPLANT
BINDER BREAST MEDIUM (GAUZE/BANDAGES/DRESSINGS) IMPLANT
BINDER BREAST XLRG (GAUZE/BANDAGES/DRESSINGS) IMPLANT
BINDER BREAST XXLRG (GAUZE/BANDAGES/DRESSINGS) IMPLANT
BLADE SURG 15 STRL LF DISP TIS (BLADE) ×1 IMPLANT
BLADE SURG 15 STRL SS (BLADE) ×1
CANISTER SUC SOCK COL 7IN (MISCELLANEOUS) IMPLANT
CANISTER SUCT 1200ML W/VALVE (MISCELLANEOUS) IMPLANT
CHLORAPREP W/TINT 26ML (MISCELLANEOUS) ×2 IMPLANT
CLIP VESOCCLUDE SM WIDE 6/CT (CLIP) ×2 IMPLANT
COVER BACK TABLE 60X90IN (DRAPES) ×2 IMPLANT
COVER MAYO STAND STRL (DRAPES) ×2 IMPLANT
COVER PROBE W GEL 5X96 (DRAPES) ×2 IMPLANT
COVER WAND RF STERILE (DRAPES) IMPLANT
DECANTER SPIKE VIAL GLASS SM (MISCELLANEOUS) IMPLANT
DERMABOND ADVANCED (GAUZE/BANDAGES/DRESSINGS) ×1
DERMABOND ADVANCED .7 DNX12 (GAUZE/BANDAGES/DRESSINGS) ×1 IMPLANT
DEVICE DUBIN W/COMP PLATE 8390 (MISCELLANEOUS) ×2 IMPLANT
DRAPE LAPAROSCOPIC ABDOMINAL (DRAPES) ×2 IMPLANT
DRAPE UTILITY XL STRL (DRAPES) ×2 IMPLANT
ELECT COATED BLADE 2.86 ST (ELECTRODE) ×2 IMPLANT
ELECT REM PT RETURN 9FT ADLT (ELECTROSURGICAL) ×2
ELECTRODE REM PT RTRN 9FT ADLT (ELECTROSURGICAL) ×1 IMPLANT
GLOVE BIOGEL PI IND STRL 8 (GLOVE) ×1 IMPLANT
GLOVE BIOGEL PI IND STRL 8.5 (GLOVE) ×1 IMPLANT
GLOVE BIOGEL PI INDICATOR 8 (GLOVE) ×1
GLOVE BIOGEL PI INDICATOR 8.5 (GLOVE) ×1
GLOVE ECLIPSE 7.5 STRL STRAW (GLOVE) ×2 IMPLANT
GLOVE SURG SS PI 8.5 STRL IVOR (GLOVE) ×1
GLOVE SURG SS PI 8.5 STRL STRW (GLOVE) ×1 IMPLANT
GOWN STRL REUS W/ TWL LRG LVL3 (GOWN DISPOSABLE) ×1 IMPLANT
GOWN STRL REUS W/ TWL XL LVL3 (GOWN DISPOSABLE) ×1 IMPLANT
GOWN STRL REUS W/TWL LRG LVL3 (GOWN DISPOSABLE) ×1
GOWN STRL REUS W/TWL XL LVL3 (GOWN DISPOSABLE) ×1
ILLUMINATOR WAVEGUIDE N/F (MISCELLANEOUS) IMPLANT
KIT MARKER MARGIN INK (KITS) ×2 IMPLANT
LIGHT WAVEGUIDE WIDE FLAT (MISCELLANEOUS) IMPLANT
NEEDLE HYPO 25X1 1.5 SAFETY (NEEDLE) ×2 IMPLANT
NS IRRIG 1000ML POUR BTL (IV SOLUTION) ×2 IMPLANT
PACK BASIN DAY SURGERY FS (CUSTOM PROCEDURE TRAY) ×2 IMPLANT
PENCIL BUTTON HOLSTER BLD 10FT (ELECTRODE) ×2 IMPLANT
SLEEVE SCD COMPRESS KNEE MED (MISCELLANEOUS) ×2 IMPLANT
SPONGE LAP 4X18 RFD (DISPOSABLE) ×2 IMPLANT
SUT MON AB 5-0 PS2 18 (SUTURE) ×2 IMPLANT
SUT VICRYL 3-0 CR8 SH (SUTURE) ×2 IMPLANT
SYR CONTROL 10ML LL (SYRINGE) ×2 IMPLANT
TOWEL GREEN STERILE FF (TOWEL DISPOSABLE) ×2 IMPLANT
TOWEL OR NON WOVEN STRL DISP B (DISPOSABLE) ×2 IMPLANT
TUBE CONNECTING 20X1/4 (TUBING) IMPLANT
YANKAUER SUCT BULB TIP NO VENT (SUCTIONS) IMPLANT

## 2018-10-22 NOTE — Discharge Instructions (Signed)
Post Anesthesia Home Care Instructions  Activity: Get plenty of rest for the remainder of the day. A responsible individual must stay with you for 24 hours following the procedure.  For the next 24 hours, DO NOT: -Drive a car -Paediatric nurse -Drink alcoholic beverages -Take any medication unless instructed by your physician -Make any legal decisions or sign important papers.  Meals: Start with liquid foods such as gelatin or soup. Progress to regular foods as tolerated. Avoid greasy, spicy, heavy foods. If nausea and/or vomiting occur, drink only clear liquids until the nausea and/or vomiting subsides. Call your physician if vomiting continues.  Special Instructions/Symptoms: Your throat may feel dry or sore from the anesthesia or the breathing tube placed in your throat during surgery. If this causes discomfort, gargle with warm salt water. The discomfort should disappear within 24 hours.  If you had a scopolamine patch placed behind your ear for the management of post- operative nausea and/or vomiting:  1. The medication in the patch is effective for 72 hours, after which it should be removed.  Wrap patch in a tissue and discard in the trash. Wash hands thoroughly with soap and water. 2. You may remove the patch earlier than 72 hours if you experience unpleasant side effects which may include dry mouth, dizziness or visual disturbances. 3. Avoid touching the patch. Wash your hands with soap and water after contact with the patch.       [facility] Office Phone Number [phone]  BREAST BIOPSY/ PARTIAL MASTECTOMY: POST OP INSTRUCTIONS  Always review your discharge instruction sheet given to you by the facility where your surgery was performed.  IF YOU HAVE DISABILITY OR FAMILY LEAVE FORMS, YOU MUST BRING THEM TO THE OFFICE FOR PROCESSING.  DO NOT GIVE THEM TO YOUR DOCTOR.  1. A prescription for pain medication may be given to you upon discharge.  Take your  pain medication as prescribed, if needed.  If narcotic pain medicine is not needed, then you may take acetaminophen (Tylenol) or ibuprofen (Advil) as needed. No Tylenol until 12:45pm. No ibuprofen until 2:45pm.  2. Take your usually prescribed medications unless otherwise directed 3. If you need a refill on your pain medication, please contact your pharmacy.  They will contact our office to request authorization.  Prescriptions will not be filled after 5pm or on week-ends. 4. You should eat very light the first 24 hours after surgery, such as soup, crackers, pudding, etc.  Resume your normal diet the day after surgery. 5. Most patients will experience some swelling and bruising in the breast.  Ice packs and a good support bra will help.  Swelling and bruising can take several days to resolve.  6. It is common to experience some constipation if taking pain medication after surgery.  Increasing fluid intake and taking a stool softener will usually help or prevent this problem from occurring.  A mild laxative (Milk of Magnesia or Miralax) should be taken according to package directions if there are no bowel movements after 48 hours. 7. Unless discharge instructions indicate otherwise, you may remove your bandages 24-48 hours after surgery, and you may shower at that time.  You may have steri-strips (small skin tapes) in place directly over the incision.  These strips should be left on the skin for 7-10 days.  If your surgeon used skin glue on the incision, you may shower in 24 hours.  The glue will flake off over the next 2-3 weeks.  Any sutures or staples will  be removed at the office during your follow-up visit. 8. ACTIVITIES:  You may resume regular daily activities (gradually increasing) beginning the next day.  Wearing a good support bra or sports bra minimizes pain and swelling.  You may have sexual intercourse when it is comfortable. a. You may drive when you no longer are taking prescription pain  medication, you can comfortably wear a seatbelt, and you can safely maneuver your car and apply brakes. b. RETURN TO WORK:  ______________________________________________________________________________________ 9. You should see your doctor in the office for a follow-up appointment approximately two weeks after your surgery.  Your doctors nurse will typically make your follow-up appointment when she calls you with your pathology report.  Expect your pathology report 2-3 business days after your surgery.  You may call to check if you do not hear from [place] after three days. 10. OTHER INSTRUCTIONS: _______________________________________________________________________________________________ _____________________________________________________________________________________________________________________________________ _____________________________________________________________________________________________________________________________________ _____________________________________________________________________________________________________________________________________  WHEN TO CALL YOUR DOCTOR: 1. Fever over 101.0 2. Nausea and/or vomiting. 3. Extreme swelling or bruising. 4. Continued bleeding from incision. 5. Increased pain, redness, or drainage from the incision.  The clinic staff is available to answer your questions during regular business hours.  Please dont hesitate to call and ask to speak to one of the nurses for clinical concerns.  If you have a medical emergency, go to the nearest emergency room or call 911.  A surgeon from [facility] is always on call at the hospital.  For further questions, please visit [email or web address]     Post Anesthesia Home Care Instructions  Activity: Get plenty of rest for the remainder of the day. A responsible individual must stay with you for 24 hours following the procedure.  For the next 24 hours, DO  NOT: -Drive a car -Paediatric nurse -Drink alcoholic beverages -Take any medication unless instructed by your physician -Make any legal decisions or sign important papers.  Meals: Start with liquid foods such as gelatin or soup. Progress to regular foods as tolerated. Avoid greasy, spicy, heavy foods. If nausea and/or vomiting occur, drink only clear liquids until the nausea and/or vomiting subsides. Call your physician if vomiting continues.  Special Instructions/Symptoms: Your throat may feel dry or sore from the anesthesia or the breathing tube placed in your throat during surgery. If this causes discomfort, gargle with warm salt water. The discomfort should disappear within 24 hours.  If you had a scopolamine patch placed behind your ear for the management of post- operative nausea and/or vomiting:  1. The medication in the patch is effective for 72 hours, after which it should be removed.  Wrap patch in a tissue and discard in the trash. Wash hands thoroughly with soap and water. 2. You may remove the patch earlier than 72 hours if you experience unpleasant side effects which may include dry mouth, dizziness or visual disturbances. 3. Avoid touching the patch. Wash your hands with soap and water after contact with the patch.

## 2018-10-22 NOTE — Interval H&P Note (Signed)
History and Physical Interval Note:  10/22/2018 7:22 AM  Tracie Hart  has presented today for surgery, with the diagnosis of DUCTAL CARCINOMA IN SITU RIGHT BREAST  The various methods of treatment have been discussed with the patient and family. After consideration of risks, benefits and other options for treatment, the patient has consented to  Procedure(s): BREAST LUMPECTOMY WITH RADIOACTIVE SEED LOCALIZATION (Right) as a surgical intervention .  The patient's history has been reviewed, patient examined, no change in status, stable for surgery.  I have reviewed the patient's chart and labs.  Questions were answered to the patient's satisfaction.     Darene Lamer Erin Uecker

## 2018-10-22 NOTE — Op Note (Signed)
Preoperative Diagnosis: DUCTAL CARCINOMA IN SITU RIGHT BREAST  Postoprative Diagnosis: DUCTAL CARCINOMA IN SITU RIGHT BREAST  Procedure: Procedure(s): BREAST LUMPECTOMY WITH RADIOACTIVE SEED LOCALIZATION   Surgeon: Excell Seltzer T   Assistants: None  Anesthesia:  General LMA anesthesia  Indications: 62 year old female with new diagnosis of high-grade DCIS upper outer quadrant right breast measuring 1.3 cm a stone calcifications.  After preoperative work-up and discussion detailed elsewhere we have elected to proceed with radioactive seed localized right breast lumpectomy as initial surgical therapy.    Procedure Detail: Patient had previously undergone accurate placement of a radioactive seed at the area of biopsy clip and calcifications in the upper outer right breast.  She was brought to the operating room, placed in supine position on the operating table, and general LMA anesthesia induced.  She received preoperative IV antibiotics.  The right breast was widely sterilely prepped and draped.  Patient timeout was performed and correct procedure verified. The seed was localized with the neoprobe in the upper outer right breast about 2 cm from the areolar border.  I used a circumareolar incision and dissection carried down into the sub-cutaneous tissue.  A skin is some cutaneous flap was raised superiorly and laterally over the area of high counts.  Using the neoprobe for guidance I removed an approximately 2-1/2 cm globular specimen of breast tissue around the seed.  Breast tissue was noted to be very dense.  The specimen was inked for margins.  Specimen x-ray showed the seed and marking clip within the specimen although somewhat closer to the superior medial margin.  I then further excised an approximately 1/2 cm superior medial margin which was also inked and sent separately.  Complete hemostasis was obtained in the lumpectomy cavity.  Soft tissue was infiltrated with Marcaine.  The  lumpectomy cavity was marked with clips.  The deep breast and subtenons tissue was closed with interrupted 3-0 Vicryl and skin with running some particular 5-0 Monocryl and Dermabond.  Sponge needle and instrument counts were correct.    Findings: As above  Estimated Blood Loss:  Minimal         Drains: None  Blood Given: none          Specimens: #1 right breast lumpectomy   #2 further superior medial margin        Complications:  * No complications entered in OR log *         Disposition: PACU - hemodynamically stable.         Condition: stable

## 2018-10-22 NOTE — Transfer of Care (Signed)
Immediate Anesthesia Transfer of Care Note  Patient: Tracie Hart  Procedure(s) Performed: BREAST LUMPECTOMY WITH RADIOACTIVE SEED LOCALIZATION (Right Breast)  Patient Location: PACU  Anesthesia Type:General  Level of Consciousness: sedated  Airway & Oxygen Therapy: Patient Spontanous Breathing and Patient connected to face mask oxygen  Post-op Assessment: Report given to RN and Post -op Vital signs reviewed and stable  Post vital signs: Reviewed and stable  Last Vitals:  Vitals Value Taken Time  BP    Temp    Pulse    Resp    SpO2      Last Pain:  Vitals:   10/22/18 0639  TempSrc: Oral  PainSc: 3       Patients Stated Pain Goal: 3 ([id number])  Complications: No apparent anesthesia complications

## 2018-10-22 NOTE — Anesthesia Postprocedure Evaluation (Signed)
Anesthesia Post Note  Patient: Tracie Hart  Procedure(s) Performed: BREAST LUMPECTOMY WITH RADIOACTIVE SEED LOCALIZATION (Right Breast)     Patient location during evaluation: PACU Anesthesia Type: General Level of consciousness: awake and alert Pain management: pain level controlled Vital Signs Assessment: post-procedure vital signs reviewed and stable Respiratory status: spontaneous breathing, nonlabored ventilation, respiratory function stable and patient connected to nasal cannula oxygen Cardiovascular status: blood pressure returned to baseline and stable Postop Assessment: no apparent nausea or vomiting Anesthetic complications: no    Last Vitals:  Vitals:   10/22/18 0930 10/22/18 0937  BP:  140/82  Pulse: 90 91  Resp:  18  Temp:  36.7 C  SpO2: 95% 98%    Last Pain:  Vitals:   10/22/18 0937  TempSrc:   PainSc: 5                  Kin Galbraith

## 2018-10-22 NOTE — Anesthesia Procedure Notes (Signed)
Procedure Name: LMA Insertion Date/Time: 10/22/2018 7:40 AM Performed by: Lieutenant Diego, CRNA Pre-anesthesia Checklist: Patient identified, Emergency Drugs available, Suction available and Patient being monitored Patient Re-evaluated:Patient Re-evaluated prior to induction Oxygen Delivery Method: Circle system utilized Preoxygenation: Pre-oxygenation with 100% oxygen Induction Type: IV induction Ventilation: Mask ventilation without difficulty LMA: LMA inserted LMA Size: 4.0 Number of attempts: 1 Airway Equipment and Method: Bite block Placement Confirmation: positive ETCO2 and breath sounds checked- equal and bilateral Tube secured with: Tape Dental Injury: Teeth and Oropharynx as per pre-operative assessment

## 2018-10-23 ENCOUNTER — Encounter (HOSPITAL_BASED_OUTPATIENT_CLINIC_OR_DEPARTMENT_OTHER): Payer: Self-pay | Admitting: General Surgery

## 2018-10-26 DIAGNOSIS — G4733 Obstructive sleep apnea (adult) (pediatric): Secondary | ICD-10-CM | POA: Diagnosis not present

## 2018-10-28 DIAGNOSIS — G518 Other disorders of facial nerve: Secondary | ICD-10-CM | POA: Diagnosis not present

## 2018-10-28 DIAGNOSIS — M791 Myalgia, unspecified site: Secondary | ICD-10-CM | POA: Diagnosis not present

## 2018-10-28 DIAGNOSIS — M542 Cervicalgia: Secondary | ICD-10-CM | POA: Diagnosis not present

## 2018-10-28 DIAGNOSIS — G43719 Chronic migraine without aura, intractable, without status migrainosus: Secondary | ICD-10-CM | POA: Diagnosis not present

## 2018-10-31 ENCOUNTER — Other Ambulatory Visit: Payer: Self-pay | Admitting: Internal Medicine

## 2018-11-01 NOTE — Patient Instructions (Signed)
Tests ordered today. Your results will be released to [facility] (or called to you) after review, usually within 72hours after test completion. If any changes need to be made, you will be notified at that same time.  All other Health Maintenance issues reviewed.   All recommended immunizations and age-appropriate screenings are up-to-date or discussed.  No immunizations administered today.   Medications reviewed and updated.  Changes include :     Your prescription(s) have been submitted to your pharmacy. Please take as directed and contact our office if you believe you are having problem(s) with the medication(s).  A referral was ordered for   Please followup in    Health Maintenance, Female Adopting a healthy lifestyle and getting preventive care can go a long way to promote health and wellness. Talk with your health care provider about what schedule of regular examinations is right for you. This is a good chance for you to check in with your provider about disease prevention and staying healthy. In between checkups, there are plenty of things you can do on your own. Experts have done a lot of research about which lifestyle changes and preventive measures are most likely to keep you healthy. Ask your health care provider for more information. Weight and diet Eat a healthy diet  Be sure to include plenty of vegetables, fruits, low-fat dairy products, and lean protein.  Do not eat a lot of foods high in solid fats, added sugars, or salt.  Get regular exercise. This is one of the most important things you can do for your health. ? Most adults should exercise for at least 150 minutes each week. The exercise should increase your heart rate and make you sweat (moderate-intensity exercise). ? Most adults should also do strengthening exercises at least twice a week. This is in addition to the moderate-intensity exercise.  Maintain a healthy weight  Body mass index (BMI) is a measurement that  can be used to identify possible weight problems. It estimates body fat based on height and weight. Your health care provider can help determine your BMI and help you achieve or maintain a healthy weight.  For females 50 years of age and older: ? A BMI below 18.5 is considered underweight. ? A BMI of 18.5 to 24.9 is normal. ? A BMI of 25 to 29.9 is considered overweight. ? A BMI of 30 and above is considered obese.  Watch levels of cholesterol and blood lipids  You should start having your blood tested for lipids and cholesterol at 62 years of age, then have this test every 5 years.  You may need to have your cholesterol levels checked more often if: ? Your lipid or cholesterol levels are high. ? You are older than 62 years of age. ? You are at high risk for heart disease.  Cancer screening Lung Cancer  Lung cancer screening is recommended for adults 4-43 years old who are at high risk for lung cancer because of a history of smoking.  A yearly low-dose CT scan of the lungs is recommended for people who: ? Currently smoke. ? Have quit within the past 15 years. ? Have at least a 30-pack-year history of smoking. A pack year is smoking an average of one pack of cigarettes a day for 1 year.  Yearly screening should continue until it has been 15 years since you quit.  Yearly screening should stop if you develop a health problem that would prevent you from having lung cancer treatment.  Breast  Cancer  Practice breast self-awareness. This means understanding how your breasts normally appear and feel.  It also means doing regular breast self-exams. Let your health care provider know about any changes, no matter how small.  If you are in your 20s or 30s, you should have a clinical breast exam (CBE) by a health care provider every 1-3 years as part of a regular health exam.  If you are 40 or older, have a CBE every year. Also consider having a breast X-ray (mammogram) every year.  If  you have a family history of breast cancer, talk to your health care provider about genetic screening.  If you are at high risk for breast cancer, talk to your health care provider about having an MRI and a mammogram every year.  Breast cancer gene (BRCA) assessment is recommended for women who have family members with BRCA-related cancers. BRCA-related cancers include: ? Breast. ? Ovarian. ? Tubal. ? Peritoneal cancers.  Results of the assessment will determine the need for genetic counseling and BRCA1 and BRCA2 testing.  Cervical Cancer Your health care provider may recommend that you be screened regularly for cancer of the pelvic organs (ovaries, uterus, and vagina). This screening involves a pelvic examination, including checking for microscopic changes to the surface of your cervix (Pap test). You may be encouraged to have this screening done every 3 years, beginning at age 21.  For women ages 30-65, health care providers may recommend pelvic exams and Pap testing every 3 years, or they may recommend the Pap and pelvic exam, combined with testing for human papilloma virus (HPV), every 5 years. Some types of HPV increase your risk of cervical cancer. Testing for HPV may also be done on women of any age with unclear Pap test results.  Other health care providers may not recommend any screening for nonpregnant women who are considered low risk for pelvic cancer and who do not have symptoms. Ask your health care provider if a screening pelvic exam is right for you.  If you have had past treatment for cervical cancer or a condition that could lead to cancer, you need Pap tests and screening for cancer for at least 20 years after your treatment. If Pap tests have been discontinued, your risk factors (such as having a new sexual partner) need to be reassessed to determine if screening should resume. Some women have medical problems that increase the chance of getting cervical cancer. In these cases,  your health care provider may recommend more frequent screening and Pap tests.  Colorectal Cancer  This type of cancer can be detected and often prevented.  Routine colorectal cancer screening usually begins at 62 years of age and continues through 62 years of age.  Your health care provider may recommend screening at an earlier age if you have risk factors for colon cancer.  Your health care provider may also recommend using home test kits to check for hidden blood in the stool.  A small camera at the end of a tube can be used to examine your colon directly (sigmoidoscopy or colonoscopy). This is done to check for the earliest forms of colorectal cancer.  Routine screening usually begins at age 50.  Direct examination of the colon should be repeated every 5-10 years through 62 years of age. However, you may need to be screened more often if early forms of precancerous polyps or small growths are found.  Skin Cancer  Check your skin from head to toe regularly.  Tell your   health care provider about any new moles or changes in moles, especially if there is a change in a mole's shape or color.  Also tell your health care provider if you have a mole that is larger than the size of a pencil eraser.  Always use sunscreen. Apply sunscreen liberally and repeatedly throughout the day.  Protect yourself by wearing long sleeves, pants, a wide-brimmed hat, and sunglasses whenever you are outside.  Heart disease, diabetes, and high blood pressure  High blood pressure causes heart disease and increases the risk of stroke. High blood pressure is more likely to develop in: ? People who have blood pressure in the high end of the normal range (130-139/85-89 mm Hg). ? People who are overweight or obese. ? People who are African American.  If you are 18-43 years of age, have your blood pressure checked every 3-5 years. If you are 31 years of age or older, have your blood pressure checked every year.  You should have your blood pressure measured twice-once when you are at a hospital or clinic, and once when you are not at a hospital or clinic. Record the average of the two measurements. To check your blood pressure when you are not at a hospital or clinic, you can use: ? An automated blood pressure machine at a pharmacy. ? A home blood pressure monitor.  If you are between 72 years and 17 years old, ask your health care provider if you should take aspirin to prevent strokes.  Have regular diabetes screenings. This involves taking a blood sample to check your fasting blood sugar level. ? If you are at a normal weight and have a low risk for diabetes, have this test once every three years after 62 years of age. ? If you are overweight and have a high risk for diabetes, consider being tested at a younger age or more often. Preventing infection Hepatitis B  If you have a higher risk for hepatitis B, you should be screened for this virus. You are considered at high risk for hepatitis B if: ? You were born in a country where hepatitis B is common. Ask your health care provider which countries are considered high risk. ? Your parents were born in a high-risk country, and you have not been immunized against hepatitis B (hepatitis B vaccine). ? You have HIV or AIDS. ? You use needles to inject street drugs. ? You live with someone who has hepatitis B. ? You have had sex with someone who has hepatitis B. ? You get hemodialysis treatment. ? You take certain medicines for conditions, including cancer, organ transplantation, and autoimmune conditions.  Hepatitis C  Blood testing is recommended for: ? Everyone born from [birth year] through 1965. ? Anyone with known risk factors for hepatitis C.  Sexually transmitted infections (STIs)  You should be screened for sexually transmitted infections (STIs) including gonorrhea and chlamydia if: ? You are sexually active and are younger than 62 years of  age. ? You are older than 62 years of age and your health care provider tells you that you are at risk for this type of infection. ? Your sexual activity has changed since you were last screened and you are at an increased risk for chlamydia or gonorrhea. Ask your health care provider if you are at risk.  If you do not have HIV, but are at risk, it may be recommended that you take a prescription medicine daily to prevent HIV infection. This is called pre-exposure prophylaxis (  PrEP). You are considered at risk if: ? You are sexually active and do not regularly use condoms or know the HIV status of your partner(s). ? You take drugs by injection. ? You are sexually active with a partner who has HIV.  Talk with your health care provider about whether you are at high risk of being infected with HIV. If you choose to begin PrEP, you should first be tested for HIV. You should then be tested every 3 months for as long as you are taking PrEP. Pregnancy  If you are premenopausal and you may become pregnant, ask your health care provider about preconception counseling.  If you may become pregnant, take 400 to 800 micrograms (mcg) of folic acid every day.  If you want to prevent pregnancy, talk to your health care provider about birth control (contraception). Osteoporosis and menopause  Osteoporosis is a disease in which the bones lose minerals and strength with aging. This can result in serious bone fractures. Your risk for osteoporosis can be identified using a bone density scan.  If you are 103 years of age or older, or if you are at risk for osteoporosis and fractures, ask your health care provider if you should be screened.  Ask your health care provider whether you should take a calcium or vitamin D supplement to lower your risk for osteoporosis.  Menopause may have certain physical symptoms and risks.  Hormone replacement therapy may reduce some of these symptoms and risks. Talk to your health  care provider about whether hormone replacement therapy is right for you. Follow these instructions at home:  Schedule regular health, dental, and eye exams.  Stay current with your immunizations.  Do not use any tobacco products including cigarettes, chewing tobacco, or electronic cigarettes.  If you are pregnant, do not drink alcohol.  If you are breastfeeding, limit how much and how often you drink alcohol.  Limit alcohol intake to no more than 1 drink per day for nonpregnant women. One drink equals 12 ounces of beer, 5 ounces of wine, or 1 ounces of hard liquor.  Do not use street drugs.  Do not share needles.  Ask your health care provider for help if you need support or information about quitting drugs.  Tell your health care provider if you often feel depressed.  Tell your health care provider if you have ever been abused or do not feel safe at home. This information is not intended to replace advice given to you by your health care provider. Make sure you discuss any questions you have with your health care provider. Document Released: 05/20/2011 Document Revised: 04/11/2016 Document Reviewed: 08/08/2015 Elsevier Interactive Patient Education  [employer].

## 2018-11-01 NOTE — Progress Notes (Signed)
Subjective:    Patient ID: Tracie Hart, female    DOB: [birth year], 62 y.o.   MRN: [id number]  HPI She is here for a physical exam.     Medications and allergies reviewed with patient and updated if appropriate.  Patient Active Problem List   Diagnosis Date Noted  . Ductal carcinoma in situ (DCIS) of right breast 10/08/2018  . Osteopenia 09/08/2018  . Non-intractable vomiting with nausea 07/28/2018  . Loose stools 07/28/2018  . Fatigue 05/08/2018  . Dizziness 03/17/2018  . Pain of upper abdomen 03/17/2018  . Chronic pain of left knee 03/17/2018  . Multiple lipomas 01/05/2018  . Hypothyroidism 11/03/2017  . Depression 11/03/2017  . Family history of diabetes mellitus (DM) 11/03/2017  . Migraines 11/03/2017  . GERD (gastroesophageal reflux disease) 11/03/2017  . OSA (obstructive sleep apnea) 11/03/2017  . Hyperlipidemia 11/03/2017  . Overactive bladder 11/03/2017    Current Outpatient Medications on File Prior to Visit  Medication Sig Dispense Refill  . calcium-vitamin D (OSCAL WITH D) 500-200 MG-UNIT tablet Take 1 tablet by mouth.    . COLLAGEN PO Take by mouth.    . escitalopram (LEXAPRO) 10 MG tablet TAKE 1 TABLET(10 MG) BY MOUTH DAILY 90 tablet 0  . escitalopram (LEXAPRO) 20 MG tablet TAKE 1 TABLET(20 MG) BY MOUTH DAILY 90 tablet 0  . lansoprazole (PREVACID) 30 MG capsule TAKE ONE CAPSULE BY MOUTH DAILY AT NOON 90 capsule 0  . levothyroxine (SYNTHROID, LEVOTHROID) 50 MCG tablet TAKE 1 TABLET(50 MCG) BY MOUTH DAILY BEFORE BREAKFAST 90 tablet 0  . Magnesium 400 MG TABS Take 200 mg by mouth daily.     Marland Kitchen oxyCODONE (OXY IR/ROXICODONE) 5 MG immediate release tablet Take 1 tablet (5 mg total) by mouth every 6 (six) hours as needed for moderate pain, severe pain or breakthrough pain. 10 tablet 0  . SUMAtriptan (IMITREX) 100 MG tablet Take 1 tablet (100 mg total) by mouth every 2 (two) hours as needed for migraine. May repeat in 2 hours if headache persists or recurs.  10 tablet 0  . vitamin B-12 (CYANOCOBALAMIN) 500 MCG tablet Take 1,000 mcg by mouth daily.      No current facility-administered medications on file prior to visit.     Past Medical History:  Diagnosis Date  . Anemia   . Anxiety   . Depression   . GERD (gastroesophageal reflux disease)   . Migraine   . Migraine   . Osteopenia   . Sleep apnea with use of continuous positive airway pressure (CPAP)   . Thyroid disease     Past Surgical History:  Procedure Laterality Date  . BREAST LUMPECTOMY WITH RADIOACTIVE SEED LOCALIZATION Right 10/22/2018   Procedure: BREAST LUMPECTOMY WITH RADIOACTIVE SEED LOCALIZATION;  Surgeon: Excell Seltzer, MD;  Location: [facility];  Service: General;  Laterality: Right;  . GALLBLADDER SURGERY    . KNEE ARTHROSCOPY Left   . NEUROFIBROMA EXCISION  1986  . PALATE / UVULA BIOPSY / EXCISION      Social History   Socioeconomic History  . Marital status: Legally Separated    Spouse name: Not on file  . Number of children: 1  . Years of education: Not on file  . Highest education level: Not on file  Occupational History  . Occupation: retired  Scientific laboratory technician  . Financial resource strain: Not on file  . Food insecurity:    Worry: Not on file    Inability: Not on file  .  Transportation needs:    Medical: Not on file    Non-medical: Not on file  Tobacco Use  . Smoking status: Never Smoker  . Smokeless tobacco: Never Used  Substance and Sexual Activity  . Alcohol use: Yes    Comment: rarely  . Drug use: No  . Sexual activity: Never  Lifestyle  . Physical activity:    Days per week: Not on file    Minutes per session: Not on file  . Stress: Not on file  Relationships  . Social connections:    Talks on phone: Not on file    Gets together: Not on file    Attends religious service: Not on file    Active member of club or organization: Not on file    Attends meetings of clubs or organizations: Not on file    Relationship  status: Not on file  Other Topics Concern  . Not on file  Social History Narrative   No regular exercise    Family History  Problem Relation Age of Onset  . Arthritis Mother   . Diabetes Mother   . Heart disease Mother   . Hypertension Mother   . Stroke Mother   . Arthritis Father   . Depression Father   . Diabetes Father   . Hyperlipidemia Father   . Hypertension Father   . Depression Brother   . Hypertension Brother   . Heart disease Maternal Grandmother   . COPD Paternal Grandmother   . COPD Paternal Grandfather   . Colon cancer Paternal Grandfather     Review of Systems     Objective:  There were no vitals filed for this visit. There were no vitals filed for this visit. There is no height or weight on file to calculate BMI.  BP Readings from Last 3 Encounters:  10/22/18 140/82  10/12/18 (!) 156/29  10/08/18 (!) 172/97    Wt Readings from Last 3 Encounters:  10/22/18 145 lb 11.6 oz (66.1 kg)  10/12/18 146 lb 9.6 oz (66.5 kg)  10/08/18 147 lb 6.4 oz (66.9 kg)     Physical Exam       Assessment & Plan:       This encounter was created in error - please disregard.

## 2018-11-02 DIAGNOSIS — M5416 Radiculopathy, lumbar region: Secondary | ICD-10-CM | POA: Diagnosis not present

## 2018-11-04 ENCOUNTER — Encounter: Payer: Self-pay | Admitting: Internal Medicine

## 2018-11-04 NOTE — Assessment & Plan Note (Signed)
09/21/2018 palpable right breast mass at 12:30 position 2.4 x 0.8 x 3 cm lipoma; screening detected right breast calcifications 1.3 cm linear orientation right breast upper outer quadrant; biopsy: DCIS high-grade, comedo pattern ER 10%, PR 0%, Tis NX stage 0  10/22/18: Rt Lumpectomy: DCIS IG to HG ER 10% PR 0%, TisNx Stage 0  Plan: 1. adjuvant radiation therapy 2. Followed by antiestrogen therapy with tamoxifen 5 years

## 2018-11-04 NOTE — Progress Notes (Signed)
Patient Care Team: Binnie Rail, MD as PCP - General (Internal Medicine)  DIAGNOSIS:    ICD-10-CM   1. Ductal carcinoma in situ (DCIS) of right breast D05.11     SUMMARY OF ONCOLOGIC HISTORY:   Ductal carcinoma in situ (DCIS) of right breast   09/21/2018 Initial Diagnosis    Palpable right breast mass at 12:30 position 2.4 x 0.8 x 3 cm lipoma; screening detected right breast calcifications 1.3 cm linear orientation right breast upper outer quadrant; biopsy: DCIS high-grade, comedo pattern ER 10%, PR 0%, Tis NX stage 0    10/22/2018 Surgery    Rt Lumpectomy: DCIS IG to HG ER 10% PR 0%, TisNx Stage 0     CHIEF COMPLIANT: Follow-up of right breast DCIS s/p lumpectomy  INTERVAL HISTORY: Tracie Hart is a 62 y.o. with above-mentioned history of right breast DCIS. She had a lumpectomy on 10/22/18 which confirmed high grade DCIS, ER 10%, PR negative. She presents to the clinic today prior to beginning radiation and notes surgery went well. She expressed concern about the blood clot side effect of Tamoxifen because her mother died of a stroke and was worried about hot flashes and myalgias. She has a history of osteopenia, with her most recent bone density scan on 09/07/18 showing a T-score of -1.1. She expressed desire to get a flu shot.   REVIEW OF SYSTEMS:   Constitutional: Denies fevers, chills or abnormal weight loss Eyes: Denies blurriness of vision Ears, nose, mouth, throat, and face: Denies mucositis or sore throat Respiratory: Denies cough, dyspnea or wheezes Cardiovascular: Denies palpitation, chest discomfort Gastrointestinal:  Denies nausea, heartburn or change in bowel habits Skin: Denies abnormal skin rashes Lymphatics: Denies new lymphadenopathy or easy bruising Neurological:Denies numbness, tingling or new weaknesses Behavioral/Psych: Mood is stable, no new changes  Extremities: No lower extremity edema Breast: denies any pain or lumps or nodules in either  breasts All other systems were reviewed with the patient and are negative.  I have reviewed the past medical history, past surgical history, social history and family history with the patient and they are unchanged from previous note.  ALLERGIES:  is allergic to codeine.  MEDICATIONS:  Current Outpatient Medications  Medication Sig Dispense Refill  . calcium-vitamin D (OSCAL WITH D) 500-200 MG-UNIT tablet Take 1 tablet by mouth.    . COLLAGEN PO Take by mouth.    . escitalopram (LEXAPRO) 10 MG tablet TAKE 1 TABLET(10 MG) BY MOUTH DAILY 90 tablet 0  . escitalopram (LEXAPRO) 20 MG tablet TAKE 1 TABLET(20 MG) BY MOUTH DAILY 90 tablet 0  . lansoprazole (PREVACID) 30 MG capsule TAKE ONE CAPSULE BY MOUTH DAILY AT NOON 90 capsule 0  . levothyroxine (SYNTHROID, LEVOTHROID) 50 MCG tablet TAKE 1 TABLET(50 MCG) BY MOUTH DAILY BEFORE BREAKFAST 90 tablet 0  . Magnesium 400 MG TABS Take 200 mg by mouth daily.     Marland Kitchen oxyCODONE (OXY IR/ROXICODONE) 5 MG immediate release tablet Take 1 tablet (5 mg total) by mouth every 6 (six) hours as needed for moderate pain, severe pain or breakthrough pain. 10 tablet 0  . SUMAtriptan (IMITREX) 100 MG tablet Take 1 tablet (100 mg total) by mouth every 2 (two) hours as needed for migraine. May repeat in 2 hours if headache persists or recurs. 10 tablet 0  . vitamin B-12 (CYANOCOBALAMIN) 500 MCG tablet Take 1,000 mcg by mouth daily.      No current facility-administered medications for this visit.     PHYSICAL EXAMINATION:  ECOG PERFORMANCE STATUS: 1 - Symptomatic but completely ambulatory  Vitals:   11/05/18 0940  BP: (!) 152/96  Pulse: 75  Resp: 17  Temp: 99 F (37.2 C)  SpO2: 99%   Filed Weights   11/05/18 0940  Weight: 147 lb 11.2 oz (67 kg)    GENERAL:alert, no distress and comfortable SKIN: skin color, texture, turgor are normal, no rashes or significant lesions EYES: normal, Conjunctiva are pink and non-injected, sclera clear OROPHARYNX:no exudate,  no erythema and lips, buccal mucosa, and tongue normal  NECK: supple, thyroid normal size, non-tender, without nodularity LYMPH:  no palpable lymphadenopathy in the cervical, axillary or inguinal LUNGS: clear to auscultation and percussion with normal breathing effort HEART: regular rate & rhythm and no murmurs and no lower extremity edema ABDOMEN:abdomen soft, non-tender and normal bowel sounds MUSCULOSKELETAL:no cyanosis of digits and no clubbing  NEURO: alert & oriented x 3 with fluent speech, no focal motor/sensory deficits EXTREMITIES: No lower extremity edema  LABORATORY DATA:  I have reviewed the data as listed CMP Latest Ref Rng & Units 03/17/2018  Glucose 70 - 99 mg/dL 91  BUN 6 - 23 mg/dL 19  Creatinine 0.40 - 1.20 mg/dL 0.75  Sodium 135 - 145 mEq/L 140  Potassium 3.5 - 5.1 mEq/L 3.9  Chloride 96 - 112 mEq/L 102  CO2 19 - 32 mEq/L 31  Calcium 8.4 - 10.5 mg/dL 9.2  Total Protein 6.0 - 8.3 g/dL 6.7  Total Bilirubin 0.2 - 1.2 mg/dL 0.6  Alkaline Phos 39 - 117 U/L 56  AST 0 - 37 U/L 14  ALT 0 - 35 U/L 13    Lab Results  Component Value Date   WBC 5.7 03/17/2018   HGB 14.5 03/17/2018   HCT 43.0 03/17/2018   MCV 82.7 03/17/2018   PLT 130.0 (L) 03/17/2018   NEUTROABS 3.9 03/17/2018    ASSESSMENT & PLAN:  Ductal carcinoma in situ (DCIS) of right breast 09/21/2018 palpable right breast mass at 12:30 position 2.4 x 0.8 x 3 cm lipoma; screening detected right breast calcifications 1.3 cm linear orientation right breast upper outer quadrant; biopsy: DCIS high-grade, comedo pattern ER 10%, PR 0%, Tis NX stage 0  10/22/18: Rt Lumpectomy: DCIS IG to HG ER 10% PR 0%, TisNx Stage 0  Plan: 1. adjuvant radiation therapy 2. Followed by antiestrogen therapy with tamoxifen 5 years We discussed the fact that she is only 10% ER positive and has a family history of stroke in her mother who died of stroke, we decided to start her on 5 mg of tamoxifen based on TAM-01 clinical trial which  showed benefit even at 5 mg doses.  We discussed the differences between tamoxifen and anastrozole and she felt that she would do better with tamoxifen at a low dose.  Return to clinic at the end of radiation therapy to begin tamoxifen.  No orders of the defined types were placed in this encounter.  The patient has a good understanding of the overall plan. she agrees with it. she will call with any problems that may develop before the next visit here.  Nicholas Lose, MD 11/05/2018   I, Cloyde Reams Dorshimer, am acting as scribe for Nicholas Lose, MD.  I have reviewed the above documentation for accuracy and completeness, and I agree with the above.

## 2018-11-05 ENCOUNTER — Telehealth: Payer: Self-pay | Admitting: Hematology and Oncology

## 2018-11-05 ENCOUNTER — Inpatient Hospital Stay: Payer: BLUE CROSS/BLUE SHIELD | Attending: Hematology and Oncology | Admitting: Hematology and Oncology

## 2018-11-05 VITALS — BP 152/96 | HR 75 | Temp 99.0°F | Resp 17 | Ht 64.0 in | Wt 147.7 lb

## 2018-11-05 DIAGNOSIS — Z923 Personal history of irradiation: Secondary | ICD-10-CM | POA: Diagnosis not present

## 2018-11-05 DIAGNOSIS — Z79899 Other long term (current) drug therapy: Secondary | ICD-10-CM | POA: Insufficient documentation

## 2018-11-05 DIAGNOSIS — Z23 Encounter for immunization: Secondary | ICD-10-CM | POA: Diagnosis not present

## 2018-11-05 DIAGNOSIS — D0511 Intraductal carcinoma in situ of right breast: Secondary | ICD-10-CM

## 2018-11-05 DIAGNOSIS — Z170 Estrogen receptor positive status [ER+]: Secondary | ICD-10-CM | POA: Diagnosis not present

## 2018-11-05 MED ORDER — INFLUENZA VAC SPLIT QUAD 0.5 ML IM SUSY
PREFILLED_SYRINGE | INTRAMUSCULAR | Status: AC
  Filled 2018-11-05: qty 0.5

## 2018-11-05 MED ORDER — INFLUENZA VAC SPLIT QUAD 0.5 ML IM SUSY
0.5000 mL | PREFILLED_SYRINGE | Freq: Once | INTRAMUSCULAR | Status: AC
  Administered 2018-11-05: 0.5 mL via INTRAMUSCULAR

## 2018-11-05 NOTE — Telephone Encounter (Signed)
No 12/19 los or referrals.

## 2018-11-05 NOTE — Patient Instructions (Signed)
Influenza (Flu) Vaccine (Inactivated or Recombinant): What You Need to Know  1. Why get vaccinated?  Influenza vaccine can prevent influenza (flu).  Flu is a contagious disease that spreads around the [place] every year, usually between October and May. Anyone can get the flu, but it is more dangerous for some people. Infants and young children, people 62 years of age and older, pregnant women, and people with certain health conditions or a weakened immune system are at greatest risk of flu complications.  Pneumonia, bronchitis, sinus infections and ear infections are examples of flu-related complications. If you have a medical condition, such as heart disease, cancer or diabetes, flu can make it worse.  Flu can cause fever and chills, sore throat, muscle aches, fatigue, cough, headache, and runny or stuffy nose. Some people may have vomiting and diarrhea, though this is more common in children than adults.  Each year thousands of people in the [place] die from flu, and many more are hospitalized. Flu vaccine prevents millions of illnesses and flu-related visits to the doctor each year.  2. Influenza vaccine  CDC recommends everyone 6 months of age and older get vaccinated every flu season. Children 6 months through 8 years of age may need 2 doses during a single flu season. Everyone else needs only 1 dose each flu season.  It takes about 2 weeks for protection to develop after vaccination.  There are many flu viruses, and they are always changing. Each year a new flu vaccine is made to protect against three or four viruses that are likely to cause disease in the upcoming flu season. Even when the vaccine doesn't exactly match these viruses, it may still provide some protection.  Influenza vaccine does not cause flu.  Influenza vaccine may be given at the same time as other vaccines.  3. Talk with your health care provider  Tell your vaccine provider if the person getting the vaccine:  · Has had an  allergic reaction after a previous dose of influenza vaccine, or has any severe, life-threatening allergies.  · Has ever had Guillain-Barré Syndrome (also called GBS).  In some cases, your health care provider may decide to postpone influenza vaccination to a future visit.  People with minor illnesses, such as a cold, may be vaccinated. People who are moderately or severely ill should usually wait until they recover before getting influenza vaccine.  Your health care provider can give you more information.  4. Risks of a vaccine reaction  · Soreness, redness, and swelling where shot is given, fever, muscle aches, and headache can happen after influenza vaccine.  · There may be a very small increased risk of Guillain-Barré Syndrome (GBS) after inactivated influenza vaccine (the flu shot).  Young children who get the flu shot along with pneumococcal vaccine (PCV13), and/or DTaP vaccine at the same time might be slightly more likely to have a seizure caused by fever. Tell your health care provider if a child who is getting flu vaccine has ever had a seizure.  People sometimes faint after medical procedures, including vaccination. Tell your provider if you feel dizzy or have vision changes or ringing in the ears.  As with any medicine, there is a very remote chance of a vaccine causing a severe allergic reaction, other serious injury, or death.  5. What if there is a serious problem?  An allergic reaction could occur after the vaccinated person leaves the clinic. If you see signs of a severe allergic reaction (hives, swelling   of the face and throat, difficulty breathing, a fast heartbeat, dizziness, or weakness), call 9-1-1 and get the person to the nearest hospital.  For other signs that concern you, call your health care provider.  Adverse reactions should be reported to the Vaccine Adverse Event Reporting System (VAERS). Your health care provider will usually file this report, or you can do it yourself. Visit the  VAERS website at www.vaers.hhs.gov or call 1-800-822-7967.VAERS is only for reporting reactions, and VAERS staff do not give medical advice.  6. The National Vaccine Injury Compensation Program  The National Vaccine Injury Compensation Program (VICP) is a federal program that was created to compensate people who may have been injured by certain vaccines. Visit the VICP website at www.hrsa.gov/vaccinecompensation or call 1-800-338-2382 to learn about the program and about filing a claim. There is a time limit to file a claim for compensation.  7. How can I learn more?  · Ask your healthcare provider.  · Call your local or state health department.  · Contact the Centers for Disease Control and Prevention (CDC):  ? Call 1-800-232-4636 (1-800-CDC-INFO) or  ? Visit CDC's www.cdc.gov/flu  Vaccine Information Statement (Interim) Inactivated Influenza Vaccine (07/02/2018)  This information is not intended to replace advice given to you by your health care provider. Make sure you discuss any questions you have with your health care provider.  Document Released: 08/29/2006 Document Revised: 07/06/2018 Document Reviewed: 07/06/2018  Elsevier Interactive Patient Education © 2019 [employer] Inc[employer]

## 2018-11-06 ENCOUNTER — Encounter: Payer: BLUE CROSS/BLUE SHIELD | Admitting: Internal Medicine

## 2018-11-10 NOTE — Progress Notes (Signed)
Location of Breast Cancer: Upper outer right breast  Histology per Pathology Report: 09/21/18: Diagnosis Breast, right, needle core biopsy, outer quadrant coil clip - DUCTAL CARCINOMA IN SITU - SEE COMMENT   PROGNOSTIC INDICATORS Results: IMMUNOHISTOCHEMICAL AND MORPHOMETRIC ANALYSIS PERFORMED MANUALLY Estrogen Receptor: 10%, POSITIVE, WEAK STAINING INTENSITY Progesterone Receptor: 0%, NEGATIVE  Diagnosis 1. Breast, lumpectomy, Right with seed - FOCI OF DUCTAL CARCINOMA IN SITU, INTERMEDIATE TO HIGH NUCLEAR GRADE. SEE NOTE. - NO EVIDENCE OF INVASIVE CARCINOMA. - DCIS IS 0.2 CM FROM THE SUPERIOR RESECTION EDGE (FINAL SUPERIOR MARGIN IS LIKELY REPRESENTED BY PART 2 WHICH IS NEGATIVE FOR DCIS). - DCIS 0.6 CM FROM THE INFERIOR MARGIN. - BACKGROUND BREAST TISSUE WITH FIBROCYSTIC CHANGE AND MICROCALCIFICATIONS. - BIOPSY SITE CHANGES. - SEE ONCOLOGY TABLE. 2. Breast, excision, Right additional superomedial margin - BENIGN BREAST PARENCHYMA, NEGATIVE FOR CARCINOMA  Receptor Status: ER(10%), PR (0%)     Did patient present with symptoms (if so, please note symptoms) or was this found on screening mammography?: screening mammogram  Past/Anticipated interventions by surgeon, if any: 10/22/18:  Procedure: Procedure(s): BREAST LUMPECTOMY WITH RADIOACTIVE SEED LOCALIZATION   Surgeon: Excell Seltzer T   Past/Anticipated interventions by medical oncology, if any: Chemotherapy 11/05/18 Per Dr. Lindi Adie:  ASSESSMENT & PLAN:  Ductal carcinoma in situ (DCIS) of right breast 09/21/2018 palpable right breast mass at 12:30 position 2.4 x 0.8 x 3 cm lipoma; screening detected right breast calcifications 1.3 cm linear orientation right breast upper outer quadrant; biopsy: DCIS high-grade, comedo pattern ER 10%, PR 0%, Tis NX stage 0  10/22/18: Rt Lumpectomy: DCIS IG to HG ER 10% PR 0%, TisNx Stage 0  Plan: 1. adjuvant radiation therapy 2. Followed by antiestrogen therapy with tamoxifen 5  years We discussed the fact that she is only 10% ER positive and has a family history of stroke in her mother who died of stroke, we decided to start her on 5 mg of tamoxifen based on TAM-01 clinical trial which showed benefit even at 5 mg doses.  We discussed the differences between tamoxifen and anastrozole and she felt that she would do better with tamoxifen at a low dose.  Return to clinic at the end of radiation therapy to begin tamoxifen.  Lymphedema issues, if any:  No  Pain issues, if any:  Pt denies pain but states breast is tender to touch.  SAFETY ISSUES:  Prior radiation? No  Pacemaker/ICD? No  Possible current pregnancy? No, pt is post-menopausal  Is the patient on methotrexate? No  Current Complaints / other details:  Pt presents today for follow up new appt with Dr. Sondra Come for Radiation Oncology. Pt is now post op and states breast is only tender to touch. Pt is unaccompanied.   BP 138/90 (BP Location: Left Arm, Patient Position: Sitting)   Pulse 82   Temp 99 F (37.2 C) (Oral)   Resp 20   Ht 5\' 4"  (1.626 m)   Wt 148 lb (67.1 kg)   SpO2 99%   BMI 25.40 kg/m   Wt Readings from Last 3 Encounters:  11/19/18 148 lb (67.1 kg)  11/05/18 147 lb 11.2 oz (67 kg)  10/22/18 145 lb 11.6 oz (66.1 kg)       Loma Sousa, RN 11/19/2018,2:07 PM

## 2018-11-18 DIAGNOSIS — G43719 Chronic migraine without aura, intractable, without status migrainosus: Secondary | ICD-10-CM | POA: Diagnosis not present

## 2018-11-18 DIAGNOSIS — G518 Other disorders of facial nerve: Secondary | ICD-10-CM | POA: Diagnosis not present

## 2018-11-18 DIAGNOSIS — M791 Myalgia, unspecified site: Secondary | ICD-10-CM | POA: Diagnosis not present

## 2018-11-18 DIAGNOSIS — M542 Cervicalgia: Secondary | ICD-10-CM | POA: Diagnosis not present

## 2018-11-19 ENCOUNTER — Ambulatory Visit
Admission: RE | Admit: 2018-11-19 | Discharge: 2018-11-19 | Disposition: A | Discharge status: Home / Self Care | Payer: BLUE CROSS/BLUE SHIELD | Source: Ambulatory Visit | Attending: Radiation Oncology | Admitting: Radiation Oncology

## 2018-11-19 ENCOUNTER — Other Ambulatory Visit: Payer: Self-pay

## 2018-11-19 ENCOUNTER — Encounter: Payer: Self-pay | Admitting: Radiation Oncology

## 2018-11-19 VITALS — BP 138/90 | HR 82 | Temp 99.0°F | Resp 20 | Ht 64.0 in | Wt 148.0 lb

## 2018-11-19 DIAGNOSIS — Z79899 Other long term (current) drug therapy: Secondary | ICD-10-CM | POA: Diagnosis not present

## 2018-11-19 DIAGNOSIS — C50411 Malignant neoplasm of upper-outer quadrant of right female breast: Secondary | ICD-10-CM | POA: Insufficient documentation

## 2018-11-19 DIAGNOSIS — D0511 Intraductal carcinoma in situ of right breast: Secondary | ICD-10-CM

## 2018-11-19 DIAGNOSIS — Z510 Encounter for antineoplastic radiation therapy: Secondary | ICD-10-CM | POA: Insufficient documentation

## 2018-11-19 DIAGNOSIS — Z170 Estrogen receptor positive status [ER+]: Secondary | ICD-10-CM | POA: Insufficient documentation

## 2018-11-19 DIAGNOSIS — Z79811 Long term (current) use of aromatase inhibitors: Secondary | ICD-10-CM | POA: Diagnosis not present

## 2018-11-19 DIAGNOSIS — Z9889 Other specified postprocedural states: Secondary | ICD-10-CM | POA: Diagnosis not present

## 2018-11-19 NOTE — Progress Notes (Signed)
Radiation Oncology         [phone] ________________________________  Name: Tracie Hart MRN: [id number]  Date: 11/19/2018  DOB: Aug 25, 1956  Re-Evaluation Visit Note  CC: Binnie Rail, MD  Excell Seltzer, MD    ICD-10-CM   1. Ductal carcinoma in situ (DCIS) of right breast D05.11     Diagnosis:   63 y.o. female with high grade DCIS of the right breast, pTis pNX  Narrative:  The patient returns today for re-evaluation of her right breast DCIS status post lumpectomy, dated 10/22/18.  Final pathology revealed a foci of ductal carcinoma in situ, intermediate to high grade, ER positive/PR negative. No evidence of invasive carcinoma. Final margins were negative. DCIS was 0.2 cm from the superior resection edge and 0.6 cm from the inferior margin.   Symptomatically, the patient denies any pain but states that her breast is tender with palpation. She denies any lymphedema or range of motion issues in her right arm/shoulder.                ALLERGIES:  is allergic to codeine.  Meds: Current Outpatient Medications  Medication Sig Dispense Refill  . calcium-vitamin D (OSCAL WITH D) 500-200 MG-UNIT tablet Take 1 tablet by mouth.    . COLLAGEN PO Take by mouth.    . escitalopram (LEXAPRO) 10 MG tablet TAKE 1 TABLET(10 MG) BY MOUTH DAILY 90 tablet 0  . escitalopram (LEXAPRO) 20 MG tablet TAKE 1 TABLET(20 MG) BY MOUTH DAILY 90 tablet 0  . lansoprazole (PREVACID) 30 MG capsule TAKE ONE CAPSULE BY MOUTH DAILY AT NOON 90 capsule 0  . levothyroxine (SYNTHROID, LEVOTHROID) 50 MCG tablet TAKE 1 TABLET(50 MCG) BY MOUTH DAILY BEFORE BREAKFAST 90 tablet 0  . Magnesium 400 MG TABS Take 200 mg by mouth daily.     . SUMAtriptan (IMITREX) 100 MG tablet Take 1 tablet (100 mg total) by mouth every 2 (two) hours as needed for migraine. May repeat in 2 hours if headache persists or recurs. 10 tablet 0  . vitamin B-12 (CYANOCOBALAMIN) 500 MCG tablet Take 1,000 mcg by mouth daily.     Marland Kitchen oxyCODONE  (OXY IR/ROXICODONE) 5 MG immediate release tablet Take 1 tablet (5 mg total) by mouth every 6 (six) hours as needed for moderate pain, severe pain or breakthrough pain. (Patient not taking: Reported on 11/19/2018) 10 tablet 0   No current facility-administered medications for this encounter.     Physical Findings: The patient is in no acute distress. Patient is alert and oriented.  height is '5\' 4"'$  (1.626 m) and weight is 148 lb (67.1 kg). Her oral temperature is 99 F (37.2 C). Her blood pressure is 138/90 and her pulse is 82. Her respiration is 20 and oxygen saturation is 99%.   Lungs are clear to auscultation bilaterally. Heart has regular rate and rhythm. No palpable cervical, supraclavicular, or axillary adenopathy. Abdomen soft, non-tender, normal bowel sounds.  Right Breast: Periareolar scar healing well without signs of drainage or infection. No dominant mass appreciated in the breast, nipple discharge or bleeding.  Lab Findings: Lab Results  Component Value Date   WBC 5.7 03/17/2018   HGB 14.5 03/17/2018   HCT 43.0 03/17/2018   MCV 82.7 03/17/2018   PLT 130.0 (L) 03/17/2018    Radiographic Findings: Mm Breast Surgical Specimen  Result Date: 10/22/2018 CLINICAL DATA:  Evaluate specimen EXAM: SPECIMEN RADIOGRAPH OF THE RIGHT BREAST COMPARISON:  Previous exam(s). FINDINGS: Status post excision of the right breast. The radioactive  seed and biopsy marker clip are present, completely intact, and were marked for pathology. IMPRESSION: Specimen radiograph of the right breast. Electronically Signed   By: Dorise Bullion III M.D   On: 10/22/2018 08:20   Mm Rt Radioactive Seed Loc Mammo Guide  Result Date: 10/21/2018 CLINICAL DATA:  63 year old patient with recent diagnosis ductal carcinoma in situ presents for radioactive seed localization prior to lumpectomy. The coil shaped biopsy clip placed at the time of stereotactic biopsy was in satisfactory position. EXAM: MAMMOGRAPHIC GUIDED  RADIOACTIVE SEED LOCALIZATION OF THE RIGHT BREAST COMPARISON:  Previous exam(s). FINDINGS: Patient presents for radioactive seed localization prior to lumpectomy. I met with the patient and we discussed the procedure of seed localization including benefits and alternatives. We discussed the high likelihood of a successful procedure. We discussed the risks of the procedure including infection, bleeding, tissue injury and further surgery. We discussed the low dose of radioactivity involved in the procedure. Informed, written consent was given. The usual time-out protocol was performed immediately prior to the procedure. Using mammographic guidance, sterile technique, 1% lidocaine and an I-125 radioactive seed, the coil shaped biopsy clip in the upper slightly outer right breast was localized using a lateral approach. The follow-up mammogram images confirm the seed in the expected location and were marked for Dr. Excell Seltzer. Follow-up survey of the patient confirms presence of the radioactive seed. Order number of I-125 seed:  [id number]. Total activity: 0.253 mCi reference Date: 09 October 2018 The patient tolerated the procedure well and was released from the [facility]. She was given instructions regarding seed removal. IMPRESSION: Radioactive seed localization right breast. No apparent complications. Electronically Signed   By: Curlene Dolphin M.D.   On: 10/21/2018 14:55    Impression:  High grade DCIS of the right breast, pTis pNX   Patient is a good candidate for 4 weeks of hypofractionated radiotherapy. Do not recommend lumpectomy alone given the high grade and her age.  Today, I talked to the patient about the findings and work-up thus far.  We discussed the natural history of breast DCIS and general treatment, highlighting the role of radiotherapy in the management.  We discussed the available radiation techniques, and focused on the details of logistics and delivery.  We reviewed the anticipated acute  and late sequelae associated with radiation in this setting.  The patient was encouraged to ask questions that I answered to the best of my ability.  A patient consent form was discussed and signed.  We retained a copy for our records.  The patient would like to proceed with radiation.  Plan:  Patient is scheduled for CT simulation this afternoon with treatment to begin in approximately one week. Anticipate 4 weeks of radiation therapy.  ____________________________________  Blair Promise, PhD, MD  This document serves as a record of services personally performed by Gery Pray, MD. It was created on his behalf by Rae Lips, a trained medical scribe. The creation of this record is based on the scribe's personal observations and the provider's statements to them. This document has been checked and approved by the attending provider.

## 2018-11-21 NOTE — Progress Notes (Signed)
  Radiation Oncology         [phone] ________________________________  Name: Tracie Hart MRN: [id number]  Date: 11/19/2018  DOB: [birth year]  SIMULATION AND TREATMENT PLANNING NOTE    ICD-10-CM   1. Ductal carcinoma in situ (DCIS) of right breast D05.11     DIAGNOSIS:  63 y.o. female with high gradeDCIS of the right breast, pTis pNX  NARRATIVE:  The patient was brought to the [facility].  Identity was confirmed.  All relevant records and images related to the planned course of therapy were reviewed.  The patient freely provided informed written consent to proceed with treatment after reviewing the details related to the planned course of therapy. The consent form was witnessed and verified by the simulation staff.  Then, the patient was set-up in a stable reproducible  supine position for radiation therapy.  CT images were obtained.  Surface markings were placed.  The CT images were loaded into the planning software.  Then the target and avoidance structures were contoured.  Treatment planning then occurred.  The radiation prescription was entered and confirmed.  Then, I designed and supervised the construction of a total of 3 medically necessary complex treatment devices.  I have requested : 3D Simulation  I have requested a DVH of the following structures: heart, lungs, lumpectomy cavity.  I have ordered:CBC  PLAN:  The patient will receive 40.05 Gy in 15 fractions followed by boost to the lumpectomy cavity of 10 gray in 5 fractions.   ________________________________   Rico Junker Tracking Plan:  Since intensity modulated radiotherapy (IMRT) and 3D conformal radiation treatment methods are predicated on accurate and precise positioning for treatment, intrafraction motion monitoring is medically necessary to ensure accurate and safe treatment delivery.  The ability to quantify intrafraction motion without excessive ionizing radiation dose can only be  performed with optical surface tracking. Accordingly, surface imaging offers the opportunity to obtain 3D measurements of patient position throughout IMRT and 3D treatments without excessive radiation exposure.  I am ordering optical surface tracking for this patient's upcoming course of radiotherapy. ________________________________   -----------------------------------  Blair Promise, PhD, MD

## 2018-11-24 ENCOUNTER — Encounter: Payer: Self-pay | Admitting: Internal Medicine

## 2018-11-24 DIAGNOSIS — Z510 Encounter for antineoplastic radiation therapy: Secondary | ICD-10-CM | POA: Diagnosis not present

## 2018-11-24 DIAGNOSIS — C50411 Malignant neoplasm of upper-outer quadrant of right female breast: Secondary | ICD-10-CM | POA: Diagnosis not present

## 2018-11-24 DIAGNOSIS — Z79899 Other long term (current) drug therapy: Secondary | ICD-10-CM | POA: Diagnosis not present

## 2018-11-24 DIAGNOSIS — Z170 Estrogen receptor positive status [ER+]: Secondary | ICD-10-CM | POA: Diagnosis not present

## 2018-11-24 DIAGNOSIS — D0511 Intraductal carcinoma in situ of right breast: Secondary | ICD-10-CM | POA: Diagnosis not present

## 2018-11-25 ENCOUNTER — Telehealth: Payer: Self-pay | Admitting: Hematology and Oncology

## 2018-11-25 NOTE — Telephone Encounter (Signed)
Left message re 2/5 f/u

## 2018-11-26 ENCOUNTER — Ambulatory Visit
Admission: RE | Admit: 2018-11-26 | Discharge: 2018-11-26 | Disposition: A | Discharge status: Home / Self Care | Payer: BLUE CROSS/BLUE SHIELD | Source: Ambulatory Visit | Attending: Radiation Oncology | Admitting: Radiation Oncology

## 2018-11-26 DIAGNOSIS — D0511 Intraductal carcinoma in situ of right breast: Secondary | ICD-10-CM

## 2018-11-26 DIAGNOSIS — Z79899 Other long term (current) drug therapy: Secondary | ICD-10-CM | POA: Diagnosis not present

## 2018-11-26 DIAGNOSIS — G4733 Obstructive sleep apnea (adult) (pediatric): Secondary | ICD-10-CM | POA: Diagnosis not present

## 2018-11-26 DIAGNOSIS — Z510 Encounter for antineoplastic radiation therapy: Secondary | ICD-10-CM | POA: Diagnosis not present

## 2018-11-26 DIAGNOSIS — C50411 Malignant neoplasm of upper-outer quadrant of right female breast: Secondary | ICD-10-CM | POA: Diagnosis not present

## 2018-11-26 DIAGNOSIS — Z170 Estrogen receptor positive status [ER+]: Secondary | ICD-10-CM | POA: Diagnosis not present

## 2018-11-26 NOTE — Progress Notes (Signed)
Subjective:    Patient ID: Tracie Hart, female    DOB: November 06, 1956, 63 y.o.   MRN: [id number]  HPI She is here for a physical exam.   Since she was here last she was diagnosed with breast cancer and had a lumpectomy and is just starting radiation therapy.  Overall she is doing well.  She was also having back pains last year and ended up having steroid injection and more the medication, which has helped.  She has minimal pain at this time.  As result of the steroids and being less active she has gained weight.  She is starting to walk again and wants to lose weight.  Depression: She is taking Lexapro 30 mg daily.  She intermittently decreases down to 20 mg, but it does not work as well as the 30 mg.  Ideally she would like to get off of it completely, but does not think that is possible.  She is seeing a therapist.  She wonders about genetic testing for the best antidepressant for her.  She is unsure if she wants to change medications.  Medications and allergies reviewed with patient and updated if appropriate.  Patient Active Problem List   Diagnosis Date Noted  . Ductal carcinoma in situ (DCIS) of right breast 10/08/2018  . Osteopenia 09/08/2018  . Non-intractable vomiting with nausea 07/28/2018  . Loose stools 07/28/2018  . Fatigue 05/08/2018  . Dizziness 03/17/2018  . Pain of upper abdomen 03/17/2018  . Chronic pain of left knee 03/17/2018  . Multiple lipomas 01/05/2018  . Hypothyroidism 11/03/2017  . Depression 11/03/2017  . Family history of diabetes mellitus (DM) 11/03/2017  . Migraines 11/03/2017  . GERD (gastroesophageal reflux disease) 11/03/2017  . OSA (obstructive sleep apnea) 11/03/2017  . Hyperlipidemia 11/03/2017  . Overactive bladder 11/03/2017    Current Outpatient Medications on File Prior to Visit  Medication Sig Dispense Refill  . calcium-vitamin D (OSCAL WITH D) 500-200 MG-UNIT tablet Take 1 tablet by mouth.    . COLLAGEN PO Take by mouth.    .  escitalopram (LEXAPRO) 10 MG tablet TAKE 1 TABLET(10 MG) BY MOUTH DAILY 90 tablet 0  . escitalopram (LEXAPRO) 20 MG tablet TAKE 1 TABLET(20 MG) BY MOUTH DAILY 90 tablet 0  . lansoprazole (PREVACID) 30 MG capsule TAKE ONE CAPSULE BY MOUTH DAILY AT NOON 90 capsule 0  . levothyroxine (SYNTHROID, LEVOTHROID) 50 MCG tablet TAKE 1 TABLET(50 MCG) BY MOUTH DAILY BEFORE BREAKFAST 90 tablet 0  . Magnesium 400 MG TABS Take 200 mg by mouth daily.     Marland Kitchen oxyCODONE (OXY IR/ROXICODONE) 5 MG immediate release tablet Take 1 tablet (5 mg total) by mouth every 6 (six) hours as needed for moderate pain, severe pain or breakthrough pain. 10 tablet 0  . SUMAtriptan (IMITREX) 100 MG tablet Take 1 tablet (100 mg total) by mouth every 2 (two) hours as needed for migraine. May repeat in 2 hours if headache persists or recurs. 10 tablet 0  . vitamin B-12 (CYANOCOBALAMIN) 500 MCG tablet Take 1,000 mcg by mouth daily.      No current facility-administered medications on file prior to visit.     Past Medical History:  Diagnosis Date  . Anemia   . Anxiety   . Depression   . GERD (gastroesophageal reflux disease)   . Migraine   . Migraine   . Osteopenia   . Sleep apnea with use of continuous positive airway pressure (CPAP)   . Thyroid disease  Past Surgical History:  Procedure Laterality Date  . BREAST LUMPECTOMY WITH RADIOACTIVE SEED LOCALIZATION Right 10/22/2018   Procedure: BREAST LUMPECTOMY WITH RADIOACTIVE SEED LOCALIZATION;  Surgeon: Excell Seltzer, MD;  Location: [facility];  Service: General;  Laterality: Right;  . GALLBLADDER SURGERY    . KNEE ARTHROSCOPY Left   . NEUROFIBROMA EXCISION  1986  . PALATE / UVULA BIOPSY / EXCISION      Social History   Socioeconomic History  . Marital status: Legally Separated    Spouse name: Not on file  . Number of children: 1  . Years of education: Not on file  . Highest education level: Not on file  Occupational History  . Occupation: retired   Scientific laboratory technician  . Financial resource strain: Not on file  . Food insecurity:    Worry: Not on file    Inability: Not on file  . Transportation needs:    Medical: Not on file    Non-medical: Not on file  Tobacco Use  . Smoking status: Never Smoker  . Smokeless tobacco: Never Used  Substance and Sexual Activity  . Alcohol use: Yes    Comment: rarely  . Drug use: No  . Sexual activity: Never  Lifestyle  . Physical activity:    Days per week: Not on file    Minutes per session: Not on file  . Stress: Not on file  Relationships  . Social connections:    Talks on phone: Not on file    Gets together: Not on file    Attends religious service: Not on file    Active member of club or organization: Not on file    Attends meetings of clubs or organizations: Not on file    Relationship status: Not on file  Other Topics Concern  . Not on file  Social History Narrative   No regular exercise    Family History  Problem Relation Age of Onset  . Arthritis Mother   . Diabetes Mother   . Heart disease Mother   . Hypertension Mother   . Stroke Mother   . Arthritis Father   . Depression Father   . Diabetes Father   . Hyperlipidemia Father   . Hypertension Father   . Depression Brother   . Hypertension Brother   . Heart disease Maternal Grandmother   . COPD Paternal Grandmother   . COPD Paternal Grandfather   . Colon cancer Paternal Grandfather     Review of Systems  Constitutional: Negative for chills, fatigue and fever.  Eyes: Negative for visual disturbance.  Respiratory: Negative for cough, shortness of breath and wheezing.   Cardiovascular: Positive for leg swelling (minimal). Negative for chest pain and palpitations.  Gastrointestinal: Negative for abdominal pain, blood in stool, constipation, diarrhea and nausea.       No gerd  Genitourinary: Negative for dysuria and hematuria.       Mild incontinence  Musculoskeletal: Positive for back pain.  Skin: Negative for color  change and rash.  Neurological: Positive for headaches (migraines). Negative for dizziness and light-headedness.  Psychiatric/Behavioral: Positive for dysphoric mood. The patient is nervous/anxious.        Objective:   Vitals:   11/27/18 1111  BP: (!) 146/92  Pulse: 74  Resp: 16  Temp: 98.4 F (36.9 C)  SpO2: 98%   Filed Weights   11/27/18 1111  Weight: 148 lb 12.8 oz (67.5 kg)   Body mass index is 25.54 kg/m.  BP Readings from Last  3 Encounters:  11/27/18 (!) 146/92  11/19/18 138/90  11/05/18 (!) 152/96    Wt Readings from Last 3 Encounters:  11/27/18 148 lb 12.8 oz (67.5 kg)  11/19/18 148 lb (67.1 kg)  11/05/18 147 lb 11.2 oz (67 kg)     Physical Exam Constitutional: She appears well-developed and well-nourished. No distress.  HENT:  Head: Normocephalic and atraumatic.  Right Ear: External ear normal. Normal ear canal and TM Left Ear: External ear normal.  Normal ear canal and TM Mouth/Throat: Oropharynx is clear and moist.  Eyes: Conjunctivae and EOM are normal.  Neck: Neck supple. No tracheal deviation present. No thyromegaly present.  No carotid bruit  Cardiovascular: Normal rate, regular rhythm and normal heart sounds.   No murmur heard.  No edema. Pulmonary/Chest: Effort normal and breath sounds normal. No respiratory distress. She has no wheezes. She has no rales.  Breast: deferred to Gyn Abdominal: Soft. She exhibits no distension. There is no tenderness.  Lymphadenopathy: She has no cervical adenopathy.  Skin: Skin is warm and dry. She is not diaphoretic.  Psychiatric: She has a normal mood and affect. Her behavior is normal.        Assessment & Plan:   Physical exam: Screening blood work   Reviewed  -  Scanned into chart Immunizations   ? tdap up to date-we will address at next visit  Colonoscopy  - will schedule after she finishes radiation Mammogram    Up to date  Gyn   Up to date  Dexa   Up to date  Eye exams    Up to date  EKG  None  on fiile Exercise   Regular - walking Weight  Normal BMI Skin  No concerns Substance abuse  none  See Problem List for Assessment and Plan of chronic medical problems.    Follow-up in 6 months

## 2018-11-26 NOTE — Progress Notes (Signed)
  Radiation Oncology         [phone] ________________________________  Name: Tracie Hart MRN: [id number]  Date: 11/26/2018  DOB: November 08, 1956  Simulation Verification Note    ICD-10-CM   1. Ductal carcinoma in situ (DCIS) of right breast D05.11     Status: outpatient  NARRATIVE: The patient was brought to the treatment unit and placed in the planned treatment position. The clinical setup was verified. Then port films were obtained and uploaded to the radiation oncology medical record software.  The treatment beams were carefully compared against the planned radiation fields. The position location and shape of the radiation fields was reviewed. They targeted volume of tissue appears to be appropriately covered by the radiation beams. Organs at risk appear to be excluded as planned.  Based on my personal review, I approved the simulation verification. The patient's treatment will proceed as planned.  -----------------------------------  Blair Promise, PhD, MD  This document serves as a record of services personally performed by Gery Pray, MD. It was created on his behalf by Rae Lips, a trained medical scribe. The creation of this record is based on the scribe's personal observations and the provider's statements to them. This document has been checked and approved by the attending provider.

## 2018-11-27 ENCOUNTER — Encounter: Payer: Self-pay | Admitting: Internal Medicine

## 2018-11-27 ENCOUNTER — Ambulatory Visit (INDEPENDENT_AMBULATORY_CARE_PROVIDER_SITE_OTHER): Payer: BLUE CROSS/BLUE SHIELD | Admitting: Internal Medicine

## 2018-11-27 ENCOUNTER — Ambulatory Visit
Admission: RE | Admit: 2018-11-27 | Discharge: 2018-11-27 | Disposition: A | Discharge status: Home / Self Care | Payer: BLUE CROSS/BLUE SHIELD | Source: Ambulatory Visit | Attending: Radiation Oncology | Admitting: Radiation Oncology

## 2018-11-27 DIAGNOSIS — D0511 Intraductal carcinoma in situ of right breast: Secondary | ICD-10-CM | POA: Diagnosis not present

## 2018-11-27 DIAGNOSIS — E038 Other specified hypothyroidism: Secondary | ICD-10-CM

## 2018-11-27 DIAGNOSIS — C50411 Malignant neoplasm of upper-outer quadrant of right female breast: Secondary | ICD-10-CM | POA: Diagnosis not present

## 2018-11-27 DIAGNOSIS — G4733 Obstructive sleep apnea (adult) (pediatric): Secondary | ICD-10-CM

## 2018-11-27 DIAGNOSIS — E7849 Other hyperlipidemia: Secondary | ICD-10-CM

## 2018-11-27 DIAGNOSIS — Z79899 Other long term (current) drug therapy: Secondary | ICD-10-CM | POA: Diagnosis not present

## 2018-11-27 DIAGNOSIS — Z170 Estrogen receptor positive status [ER+]: Secondary | ICD-10-CM | POA: Diagnosis not present

## 2018-11-27 DIAGNOSIS — Z510 Encounter for antineoplastic radiation therapy: Secondary | ICD-10-CM | POA: Diagnosis not present

## 2018-11-27 DIAGNOSIS — F3289 Other specified depressive episodes: Secondary | ICD-10-CM | POA: Diagnosis not present

## 2018-11-27 DIAGNOSIS — G43809 Other migraine, not intractable, without status migrainosus: Secondary | ICD-10-CM

## 2018-11-27 DIAGNOSIS — M8588 Other specified disorders of bone density and structure, other site: Secondary | ICD-10-CM

## 2018-11-27 MED ORDER — SUMATRIPTAN SUCCINATE 100 MG PO TABS: 100.0000 mg | ORAL_TABLET | ORAL | 8 refills | Status: DC | PRN

## 2018-11-27 NOTE — Patient Instructions (Addendum)
All other Health Maintenance issues reviewed.   All recommended immunizations and age-appropriate screenings are up-to-date or discussed.  No immunizations administered today.   Medications reviewed and updated.  Changes include :   none  Your prescription(s) have been submitted to your pharmacy. Please take as directed and contact our office if you believe you are having problem(s) with the medication(s).   Please followup in 6 months   Health Maintenance, Female Adopting a healthy lifestyle and getting preventive care can go a long way to promote health and wellness. Talk with your health care provider about what schedule of regular examinations is right for you. This is a good chance for you to check in with your provider about disease prevention and staying healthy. In between checkups, there are plenty of things you can do on your own. Experts have done a lot of research about which lifestyle changes and preventive measures are most likely to keep you healthy. Ask your health care provider for more information. Weight and diet Eat a healthy diet  Be sure to include plenty of vegetables, fruits, low-fat dairy products, and lean protein.  Do not eat a lot of foods high in solid fats, added sugars, or salt.  Get regular exercise. This is one of the most important things you can do for your health. ? Most adults should exercise for at least 150 minutes each week. The exercise should increase your heart rate and make you sweat (moderate-intensity exercise). ? Most adults should also do strengthening exercises at least twice a week. This is in addition to the moderate-intensity exercise. Maintain a healthy weight  Body mass index (BMI) is a measurement that can be used to identify possible weight problems. It estimates body fat based on height and weight. Your health care provider can help determine your BMI and help you achieve or maintain a healthy weight.  For females 63 years of age  and older: ? A BMI below 18.5 is considered underweight. ? A BMI of 18.5 to 24.9 is normal. ? A BMI of 25 to 29.9 is considered overweight. ? A BMI of 30 and above is considered obese. Watch levels of cholesterol and blood lipids  You should start having your blood tested for lipids and cholesterol at 63 years of age, then have this test every 5 years.  You may need to have your cholesterol levels checked more often if: ? Your lipid or cholesterol levels are high. ? You are older than 63 years of age. ? You are at high risk for heart disease. Cancer screening Lung Cancer  Lung cancer screening is recommended for adults 63-20 years old who are at high risk for lung cancer because of a history of smoking.  A yearly low-dose CT scan of the lungs is recommended for people who: ? Currently smoke. ? Have quit within the past 15 years. ? Have at least a 30-pack-year history of smoking. A pack year is smoking an average of one pack of cigarettes a day for 1 year.  Yearly screening should continue until it has been 15 years since you quit.  Yearly screening should stop if you develop a health problem that would prevent you from having lung cancer treatment. Breast Cancer  Practice breast self-awareness. This means understanding how your breasts normally appear and feel.  It also means doing regular breast self-exams. Let your health care provider know about any changes, no matter how small.  If you are in your 20s or 30s, you should  have a clinical breast exam (CBE) by a health care provider every 1-3 years as part of a regular health exam.  If you are 30 or older, have a CBE every year. Also consider having a breast X-ray (mammogram) every year.  If you have a family history of breast cancer, talk to your health care provider about genetic screening.  If you are at high risk for breast cancer, talk to your health care provider about having an MRI and a mammogram every year.  Breast  cancer gene (BRCA) assessment is recommended for women who have family members with BRCA-related cancers. BRCA-related cancers include: ? Breast. ? Ovarian. ? Tubal. ? Peritoneal cancers.  Results of the assessment will determine the need for genetic counseling and BRCA1 and BRCA2 testing. Cervical Cancer Your health care provider may recommend that you be screened regularly for cancer of the pelvic organs (ovaries, uterus, and vagina). This screening involves a pelvic examination, including checking for microscopic changes to the surface of your cervix (Pap test). You may be encouraged to have this screening done every 3 years, beginning at age 63.  For women ages 6-65, health care providers may recommend pelvic exams and Pap testing every 3 years, or they may recommend the Pap and pelvic exam, combined with testing for human papilloma virus (HPV), every 5 years. Some types of HPV increase your risk of cervical cancer. Testing for HPV may also be done on women of any age with unclear Pap test results.  Other health care providers may not recommend any screening for nonpregnant women who are considered low risk for pelvic cancer and who do not have symptoms. Ask your health care provider if a screening pelvic exam is right for you.  If you have had past treatment for cervical cancer or a condition that could lead to cancer, you need Pap tests and screening for cancer for at least 63 years after your treatment. If Pap tests have been discontinued, your risk factors (such as having a new sexual partner) need to be reassessed to determine if screening should resume. Some women have medical problems that increase the chance of getting cervical cancer. In these cases, your health care provider may recommend more frequent screening and Pap tests. Colorectal Cancer  This type of cancer can be detected and often prevented.  Routine colorectal cancer screening usually begins at 63 years of age and  continues through 63 years of age.  Your health care provider may recommend screening at an earlier age if you have risk factors for colon cancer.  Your health care provider may also recommend using home test kits to check for hidden blood in the stool.  A small camera at the end of a tube can be used to examine your colon directly (sigmoidoscopy or colonoscopy). This is done to check for the earliest forms of colorectal cancer.  Routine screening usually begins at age 70.  Direct examination of the colon should be repeated every 5-10 years through 63 years of age. However, you may need to be screened more often if early forms of precancerous polyps or small growths are found. Skin Cancer  Check your skin from head to toe regularly.  Tell your health care provider about any new moles or changes in moles, especially if there is a change in a mole's shape or color.  Also tell your health care provider if you have a mole that is larger than the size of a pencil eraser.  Always use sunscreen. Apply  sunscreen liberally and repeatedly throughout the day.  Protect yourself by wearing long sleeves, pants, a wide-brimmed hat, and sunglasses whenever you are outside. Heart disease, diabetes, and high blood pressure  High blood pressure causes heart disease and increases the risk of stroke. High blood pressure is more likely to develop in: ? People who have blood pressure in the high end of the normal range (130-139/85-89 mm Hg). ? People who are overweight or obese. ? People who are African American.  If you are 59-33 years of age, have your blood pressure checked every 3-5 years. If you are 35 years of age or older, have your blood pressure checked every year. You should have your blood pressure measured twice-once when you are at a hospital or clinic, and once when you are not at a hospital or clinic. Record the average of the two measurements. To check your blood pressure when you are not at a  hospital or clinic, you can use: ? An automated blood pressure machine at a pharmacy. ? A home blood pressure monitor.  If you are between 68 years and 48 years old, ask your health care provider if you should take aspirin to prevent strokes.  Have regular diabetes screenings. This involves taking a blood sample to check your fasting blood sugar level. ? If you are at a normal weight and have a low risk for diabetes, have this test once every three years after 63 years of age. ? If you are overweight and have a high risk for diabetes, consider being tested at a younger age or more often. Preventing infection Hepatitis B  If you have a higher risk for hepatitis B, you should be screened for this virus. You are considered at high risk for hepatitis B if: ? You were born in a country where hepatitis B is common. Ask your health care provider which countries are considered high risk. ? Your parents were born in a high-risk country, and you have not been immunized against hepatitis B (hepatitis B vaccine). ? You have HIV or AIDS. ? You use needles to inject street drugs. ? You live with someone who has hepatitis B. ? You have had sex with someone who has hepatitis B. ? You get hemodialysis treatment. ? You take certain medicines for conditions, including cancer, organ transplantation, and autoimmune conditions. Hepatitis C  Blood testing is recommended for: ? Everyone born from [birth year] through 1965. ? Anyone with known risk factors for hepatitis C. Sexually transmitted infections (STIs)  You should be screened for sexually transmitted infections (STIs) including gonorrhea and chlamydia if: ? You are sexually active and are younger than 63 years of age. ? You are older than 64 years of age and your health care provider tells you that you are at risk for this type of infection. ? Your sexual activity has changed since you were last screened and you are at an increased risk for chlamydia or  gonorrhea. Ask your health care provider if you are at risk.  If you do not have HIV, but are at risk, it may be recommended that you take a prescription medicine daily to prevent HIV infection. This is called pre-exposure prophylaxis (PrEP). You are considered at risk if: ? You are sexually active and do not regularly use condoms or know the HIV status of your partner(s). ? You take drugs by injection. ? You are sexually active with a partner who has HIV. Talk with your health care provider about whether you are  at high risk of being infected with HIV. If you choose to begin PrEP, you should first be tested for HIV. You should then be tested every 3 months for as long as you are taking PrEP. Pregnancy  If you are premenopausal and you may become pregnant, ask your health care provider about preconception counseling.  If you may become pregnant, take 400 to 800 micrograms (mcg) of folic acid every day.  If you want to prevent pregnancy, talk to your health care provider about birth control (contraception). Osteoporosis and menopause  Osteoporosis is a disease in which the bones lose minerals and strength with aging. This can result in serious bone fractures. Your risk for osteoporosis can be identified using a bone density scan.  If you are 32 years of age or older, or if you are at risk for osteoporosis and fractures, ask your health care provider if you should be screened.  Ask your health care provider whether you should take a calcium or vitamin D supplement to lower your risk for osteoporosis.  Menopause may have certain physical symptoms and risks.  Hormone replacement therapy may reduce some of these symptoms and risks. Talk to your health care provider about whether hormone replacement therapy is right for you. Follow these instructions at home:  Schedule regular health, dental, and eye exams.  Stay current with your immunizations.  Do not use any tobacco products including  cigarettes, chewing tobacco, or electronic cigarettes.  If you are pregnant, do not drink alcohol.  If you are breastfeeding, limit how much and how often you drink alcohol.  Limit alcohol intake to no more than 1 drink per day for nonpregnant women. One drink equals 12 ounces of beer, 5 ounces of wine, or 1 ounces of hard liquor.  Do not use street drugs.  Do not share needles.  Ask your health care provider for help if you need support or information about quitting drugs.  Tell your health care provider if you often feel depressed.  Tell your health care provider if you have ever been abused or do not feel safe at home. This information is not intended to replace advice given to you by your health care provider. Make sure you discuss any questions you have with your health care provider. Document Released: 05/20/2011 Document Revised: 04/11/2016 Document Reviewed: 08/08/2015 Elsevier Interactive Patient Education  2019 [employer].

## 2018-11-27 NOTE — Assessment & Plan Note (Signed)
DEXA up-to-date Starting to walk Taking calcium and vitamin D daily

## 2018-11-27 NOTE — Assessment & Plan Note (Signed)
Not currently on medication Had [facility] LDL 218, nonfasting She will increase her exercise and improve her diet We will recheck in 6 months

## 2018-11-27 NOTE — Assessment & Plan Note (Addendum)
She is seeing a counselor Discussed options-she deferred blood work/genetic test for best antidepressant, but can consider in the future if needed We will continue 30 mg of Lexapro with goal of tapering off Continue regular exercise Follow-up in 6 months

## 2018-11-27 NOTE — Assessment & Plan Note (Signed)
Recent TSH in normal range-see scanned blood work Continue current medications

## 2018-11-27 NOTE — Assessment & Plan Note (Signed)
Using cpap nightly 

## 2018-11-27 NOTE — Assessment & Plan Note (Addendum)
Overall controlled-follows at the [facility] Gets botox imitrex as needed-refilled

## 2018-11-30 ENCOUNTER — Ambulatory Visit
Admission: RE | Admit: 2018-11-30 | Discharge: 2018-11-30 | Disposition: A | Discharge status: Home / Self Care | Payer: BLUE CROSS/BLUE SHIELD | Source: Ambulatory Visit | Attending: Radiation Oncology | Admitting: Radiation Oncology

## 2018-11-30 DIAGNOSIS — Z510 Encounter for antineoplastic radiation therapy: Secondary | ICD-10-CM | POA: Diagnosis not present

## 2018-11-30 DIAGNOSIS — Z79899 Other long term (current) drug therapy: Secondary | ICD-10-CM | POA: Diagnosis not present

## 2018-11-30 DIAGNOSIS — Z170 Estrogen receptor positive status [ER+]: Secondary | ICD-10-CM | POA: Diagnosis not present

## 2018-11-30 DIAGNOSIS — D0511 Intraductal carcinoma in situ of right breast: Secondary | ICD-10-CM | POA: Diagnosis not present

## 2018-11-30 DIAGNOSIS — C50411 Malignant neoplasm of upper-outer quadrant of right female breast: Secondary | ICD-10-CM | POA: Diagnosis not present

## 2018-12-01 ENCOUNTER — Ambulatory Visit
Admission: RE | Admit: 2018-12-01 | Discharge: 2018-12-01 | Disposition: A | Discharge status: Home / Self Care | Payer: BLUE CROSS/BLUE SHIELD | Source: Ambulatory Visit | Attending: Radiation Oncology | Admitting: Radiation Oncology

## 2018-12-01 DIAGNOSIS — Z79899 Other long term (current) drug therapy: Secondary | ICD-10-CM | POA: Diagnosis not present

## 2018-12-01 DIAGNOSIS — D0511 Intraductal carcinoma in situ of right breast: Secondary | ICD-10-CM

## 2018-12-01 DIAGNOSIS — C50411 Malignant neoplasm of upper-outer quadrant of right female breast: Secondary | ICD-10-CM | POA: Diagnosis not present

## 2018-12-01 DIAGNOSIS — Z170 Estrogen receptor positive status [ER+]: Secondary | ICD-10-CM | POA: Diagnosis not present

## 2018-12-01 DIAGNOSIS — Z510 Encounter for antineoplastic radiation therapy: Secondary | ICD-10-CM | POA: Diagnosis not present

## 2018-12-01 MED ORDER — RADIAPLEXRX EX GEL
Freq: Once | CUTANEOUS | Status: AC
  Administered 2018-12-01: 17:00:00 via TOPICAL

## 2018-12-01 MED ORDER — ALRA NON-METALLIC DEODORANT (RAD-ONC)
1.0000 "application " | Freq: Once | TOPICAL | Status: AC
  Administered 2018-12-01: 1 via TOPICAL

## 2018-12-01 NOTE — Progress Notes (Signed)

## 2018-12-02 ENCOUNTER — Ambulatory Visit
Admission: RE | Admit: 2018-12-02 | Discharge: 2018-12-02 | Disposition: A | Discharge status: Home / Self Care | Payer: BLUE CROSS/BLUE SHIELD | Source: Ambulatory Visit | Attending: Radiation Oncology | Admitting: Radiation Oncology

## 2018-12-02 ENCOUNTER — Encounter: Payer: Self-pay | Admitting: Internal Medicine

## 2018-12-02 DIAGNOSIS — C50411 Malignant neoplasm of upper-outer quadrant of right female breast: Secondary | ICD-10-CM | POA: Diagnosis not present

## 2018-12-02 DIAGNOSIS — D0511 Intraductal carcinoma in situ of right breast: Secondary | ICD-10-CM | POA: Diagnosis not present

## 2018-12-02 DIAGNOSIS — Z510 Encounter for antineoplastic radiation therapy: Secondary | ICD-10-CM | POA: Diagnosis not present

## 2018-12-02 DIAGNOSIS — Z170 Estrogen receptor positive status [ER+]: Secondary | ICD-10-CM | POA: Diagnosis not present

## 2018-12-02 DIAGNOSIS — Z79899 Other long term (current) drug therapy: Secondary | ICD-10-CM | POA: Diagnosis not present

## 2018-12-03 ENCOUNTER — Other Ambulatory Visit: Payer: Self-pay | Admitting: Internal Medicine

## 2018-12-03 ENCOUNTER — Ambulatory Visit
Admission: RE | Admit: 2018-12-03 | Discharge: 2018-12-03 | Disposition: A | Discharge status: Home / Self Care | Payer: BLUE CROSS/BLUE SHIELD | Source: Ambulatory Visit | Attending: Radiation Oncology | Admitting: Radiation Oncology

## 2018-12-03 DIAGNOSIS — Z79899 Other long term (current) drug therapy: Secondary | ICD-10-CM | POA: Diagnosis not present

## 2018-12-03 DIAGNOSIS — D0511 Intraductal carcinoma in situ of right breast: Secondary | ICD-10-CM | POA: Diagnosis not present

## 2018-12-03 DIAGNOSIS — Z510 Encounter for antineoplastic radiation therapy: Secondary | ICD-10-CM | POA: Diagnosis not present

## 2018-12-03 DIAGNOSIS — C50411 Malignant neoplasm of upper-outer quadrant of right female breast: Secondary | ICD-10-CM | POA: Diagnosis not present

## 2018-12-03 DIAGNOSIS — Z170 Estrogen receptor positive status [ER+]: Secondary | ICD-10-CM | POA: Diagnosis not present

## 2018-12-03 MED ORDER — AMLODIPINE BESYLATE 5 MG PO TABS: 5.0000 mg | ORAL_TABLET | Freq: Every day | ORAL | 3 refills | Status: DC

## 2018-12-04 ENCOUNTER — Ambulatory Visit
Admission: RE | Admit: 2018-12-04 | Discharge: 2018-12-04 | Disposition: A | Discharge status: Home / Self Care | Payer: BLUE CROSS/BLUE SHIELD | Source: Ambulatory Visit | Attending: Radiation Oncology | Admitting: Radiation Oncology

## 2018-12-04 ENCOUNTER — Ambulatory Visit: Payer: BLUE CROSS/BLUE SHIELD | Admitting: Internal Medicine

## 2018-12-04 DIAGNOSIS — Z510 Encounter for antineoplastic radiation therapy: Secondary | ICD-10-CM | POA: Diagnosis not present

## 2018-12-04 DIAGNOSIS — C50411 Malignant neoplasm of upper-outer quadrant of right female breast: Secondary | ICD-10-CM | POA: Diagnosis not present

## 2018-12-04 DIAGNOSIS — Z79899 Other long term (current) drug therapy: Secondary | ICD-10-CM | POA: Diagnosis not present

## 2018-12-04 DIAGNOSIS — Z170 Estrogen receptor positive status [ER+]: Secondary | ICD-10-CM | POA: Diagnosis not present

## 2018-12-04 DIAGNOSIS — D0511 Intraductal carcinoma in situ of right breast: Secondary | ICD-10-CM | POA: Diagnosis not present

## 2018-12-07 ENCOUNTER — Ambulatory Visit
Admission: RE | Admit: 2018-12-07 | Discharge: 2018-12-07 | Disposition: A | Discharge status: Home / Self Care | Payer: BLUE CROSS/BLUE SHIELD | Source: Ambulatory Visit | Attending: Radiation Oncology | Admitting: Radiation Oncology

## 2018-12-07 DIAGNOSIS — C50411 Malignant neoplasm of upper-outer quadrant of right female breast: Secondary | ICD-10-CM | POA: Diagnosis not present

## 2018-12-07 DIAGNOSIS — Z79899 Other long term (current) drug therapy: Secondary | ICD-10-CM | POA: Diagnosis not present

## 2018-12-07 DIAGNOSIS — Z510 Encounter for antineoplastic radiation therapy: Secondary | ICD-10-CM | POA: Diagnosis not present

## 2018-12-07 DIAGNOSIS — Z170 Estrogen receptor positive status [ER+]: Secondary | ICD-10-CM | POA: Diagnosis not present

## 2018-12-07 DIAGNOSIS — D0511 Intraductal carcinoma in situ of right breast: Secondary | ICD-10-CM | POA: Diagnosis not present

## 2018-12-08 ENCOUNTER — Ambulatory Visit
Admission: RE | Admit: 2018-12-08 | Discharge: 2018-12-08 | Disposition: A | Discharge status: Home / Self Care | Payer: BLUE CROSS/BLUE SHIELD | Source: Ambulatory Visit | Attending: Radiation Oncology | Admitting: Radiation Oncology

## 2018-12-08 DIAGNOSIS — C50411 Malignant neoplasm of upper-outer quadrant of right female breast: Secondary | ICD-10-CM | POA: Diagnosis not present

## 2018-12-08 DIAGNOSIS — D0511 Intraductal carcinoma in situ of right breast: Secondary | ICD-10-CM | POA: Diagnosis not present

## 2018-12-08 DIAGNOSIS — Z510 Encounter for antineoplastic radiation therapy: Secondary | ICD-10-CM | POA: Diagnosis not present

## 2018-12-08 DIAGNOSIS — Z170 Estrogen receptor positive status [ER+]: Secondary | ICD-10-CM | POA: Diagnosis not present

## 2018-12-08 DIAGNOSIS — Z79899 Other long term (current) drug therapy: Secondary | ICD-10-CM | POA: Diagnosis not present

## 2018-12-09 ENCOUNTER — Ambulatory Visit
Admission: RE | Admit: 2018-12-09 | Discharge: 2018-12-09 | Disposition: A | Discharge status: Home / Self Care | Payer: BLUE CROSS/BLUE SHIELD | Source: Ambulatory Visit | Attending: Radiation Oncology | Admitting: Radiation Oncology

## 2018-12-09 DIAGNOSIS — C50411 Malignant neoplasm of upper-outer quadrant of right female breast: Secondary | ICD-10-CM | POA: Diagnosis not present

## 2018-12-09 DIAGNOSIS — D0511 Intraductal carcinoma in situ of right breast: Secondary | ICD-10-CM | POA: Diagnosis not present

## 2018-12-09 DIAGNOSIS — Z79899 Other long term (current) drug therapy: Secondary | ICD-10-CM | POA: Diagnosis not present

## 2018-12-09 DIAGNOSIS — Z170 Estrogen receptor positive status [ER+]: Secondary | ICD-10-CM | POA: Diagnosis not present

## 2018-12-09 DIAGNOSIS — Z510 Encounter for antineoplastic radiation therapy: Secondary | ICD-10-CM | POA: Diagnosis not present

## 2018-12-10 ENCOUNTER — Ambulatory Visit
Admission: RE | Admit: 2018-12-10 | Discharge: 2018-12-10 | Disposition: A | Discharge status: Home / Self Care | Payer: BLUE CROSS/BLUE SHIELD | Source: Ambulatory Visit | Attending: Radiation Oncology | Admitting: Radiation Oncology

## 2018-12-10 DIAGNOSIS — C50411 Malignant neoplasm of upper-outer quadrant of right female breast: Secondary | ICD-10-CM | POA: Diagnosis not present

## 2018-12-10 DIAGNOSIS — Z170 Estrogen receptor positive status [ER+]: Secondary | ICD-10-CM | POA: Diagnosis not present

## 2018-12-10 DIAGNOSIS — Z510 Encounter for antineoplastic radiation therapy: Secondary | ICD-10-CM | POA: Diagnosis not present

## 2018-12-10 DIAGNOSIS — Z79899 Other long term (current) drug therapy: Secondary | ICD-10-CM | POA: Diagnosis not present

## 2018-12-10 DIAGNOSIS — D0511 Intraductal carcinoma in situ of right breast: Secondary | ICD-10-CM | POA: Diagnosis not present

## 2018-12-11 ENCOUNTER — Ambulatory Visit
Admission: RE | Admit: 2018-12-11 | Discharge: 2018-12-11 | Disposition: A | Discharge status: Home / Self Care | Payer: BLUE CROSS/BLUE SHIELD | Source: Ambulatory Visit | Attending: Radiation Oncology | Admitting: Radiation Oncology

## 2018-12-11 DIAGNOSIS — Z170 Estrogen receptor positive status [ER+]: Secondary | ICD-10-CM | POA: Diagnosis not present

## 2018-12-11 DIAGNOSIS — D0511 Intraductal carcinoma in situ of right breast: Secondary | ICD-10-CM | POA: Diagnosis not present

## 2018-12-11 DIAGNOSIS — C50411 Malignant neoplasm of upper-outer quadrant of right female breast: Secondary | ICD-10-CM | POA: Diagnosis not present

## 2018-12-11 DIAGNOSIS — Z510 Encounter for antineoplastic radiation therapy: Secondary | ICD-10-CM | POA: Diagnosis not present

## 2018-12-11 DIAGNOSIS — Z79899 Other long term (current) drug therapy: Secondary | ICD-10-CM | POA: Diagnosis not present

## 2018-12-14 ENCOUNTER — Ambulatory Visit
Admission: RE | Admit: 2018-12-14 | Discharge: 2018-12-14 | Disposition: A | Discharge status: Home / Self Care | Payer: BLUE CROSS/BLUE SHIELD | Source: Ambulatory Visit | Attending: Radiation Oncology | Admitting: Radiation Oncology

## 2018-12-14 DIAGNOSIS — Z170 Estrogen receptor positive status [ER+]: Secondary | ICD-10-CM | POA: Diagnosis not present

## 2018-12-14 DIAGNOSIS — Z510 Encounter for antineoplastic radiation therapy: Secondary | ICD-10-CM | POA: Diagnosis not present

## 2018-12-14 DIAGNOSIS — D0511 Intraductal carcinoma in situ of right breast: Secondary | ICD-10-CM | POA: Diagnosis not present

## 2018-12-14 DIAGNOSIS — Z79899 Other long term (current) drug therapy: Secondary | ICD-10-CM | POA: Diagnosis not present

## 2018-12-14 DIAGNOSIS — C50411 Malignant neoplasm of upper-outer quadrant of right female breast: Secondary | ICD-10-CM | POA: Diagnosis not present

## 2018-12-15 ENCOUNTER — Ambulatory Visit
Admission: RE | Admit: 2018-12-15 | Discharge: 2018-12-15 | Disposition: A | Discharge status: Home / Self Care | Payer: BLUE CROSS/BLUE SHIELD | Source: Ambulatory Visit | Attending: Radiation Oncology | Admitting: Radiation Oncology

## 2018-12-15 DIAGNOSIS — Z853 Personal history of malignant neoplasm of breast: Secondary | ICD-10-CM | POA: Insufficient documentation

## 2018-12-15 DIAGNOSIS — Z170 Estrogen receptor positive status [ER+]: Secondary | ICD-10-CM | POA: Diagnosis not present

## 2018-12-15 DIAGNOSIS — Z79899 Other long term (current) drug therapy: Secondary | ICD-10-CM | POA: Diagnosis not present

## 2018-12-15 DIAGNOSIS — D0511 Intraductal carcinoma in situ of right breast: Secondary | ICD-10-CM | POA: Diagnosis not present

## 2018-12-15 DIAGNOSIS — C50411 Malignant neoplasm of upper-outer quadrant of right female breast: Secondary | ICD-10-CM | POA: Insufficient documentation

## 2018-12-15 DIAGNOSIS — Z510 Encounter for antineoplastic radiation therapy: Secondary | ICD-10-CM | POA: Diagnosis not present

## 2018-12-15 NOTE — Progress Notes (Signed)
.  Simulation verification  The patient was brought to the treatment machine and placed in the plan treatment position.  Clinical set up was verified to ensure that the target region is appropriately covered for the patient's upcoming electron boost treatment.  The targeted volume of tissue is appropriately covered by the radiation field.  Based on my personal review, I approve the simulation verification.  The patient's treatment will proceed as planned.  ------------------------------------------------  -----------------------------------  Tracie Hart D. Ewel Lona, PhD, MD  

## 2018-12-16 ENCOUNTER — Ambulatory Visit
Admission: RE | Admit: 2018-12-16 | Discharge: 2018-12-16 | Disposition: A | Discharge status: Home / Self Care | Payer: BLUE CROSS/BLUE SHIELD | Source: Ambulatory Visit | Attending: Radiation Oncology | Admitting: Radiation Oncology

## 2018-12-16 DIAGNOSIS — D0511 Intraductal carcinoma in situ of right breast: Secondary | ICD-10-CM | POA: Diagnosis not present

## 2018-12-16 DIAGNOSIS — Z79899 Other long term (current) drug therapy: Secondary | ICD-10-CM | POA: Diagnosis not present

## 2018-12-16 DIAGNOSIS — C50411 Malignant neoplasm of upper-outer quadrant of right female breast: Secondary | ICD-10-CM | POA: Diagnosis not present

## 2018-12-16 DIAGNOSIS — Z510 Encounter for antineoplastic radiation therapy: Secondary | ICD-10-CM | POA: Diagnosis not present

## 2018-12-16 DIAGNOSIS — Z170 Estrogen receptor positive status [ER+]: Secondary | ICD-10-CM | POA: Diagnosis not present

## 2018-12-17 ENCOUNTER — Ambulatory Visit
Admission: RE | Admit: 2018-12-17 | Discharge: 2018-12-17 | Disposition: A | Discharge status: Home / Self Care | Payer: BLUE CROSS/BLUE SHIELD | Source: Ambulatory Visit | Attending: Radiation Oncology | Admitting: Radiation Oncology

## 2018-12-17 DIAGNOSIS — Z79899 Other long term (current) drug therapy: Secondary | ICD-10-CM | POA: Diagnosis not present

## 2018-12-17 DIAGNOSIS — Z510 Encounter for antineoplastic radiation therapy: Secondary | ICD-10-CM | POA: Diagnosis not present

## 2018-12-17 DIAGNOSIS — C50411 Malignant neoplasm of upper-outer quadrant of right female breast: Secondary | ICD-10-CM | POA: Diagnosis not present

## 2018-12-17 DIAGNOSIS — Z170 Estrogen receptor positive status [ER+]: Secondary | ICD-10-CM | POA: Diagnosis not present

## 2018-12-18 ENCOUNTER — Ambulatory Visit
Admission: RE | Admit: 2018-12-18 | Discharge: 2018-12-18 | Disposition: A | Discharge status: Home / Self Care | Payer: BLUE CROSS/BLUE SHIELD | Source: Ambulatory Visit | Attending: Radiation Oncology | Admitting: Radiation Oncology

## 2018-12-18 DIAGNOSIS — Z510 Encounter for antineoplastic radiation therapy: Secondary | ICD-10-CM | POA: Diagnosis not present

## 2018-12-18 DIAGNOSIS — C50411 Malignant neoplasm of upper-outer quadrant of right female breast: Secondary | ICD-10-CM | POA: Diagnosis not present

## 2018-12-18 DIAGNOSIS — Z170 Estrogen receptor positive status [ER+]: Secondary | ICD-10-CM | POA: Diagnosis not present

## 2018-12-18 DIAGNOSIS — Z79899 Other long term (current) drug therapy: Secondary | ICD-10-CM | POA: Diagnosis not present

## 2018-12-21 ENCOUNTER — Ambulatory Visit
Admission: RE | Admit: 2018-12-21 | Discharge: 2018-12-21 | Disposition: A | Discharge status: Home / Self Care | Payer: BLUE CROSS/BLUE SHIELD | Source: Ambulatory Visit | Attending: Radiation Oncology | Admitting: Radiation Oncology

## 2018-12-21 DIAGNOSIS — Z510 Encounter for antineoplastic radiation therapy: Secondary | ICD-10-CM | POA: Insufficient documentation

## 2018-12-21 DIAGNOSIS — C50411 Malignant neoplasm of upper-outer quadrant of right female breast: Secondary | ICD-10-CM | POA: Insufficient documentation

## 2018-12-21 DIAGNOSIS — Z170 Estrogen receptor positive status [ER+]: Secondary | ICD-10-CM | POA: Diagnosis not present

## 2018-12-21 DIAGNOSIS — Z79899 Other long term (current) drug therapy: Secondary | ICD-10-CM | POA: Diagnosis not present

## 2018-12-22 ENCOUNTER — Telehealth: Payer: Self-pay | Admitting: Hematology and Oncology

## 2018-12-22 ENCOUNTER — Ambulatory Visit
Admission: RE | Admit: 2018-12-22 | Discharge: 2018-12-22 | Disposition: A | Discharge status: Home / Self Care | Payer: BLUE CROSS/BLUE SHIELD | Source: Ambulatory Visit | Attending: Radiation Oncology | Admitting: Radiation Oncology

## 2018-12-22 DIAGNOSIS — C50411 Malignant neoplasm of upper-outer quadrant of right female breast: Secondary | ICD-10-CM | POA: Diagnosis not present

## 2018-12-22 DIAGNOSIS — Z170 Estrogen receptor positive status [ER+]: Secondary | ICD-10-CM | POA: Diagnosis not present

## 2018-12-22 DIAGNOSIS — Z79899 Other long term (current) drug therapy: Secondary | ICD-10-CM | POA: Diagnosis not present

## 2018-12-22 DIAGNOSIS — Z510 Encounter for antineoplastic radiation therapy: Secondary | ICD-10-CM | POA: Diagnosis not present

## 2018-12-22 NOTE — Telephone Encounter (Signed)
Patient came in to reschedule appt.  Okay per MD nurse.

## 2018-12-23 ENCOUNTER — Encounter: Payer: Self-pay | Admitting: Radiation Oncology

## 2018-12-23 ENCOUNTER — Ambulatory Visit: Payer: BLUE CROSS/BLUE SHIELD | Admitting: Hematology and Oncology

## 2018-12-23 ENCOUNTER — Ambulatory Visit
Admission: RE | Admit: 2018-12-23 | Discharge: 2018-12-23 | Disposition: A | Discharge status: Home / Self Care | Payer: BLUE CROSS/BLUE SHIELD | Source: Ambulatory Visit | Attending: Radiation Oncology | Admitting: Radiation Oncology

## 2018-12-23 DIAGNOSIS — D0511 Intraductal carcinoma in situ of right breast: Secondary | ICD-10-CM | POA: Diagnosis not present

## 2018-12-23 DIAGNOSIS — C50411 Malignant neoplasm of upper-outer quadrant of right female breast: Secondary | ICD-10-CM | POA: Diagnosis not present

## 2018-12-23 DIAGNOSIS — Z170 Estrogen receptor positive status [ER+]: Secondary | ICD-10-CM | POA: Diagnosis not present

## 2018-12-23 DIAGNOSIS — Z510 Encounter for antineoplastic radiation therapy: Secondary | ICD-10-CM | POA: Diagnosis not present

## 2018-12-23 DIAGNOSIS — Z79899 Other long term (current) drug therapy: Secondary | ICD-10-CM | POA: Diagnosis not present

## 2018-12-24 NOTE — Progress Notes (Signed)
  Radiation Oncology         [phone]) [phone] ________________________________  Name: Tracie Hart MRN: [id number]  Date: 12/23/2018  DOB: 04/30/1956  End of Treatment Note  Diagnosis:   63 y.o. female with high gradeDCISof the right breast,pTis pNX    Indication for treatment:  Curative       Radiation treatment dates:   11/26/2018 - 12/23/2018  Site/dose:    1. Right Breast / 40.05 Gy in 15 fractions 2. Boost / 10 Gy in 5 fractions  Beams/energy:    1. 3D / 6X, 10X Photon 2. 15E electron boost  Narrative: The patient tolerated radiation treatment very well.   By the end of treatment, the right breast area showed radiation dermatitis in the upper inner quadrant, but no skin breakdown. She is currently using Neosporin and Radiaplex gel as ordered. She also noted increased fatigue.  Plan: The patient has completed radiation treatment. The patient will return to radiation oncology clinic for routine followup in one month. I advised them to call or return sooner if they have any questions or concerns related to their recovery or treatment.  -----------------------------------  Blair Promise, PhD, MD  This document serves as a record of services personally performed by Gery Pray, MD. It was created on his behalf by Rae Lips, a trained medical scribe. The creation of this record is based on the scribe's personal observations and the provider's statements to them. This document has been checked and approved by the attending provider.

## 2018-12-27 DIAGNOSIS — G4733 Obstructive sleep apnea (adult) (pediatric): Secondary | ICD-10-CM | POA: Diagnosis not present

## 2018-12-28 DIAGNOSIS — G43719 Chronic migraine without aura, intractable, without status migrainosus: Secondary | ICD-10-CM | POA: Diagnosis not present

## 2018-12-28 DIAGNOSIS — M542 Cervicalgia: Secondary | ICD-10-CM | POA: Diagnosis not present

## 2018-12-28 DIAGNOSIS — G518 Other disorders of facial nerve: Secondary | ICD-10-CM | POA: Diagnosis not present

## 2018-12-28 DIAGNOSIS — M791 Myalgia, unspecified site: Secondary | ICD-10-CM | POA: Diagnosis not present

## 2018-12-29 NOTE — Progress Notes (Signed)
Subjective:    Patient ID: Tracie Hart, female    DOB: 1955-11-25, 63 y.o.   MRN: [id number]  HPI The patient is here for an acute visit.   Last weekend she was having a BM and started throwing up.  Her stool was loose and typically is when she has these episodes.  She has had these episodes every few months for a long time.  She has seen GI regarding this-last October.  It was planned to have her undergo an EGD and colonoscopy, but she was having significant back pain and then was diagnosed with breast cancer and has not been able to schedule these tests.  She is interested in scheduling them and will contact GI.  She had 2 recent episodes, which is why she is here today.  She typically has upper abdominal pain with these episodes and her more recent episodes the pain has been lasting longer.  She does have some mild pain across her upper abdomen.  Eating tends to make her pain worse.  She does have constant reflux.  She is taking her medication daily.  She also has mild nausea.  Besides having these intermittent loose stools her stools are relatively normal.  3 days ago she also started having left lower quadrant/suprapubic pain.  She denies any activity that may have caused this.  The pain feels internal, but hurts to touch in this area when she palpates her abdomen.  Pain radiates downward towards the vagina.  It is constant.  Nothing makes it better or worse.  She was concerned about her ovary, especially given her recent treatment with breast cancer.  She is up-to-date with her gynecological visits.  She sees her gynecologist in the summer.  Recently she has had some difficulty urinating.  She denies any dysuria, increased urinary frequency and hematuria.  She has not had any diarrhea, constipation or blood in the stool.  She denies any fevers or chills.  Hyperlipidemia: She has been working on changing her diet and has lost weight.  She is concerned about her cholesterol and is  interested in having that rechecked.  Rash and right breast: She has completed radiation treatment.  She was experiencing nipple pain and it was irritated by her bra.  She placed a Kleenex in between the breast and the bra to see if that would help with the irritation, but she developed a rash in the area that the Kleenex was covering.  The redness from the radiation resolved, but this rash has not resolved or change.  It is slightly itchy, feels irritated, but she denies any pain.  It is slightly warm to touch.  She has tried her radiation cream and moisturizing cream and that has not helped.     Medications and allergies reviewed with patient and updated if appropriate.  Patient Active Problem List   Diagnosis Date Noted  . Malignant neoplasm of upper-outer quadrant of right female breast ([place]) 12/15/2018  . Ductal carcinoma in situ (DCIS) of right breast 10/08/2018  . Osteopenia 09/08/2018  . Loose stools 07/28/2018  . Dizziness 03/17/2018  . Pain of upper abdomen 03/17/2018  . Chronic pain of left knee 03/17/2018  . Multiple lipomas 01/05/2018  . Hypothyroidism 11/03/2017  . Depression 11/03/2017  . Family history of diabetes mellitus (DM) 11/03/2017  . Migraines 11/03/2017  . GERD (gastroesophageal reflux disease) 11/03/2017  . OSA (obstructive sleep apnea) 11/03/2017  . Hyperlipidemia 11/03/2017  . Overactive bladder 11/03/2017    Current  Outpatient Medications on File Prior to Visit  Medication Sig Dispense Refill  . amLODipine (NORVASC) 5 MG tablet Take 1 tablet (5 mg total) by mouth daily. 90 tablet 3  . calcium-vitamin D (OSCAL WITH D) 500-200 MG-UNIT tablet Take 1 tablet by mouth.    . COLLAGEN PO Take by mouth.    . escitalopram (LEXAPRO) 10 MG tablet TAKE 1 TABLET(10 MG) BY MOUTH DAILY 90 tablet 0  . escitalopram (LEXAPRO) 20 MG tablet TAKE 1 TABLET(20 MG) BY MOUTH DAILY 90 tablet 0  . lansoprazole (PREVACID) 30 MG capsule TAKE ONE CAPSULE BY MOUTH DAILY AT NOON 90  capsule 0  . levothyroxine (SYNTHROID, LEVOTHROID) 50 MCG tablet TAKE 1 TABLET(50 MCG) BY MOUTH DAILY BEFORE BREAKFAST 90 tablet 0  . Magnesium 400 MG TABS Take 200 mg by mouth daily.     . SUMAtriptan (IMITREX) 100 MG tablet Take 1 tablet (100 mg total) by mouth every 2 (two) hours as needed for migraine. May repeat in 2 hours if headache persists or recurs. 10 tablet 8  . tamoxifen (NOLVADEX) 10 MG tablet Take 0.5 tablets (5 mg total) by mouth 2 (two) times daily. 90 tablet 1  . vitamin B-12 (CYANOCOBALAMIN) 500 MCG tablet Take 1,000 mcg by mouth daily.      No current facility-administered medications on file prior to visit.     Past Medical History:  Diagnosis Date  . Anemia   . Anxiety   . Depression   . GERD (gastroesophageal reflux disease)   . Migraine   . Migraine   . Osteopenia   . Sleep apnea with use of continuous positive airway pressure (CPAP)   . Thyroid disease     Past Surgical History:  Procedure Laterality Date  . BREAST LUMPECTOMY WITH RADIOACTIVE SEED LOCALIZATION Right 10/22/2018   Procedure: BREAST LUMPECTOMY WITH RADIOACTIVE SEED LOCALIZATION;  Surgeon: Excell Seltzer, MD;  Location: [facility];  Service: General;  Laterality: Right;  . GALLBLADDER SURGERY    . KNEE ARTHROSCOPY Left   . NEUROFIBROMA EXCISION  1986  . PALATE / UVULA BIOPSY / EXCISION      Social History   Socioeconomic History  . Marital status: Legally Separated    Spouse name: Not on file  . Number of children: 1  . Years of education: Not on file  . Highest education level: Not on file  Occupational History  . Occupation: retired  Scientific laboratory technician  . Financial resource strain: Not on file  . Food insecurity:    Worry: Not on file    Inability: Not on file  . Transportation needs:    Medical: Not on file    Non-medical: Not on file  Tobacco Use  . Smoking status: Never Smoker  . Smokeless tobacco: Never Used  Substance and Sexual Activity  . Alcohol use:  Yes    Comment: rarely  . Drug use: No  . Sexual activity: Never  Lifestyle  . Physical activity:    Days per week: Not on file    Minutes per session: Not on file  . Stress: Not on file  Relationships  . Social connections:    Talks on phone: Not on file    Gets together: Not on file    Attends religious service: Not on file    Active member of club or organization: Not on file    Attends meetings of clubs or organizations: Not on file    Relationship status: Not on file  Other  Topics Concern  . Not on file  Social History Narrative   No regular exercise    Family History  Problem Relation Age of Onset  . Arthritis Mother   . Diabetes Mother   . Heart disease Mother   . Hypertension Mother   . Stroke Mother   . Arthritis Father   . Depression Father   . Diabetes Father   . Hyperlipidemia Father   . Hypertension Father   . Depression Brother   . Hypertension Brother   . Heart disease Maternal Grandmother   . COPD Paternal Grandmother   . COPD Paternal Grandfather   . Colon cancer Paternal Grandfather     Review of Systems  Constitutional: Negative for chills and fever.  Cardiovascular: Negative for chest pain.  Gastrointestinal: Positive for abdominal pain and nausea. Negative for blood in stool, constipation and diarrhea.  Genitourinary: Positive for difficulty urinating. Negative for dysuria, frequency and hematuria.  Skin: Positive for rash.  Neurological: Negative for light-headedness and headaches.       Objective:   Vitals:   12/30/18 1315  BP: 108/70  Pulse: 99  Resp: 16  Temp: 98.8 F (37.1 C)  SpO2: 96%   BP Readings from Last 3 Encounters:  12/30/18 108/70  12/30/18 125/83  11/27/18 (!) 146/92   Wt Readings from Last 3 Encounters:  12/30/18 146 lb (66.2 kg)  12/30/18 145 lb 9.6 oz (66 kg)  11/27/18 148 lb 12.8 oz (67.5 kg)   Body mass index is 25.06 kg/m.   Physical Exam Constitutional:      General: She is not in acute  distress.    Appearance: She is well-developed and normal weight.  HENT:     Head: Normocephalic and atraumatic.  Abdominal:     Palpations: Abdomen is soft. There is no hepatomegaly, splenomegaly or mass.     Tenderness: There is abdominal tenderness in the epigastric area, left upper quadrant and left lower quadrant. There is no guarding or rebound. Negative signs include Murphy's sign and McBurney's sign.     Hernia: No hernia is present.  Skin:    Findings: Rash (right breast - mild warmth, non tender, defined borders) present.  Neurological:     Mental Status: She is alert.            Assessment & Plan:    See Problem List for Assessment and Plan of chronic medical problems.

## 2018-12-29 NOTE — Progress Notes (Signed)
Patient Care Team: Binnie Rail, MD as PCP - General (Internal Medicine)  DIAGNOSIS:    ICD-10-CM   1. Ductal carcinoma in situ (DCIS) of right breast D05.11     SUMMARY OF ONCOLOGIC HISTORY:   Ductal carcinoma in situ (DCIS) of right breast   09/21/2018 Initial Diagnosis    Palpable right breast mass at 12:30 position 2.4 x 0.8 x 3 cm lipoma; screening detected right breast calcifications 1.3 cm linear orientation right breast upper outer quadrant; biopsy: DCIS high-grade, comedo pattern ER 10%, PR 0%, Tis NX stage 0    10/22/2018 Surgery    Rt Lumpectomy: DCIS IG to HG ER 10% PR 0%, TisNx Stage 0    11/27/2018 - 12/23/2018 Radiation Therapy    Adjuvant radiation     12/30/2018 -  Anti-estrogen oral therapy    Tamoxifen 5mg  daily to start 01/2019, plan for 5 years     CHIEF COMPLIANT: Follow-up after radiation to begin anti-estrogen therapy  INTERVAL HISTORY: Tracie Hart is a 63 y.o. with above-mentioned history of right breast DCIS treated with lumpectomy and adjuvant radiation therapy, which was completed on 12/23/18. She presents to the clinic alone today and notes she is very sore at her radiation site and has redness and radiation dermatitis along with pain in the nipple. She was recently put on high blood pressure medication and has lost six pounds in an effort to come off of it soon. She is getting a dog in 2 weeks and plans to walk a lot with him. She reviewed her medication list with me.   REVIEW OF SYSTEMS:   Constitutional: Denies fevers, chills or abnormal weight loss Eyes: Denies blurriness of vision Ears, nose, mouth, throat, and face: Denies mucositis or sore throat Respiratory: Denies cough, dyspnea or wheezes Cardiovascular: Denies palpitation, chest discomfort Gastrointestinal: Denies nausea, heartburn or change in bowel habits Skin: (+) radiation dermatitis  Lymphatics: Denies new lymphadenopathy or easy bruising Neurological: Denies numbness, tingling  or new weaknesses Behavioral/Psych: Mood is stable, no new changes  Extremities: No lower extremity edema Breast: denies lumps or nodules in either breasts (+) pain in right nipple All other systems were reviewed with the patient and are negative.  I have reviewed the past medical history, past surgical history, social history and family history with the patient and they are unchanged from previous note.  ALLERGIES:  is allergic to codeine.  MEDICATIONS:  Current Outpatient Medications  Medication Sig Dispense Refill  . amLODipine (NORVASC) 5 MG tablet Take 1 tablet (5 mg total) by mouth daily. 90 tablet 3  . calcium-vitamin D (OSCAL WITH D) 500-200 MG-UNIT tablet Take 1 tablet by mouth.    . COLLAGEN PO Take by mouth.    . escitalopram (LEXAPRO) 10 MG tablet TAKE 1 TABLET(10 MG) BY MOUTH DAILY 90 tablet 0  . escitalopram (LEXAPRO) 20 MG tablet TAKE 1 TABLET(20 MG) BY MOUTH DAILY 90 tablet 0  . lansoprazole (PREVACID) 30 MG capsule TAKE ONE CAPSULE BY MOUTH DAILY AT NOON 90 capsule 0  . levothyroxine (SYNTHROID, LEVOTHROID) 50 MCG tablet TAKE 1 TABLET(50 MCG) BY MOUTH DAILY BEFORE BREAKFAST 90 tablet 0  . Magnesium 400 MG TABS Take 200 mg by mouth daily.     . SUMAtriptan (IMITREX) 100 MG tablet Take 1 tablet (100 mg total) by mouth every 2 (two) hours as needed for migraine. May repeat in 2 hours if headache persists or recurs. 10 tablet 8  . tamoxifen (NOLVADEX) 10 MG tablet  Take 0.5 tablets (5 mg total) by mouth 2 (two) times daily. 90 tablet 1  . vitamin B-12 (CYANOCOBALAMIN) 500 MCG tablet Take 1,000 mcg by mouth daily.      No current facility-administered medications for this visit.     PHYSICAL EXAMINATION: ECOG PERFORMANCE STATUS: 0 - Asymptomatic  Vitals:   12/30/18 0902  BP: 125/83  Pulse: 87  Resp: 18  Temp: 98.9 F (37.2 C)  SpO2: 98%   Filed Weights   12/30/18 0902  Weight: 145 lb 9.6 oz (66 kg)    GENERAL: alert, no distress and comfortable SKIN: skin  color, texture, turgor are normal, no rashes or significant lesions EYES: normal, Conjunctiva are pink and non-injected, sclera clear OROPHARYNX: no exudate, no erythema and lips, buccal mucosa, and tongue normal  NECK: supple, thyroid normal size, non-tender, without nodularity LYMPH: no palpable lymphadenopathy in the cervical, axillary or inguinal LUNGS: clear to auscultation and percussion with normal breathing effort HEART: regular rate & rhythm and no murmurs and no lower extremity edema ABDOMEN: abdomen soft, non-tender and normal bowel sounds MUSCULOSKELETAL: no cyanosis of digits and no clubbing  NEURO: alert & oriented x 3 with fluent speech, no focal motor/sensory deficits EXTREMITIES: No lower extremity edema Breast: Radiation dermatitis  LABORATORY DATA:  I have reviewed the data as listed CMP Latest Ref Rng & Units 03/17/2018  Glucose 70 - 99 mg/dL 91  BUN 6 - 23 mg/dL 19  Creatinine 0.40 - 1.20 mg/dL 0.75  Sodium 135 - 145 mEq/L 140  Potassium 3.5 - 5.1 mEq/L 3.9  Chloride 96 - 112 mEq/L 102  CO2 19 - 32 mEq/L 31  Calcium 8.4 - 10.5 mg/dL 9.2  Total Protein 6.0 - 8.3 g/dL 6.7  Total Bilirubin 0.2 - 1.2 mg/dL 0.6  Alkaline Phos 39 - 117 U/L 56  AST 0 - 37 U/L 14  ALT 0 - 35 U/L 13    Lab Results  Component Value Date   WBC 5.7 03/17/2018   HGB 14.5 03/17/2018   HCT 43.0 03/17/2018   MCV 82.7 03/17/2018   PLT 130.0 (L) 03/17/2018   NEUTROABS 3.9 03/17/2018    ASSESSMENT & PLAN:  Ductal carcinoma in situ (DCIS) of right breast 09/21/2018 palpable right breast mass at 12:30 position 2.4 x 0.8 x 3 cm lipoma; screening detected right breast calcifications 1.3 cm linear orientation right breast upper outer quadrant; biopsy: DCIS high-grade, comedo pattern ER 10%, PR 0%, Tis NX stage 0  10/22/18: Rt Lumpectomy: DCIS IG to HG ER 10% PR 0%, TisNx Stage 0 Adjuvant radiation therapy 11/27/2018-12/23/2018  Treatment plan: Tamoxifen 5 mg daily because of serious concerns  regarding stroke based on TAM-01 clinical trial data that showed benefit even at 5 mg dose. Patient understands risks and benefits of tamoxifen and is willing to take it at the low-dose of 5 mg. Encouraged her to start exercising.  She is getting a dog very soon and will be more active. Return to clinic in 3 months for survivorship care plan visit   No orders of the defined types were placed in this encounter.  The patient has a good understanding of the overall plan. she agrees with it. she will call with any problems that may develop before the next visit here.  Nicholas Lose, MD 12/30/2018  Julious Oka Dorshimer am acting as scribe for Dr. Nicholas Lose.  I have reviewed the above documentation for accuracy and completeness, and I agree with the above.

## 2018-12-30 ENCOUNTER — Telehealth: Payer: Self-pay | Admitting: Hematology and Oncology

## 2018-12-30 ENCOUNTER — Encounter: Payer: Self-pay | Admitting: Internal Medicine

## 2018-12-30 ENCOUNTER — Other Ambulatory Visit (INDEPENDENT_AMBULATORY_CARE_PROVIDER_SITE_OTHER): Payer: BLUE CROSS/BLUE SHIELD

## 2018-12-30 ENCOUNTER — Ambulatory Visit
Admission: RE | Admit: 2018-12-30 | Discharge: 2018-12-30 | Disposition: A | Discharge status: Home / Self Care | Payer: BLUE CROSS/BLUE SHIELD | Source: Ambulatory Visit | Attending: Internal Medicine | Admitting: Internal Medicine

## 2018-12-30 ENCOUNTER — Ambulatory Visit (INDEPENDENT_AMBULATORY_CARE_PROVIDER_SITE_OTHER): Payer: BLUE CROSS/BLUE SHIELD | Admitting: Internal Medicine

## 2018-12-30 ENCOUNTER — Inpatient Hospital Stay: Payer: BLUE CROSS/BLUE SHIELD | Attending: Hematology and Oncology | Admitting: Hematology and Oncology

## 2018-12-30 VITALS — BP 108/70 | HR 99 | Temp 98.8°F | Resp 16 | Ht 64.0 in | Wt 146.0 lb

## 2018-12-30 DIAGNOSIS — R1032 Left lower quadrant pain: Secondary | ICD-10-CM

## 2018-12-30 DIAGNOSIS — R39198 Other difficulties with micturition: Secondary | ICD-10-CM

## 2018-12-30 DIAGNOSIS — E7849 Other hyperlipidemia: Secondary | ICD-10-CM

## 2018-12-30 DIAGNOSIS — Z170 Estrogen receptor positive status [ER+]: Secondary | ICD-10-CM | POA: Diagnosis not present

## 2018-12-30 DIAGNOSIS — R21 Rash and other nonspecific skin eruption: Secondary | ICD-10-CM | POA: Diagnosis not present

## 2018-12-30 DIAGNOSIS — D259 Leiomyoma of uterus, unspecified: Secondary | ICD-10-CM | POA: Diagnosis not present

## 2018-12-30 DIAGNOSIS — Z79810 Long term (current) use of selective estrogen receptor modulators (SERMs): Secondary | ICD-10-CM | POA: Diagnosis not present

## 2018-12-30 DIAGNOSIS — D0511 Intraductal carcinoma in situ of right breast: Secondary | ICD-10-CM | POA: Diagnosis not present

## 2018-12-30 DIAGNOSIS — K219 Gastro-esophageal reflux disease without esophagitis: Secondary | ICD-10-CM

## 2018-12-30 LAB — COMPREHENSIVE METABOLIC PANEL
ALT: 24 U/L (ref 0–35)
AST: 19 U/L (ref 0–37)
Albumin: 4.5 g/dL (ref 3.5–5.2)
Alkaline Phosphatase: 52 U/L (ref 39–117)
BILIRUBIN TOTAL: 0.5 mg/dL (ref 0.2–1.2)
BUN: 21 mg/dL (ref 6–23)
CO2: 29 mEq/L (ref 19–32)
Calcium: 9.9 mg/dL (ref 8.4–10.5)
Chloride: 101 mEq/L (ref 96–112)
Creatinine, Ser: 0.74 mg/dL (ref 0.40–1.20)
GFR: 79.38 mL/min (ref >60.00)
Glucose, Bld: 89 mg/dL (ref 70–99)
Potassium: 4.1 mEq/L (ref 3.5–5.1)
Sodium: 138 mEq/L (ref 135–145)
Total Protein: 7 g/dL (ref 6.0–8.3)

## 2018-12-30 LAB — URINALYSIS, ROUTINE W REFLEX MICROSCOPIC
Bilirubin Urine: NEGATIVE
Ketones, ur: NEGATIVE
Leukocytes,Ua: NEGATIVE
Nitrite: NEGATIVE
PH: 5.5 (ref 5.0–8.0)
Specific Gravity, Urine: 1.015 (ref 1.000–1.030)
Total Protein, Urine: NEGATIVE
Urine Glucose: NEGATIVE
Urobilinogen, UA: 0.2 (ref 0.0–1.0)

## 2018-12-30 LAB — CBC WITH DIFFERENTIAL/PLATELET
Basophils Absolute: 0 10*3/uL (ref 0.0–0.1)
Basophils Relative: 0.8 % (ref 0.0–3.0)
Eosinophils Absolute: 0 10*3/uL (ref 0.0–0.7)
Eosinophils Relative: 0.8 % (ref 0.0–5.0)
HCT: 42.5 % (ref 36.0–46.0)
Hemoglobin: 14.3 g/dL (ref 12.0–15.0)
Lymphocytes Relative: 16.7 % (ref 12.0–46.0)
Lymphs Abs: 1 10*3/uL (ref 0.7–4.0)
MCHC: 33.7 g/dL (ref 30.0–36.0)
MCV: 84.6 fl (ref 78.0–100.0)
Monocytes Absolute: 0.5 10*3/uL (ref 0.1–1.0)
Monocytes Relative: 8.4 % (ref 3.0–12.0)
NEUTROS ABS: 4.3 10*3/uL (ref 1.4–7.7)
Neutrophils Relative %: 73.3 % (ref 43.0–77.0)
Platelets: 138 10*3/uL — ABNORMAL LOW (ref 150.0–400.0)
RBC: 5.03 Mil/uL (ref 3.87–5.11)
RDW: 13.6 % (ref 11.5–15.5)
WBC: 5.9 10*3/uL (ref 4.0–10.5)

## 2018-12-30 LAB — LDL CHOLESTEROL, DIRECT: Direct LDL: 155 mg/dL

## 2018-12-30 LAB — LIPID PANEL
Cholesterol: 227 mg/dL — ABNORMAL HIGH (ref 0–200)
HDL: 44.2 mg/dL (ref >39.00)
NonHDL: 182.45
Total CHOL/HDL Ratio: 5
Triglycerides: 238 mg/dL — ABNORMAL HIGH (ref 0.0–149.0)
VLDL: 47.6 mg/dL — ABNORMAL HIGH (ref 0.0–40.0)

## 2018-12-30 MED ORDER — CLOTRIMAZOLE-BETAMETHASONE 1-0.05 % EX CREA: 1.0000 "application " | TOPICAL_CREAM | Freq: Two times a day (BID) | CUTANEOUS | 0 refills | Status: DC

## 2018-12-30 MED ORDER — IOPAMIDOL (ISOVUE-300) INJECTION 61%
100.0000 mL | Freq: Once | INTRAVENOUS | Status: AC | PRN
  Administered 2018-12-30: 100 mL via INTRAVENOUS

## 2018-12-30 MED ORDER — TAMOXIFEN CITRATE 10 MG PO TABS: 5.0000 mg | ORAL_TABLET | Freq: Two times a day (BID) | ORAL | 1 refills | Status: DC

## 2018-12-30 NOTE — Assessment & Plan Note (Signed)
09/21/2018 palpable right breast mass at 12:30 position 2.4 x 0.8 x 3 cm lipoma; screening detected right breast calcifications 1.3 cm linear orientation right breast upper outer quadrant; biopsy: DCIS high-grade, comedo pattern ER 10%, PR 0%, Tis NX stage 0  10/22/18: Rt Lumpectomy: DCIS IG to HG ER 10% PR 0%, TisNx Stage 0 Adjuvant radiation therapy 11/27/2018-12/23/2018  Treatment plan: Tamoxifen 5 mg daily because of serious concerns regarding stroke based on TAM-01 clinical trial data that showed benefit even at 5 mg dose.  Return to clinic in 3 months for survivorship care plan visit

## 2018-12-30 NOTE — Assessment & Plan Note (Signed)
Has made lifestyle changes and has lost weight Would like lipids checked since getting labs

## 2018-12-30 NOTE — Patient Instructions (Signed)
  Tests ordered today. Your results will be released to [facility] (or called to you) after review, usually within 72hours after test completion. If any changes need to be made, you will be notified at that same time.   Medications reviewed and updated.  Changes include :   lotrisone cream for the rash  Your prescription(s) have been submitted to your pharmacy. Please take as directed and contact our office if you believe you are having problem(s) with the medication(s).  A ct scan was ordered - we will call you with the results.

## 2018-12-30 NOTE — Assessment & Plan Note (Signed)
Not controlled, possible gastritis Increase lansoprazole to twice daily Contact GI to schedule EGD

## 2018-12-30 NOTE — Assessment & Plan Note (Addendum)
LLQ pain and tenderness on exam, some LUQ tenderness as well - ? Diverticulitis Had a couple of episodes of loose stool and vomiting CT Ab/pelvis Cbc, cmp, UA, UCx We will contact GI to schedule endoscopy and colonoscopy Seems less likely to be related to her ovary, but this area will be seen on CT scan as well

## 2018-12-30 NOTE — Assessment & Plan Note (Signed)
Right breast - just finished radiation - put a kleenex in her bra due to nipple irration from the bra and developed a rash with very defined borders related to the kleenex - mild itchiness, irritation, mild warmth, no pain,  No change in rash x 4-5 days lotrisone cream BID

## 2018-12-30 NOTE — Telephone Encounter (Signed)
Gave avs and calendar ° °

## 2018-12-30 NOTE — Assessment & Plan Note (Signed)
UTI vs related to inflammation in bowel or organ prolpase UA, UCx Cbc, cmp

## 2018-12-31 ENCOUNTER — Encounter: Payer: Self-pay | Admitting: Internal Medicine

## 2018-12-31 ENCOUNTER — Other Ambulatory Visit: Payer: Self-pay | Admitting: Internal Medicine

## 2018-12-31 ENCOUNTER — Telehealth: Payer: Self-pay | Admitting: Internal Medicine

## 2018-12-31 DIAGNOSIS — R188 Other ascites: Secondary | ICD-10-CM

## 2018-12-31 LAB — URINE CULTURE
MICRO NUMBER:: 185907
SPECIMEN QUALITY:: ADEQUATE

## 2018-12-31 NOTE — Telephone Encounter (Signed)
Copied from [facility] [phone] [phone]. Topic: Quick Communication - See Telephone Encounter >> Dec 31, 2018  9:05 AM Bea Graff, NT wrote: CRM for notification. See Telephone encounter for: 12/31/18. Pt states she received her CT result on My Chart but would like a call to discuss and explain what the results mean. Please advise.

## 2018-12-31 NOTE — Telephone Encounter (Signed)
Pt aware of results 

## 2019-01-02 ENCOUNTER — Encounter: Payer: Self-pay | Admitting: Internal Medicine

## 2019-01-04 NOTE — Telephone Encounter (Signed)
He should probably see derm - can you let her know.  Thanks.

## 2019-01-05 ENCOUNTER — Ambulatory Visit
Admission: RE | Admit: 2019-01-05 | Discharge: 2019-01-05 | Disposition: A | Discharge status: Home / Self Care | Payer: BLUE CROSS/BLUE SHIELD | Source: Ambulatory Visit | Attending: Internal Medicine | Admitting: Internal Medicine

## 2019-01-05 DIAGNOSIS — R188 Other ascites: Secondary | ICD-10-CM

## 2019-01-05 DIAGNOSIS — D259 Leiomyoma of uterus, unspecified: Secondary | ICD-10-CM | POA: Diagnosis not present

## 2019-01-06 NOTE — Telephone Encounter (Signed)
Please advise on my chart response.

## 2019-01-09 NOTE — Telephone Encounter (Signed)
I really do not recall what his bumps looked like.  The retinoids are more for acne.  The azelaic acid is typically used for rosacea.  Without remembering exactly what the bumps looked like it is hard to know what is going to work.     If he wants to see derm he can consider [facility] or Dr Nevada Crane - both are easier to get in with

## 2019-01-11 ENCOUNTER — Encounter: Payer: Self-pay | Admitting: Gastroenterology

## 2019-01-18 ENCOUNTER — Encounter: Payer: Self-pay | Admitting: Gastroenterology

## 2019-01-18 ENCOUNTER — Other Ambulatory Visit: Payer: Self-pay

## 2019-01-18 ENCOUNTER — Ambulatory Visit (AMBULATORY_SURGERY_CENTER): Payer: Self-pay | Admitting: *Deleted

## 2019-01-18 VITALS — Ht 64.0 in | Wt 145.0 lb

## 2019-01-18 DIAGNOSIS — Z1211 Encounter for screening for malignant neoplasm of colon: Secondary | ICD-10-CM

## 2019-01-18 DIAGNOSIS — R112 Nausea with vomiting, unspecified: Secondary | ICD-10-CM

## 2019-01-18 MED ORDER — PEG 3350-KCL-NA BICARB-NACL 420 G PO SOLR: 4000.0000 mL | Freq: Once | ORAL | 0 refills | Status: AC

## 2019-01-18 NOTE — Progress Notes (Signed)
No egg or soy allergy known to patient  No issues with past sedation with any surgeries  or procedures, no intubation problems  No diet pills per patient No home 02 use per patient  No blood thinners per patient  Pt denies issues with constipation  No A fib or A flutter  EMMI video offered and declined by the patient. Paper brochures given

## 2019-01-21 DIAGNOSIS — G518 Other disorders of facial nerve: Secondary | ICD-10-CM | POA: Diagnosis not present

## 2019-01-21 DIAGNOSIS — M791 Myalgia, unspecified site: Secondary | ICD-10-CM | POA: Diagnosis not present

## 2019-01-21 DIAGNOSIS — M542 Cervicalgia: Secondary | ICD-10-CM | POA: Diagnosis not present

## 2019-01-21 DIAGNOSIS — G43719 Chronic migraine without aura, intractable, without status migrainosus: Secondary | ICD-10-CM | POA: Diagnosis not present

## 2019-01-22 ENCOUNTER — Telehealth: Payer: Self-pay | Admitting: Gastroenterology

## 2019-01-22 NOTE — Telephone Encounter (Signed)
Explained to patient that we do not tell patient's to stay off NSAIDS prior to procedures but if her dr said that then she should follow her dr's recommendations. Patient denies taking "blood thinners".

## 2019-01-22 NOTE — Telephone Encounter (Signed)
Pt is sched for EGD col 3.16.20.  Pt reported that she "knows she is to go off of BT 10 days prior, but she hurt her back and needs to take Aleve."  Please advise.

## 2019-01-25 ENCOUNTER — Ambulatory Visit: Payer: BLUE CROSS/BLUE SHIELD | Admitting: Radiation Oncology

## 2019-01-25 DIAGNOSIS — G4733 Obstructive sleep apnea (adult) (pediatric): Secondary | ICD-10-CM | POA: Diagnosis not present

## 2019-01-27 DIAGNOSIS — M542 Cervicalgia: Secondary | ICD-10-CM | POA: Diagnosis not present

## 2019-01-27 DIAGNOSIS — M791 Myalgia, unspecified site: Secondary | ICD-10-CM | POA: Diagnosis not present

## 2019-01-27 DIAGNOSIS — G518 Other disorders of facial nerve: Secondary | ICD-10-CM | POA: Diagnosis not present

## 2019-01-27 DIAGNOSIS — G43719 Chronic migraine without aura, intractable, without status migrainosus: Secondary | ICD-10-CM | POA: Diagnosis not present

## 2019-01-28 ENCOUNTER — Other Ambulatory Visit: Payer: Self-pay | Admitting: Internal Medicine

## 2019-01-29 ENCOUNTER — Telehealth: Payer: Self-pay | Admitting: Gastroenterology

## 2019-01-29 ENCOUNTER — Other Ambulatory Visit: Payer: Self-pay | Admitting: Internal Medicine

## 2019-01-29 NOTE — Telephone Encounter (Signed)
Hi Dr. Ardis Hughs, this patient just cancelled her colonoscopy that was scheduled on 3/16 with you, she stated that she underwent treatment for breast cancer not long ago and with the latest news regarding the coronavirus she prefers to postpone her procedure so her immune system is not compromised. She will call back to reschedule. Thank you.

## 2019-01-29 NOTE — Telephone Encounter (Signed)
Tell her I understand.  No charge for the cancellation

## 2019-02-01 ENCOUNTER — Encounter: Payer: BLUE CROSS/BLUE SHIELD | Admitting: Gastroenterology

## 2019-02-25 DIAGNOSIS — G4733 Obstructive sleep apnea (adult) (pediatric): Secondary | ICD-10-CM | POA: Diagnosis not present

## 2019-03-27 DIAGNOSIS — G4733 Obstructive sleep apnea (adult) (pediatric): Secondary | ICD-10-CM | POA: Diagnosis not present

## 2019-03-31 DIAGNOSIS — M542 Cervicalgia: Secondary | ICD-10-CM | POA: Diagnosis not present

## 2019-03-31 DIAGNOSIS — G518 Other disorders of facial nerve: Secondary | ICD-10-CM | POA: Diagnosis not present

## 2019-03-31 DIAGNOSIS — G43719 Chronic migraine without aura, intractable, without status migrainosus: Secondary | ICD-10-CM | POA: Diagnosis not present

## 2019-03-31 DIAGNOSIS — M791 Myalgia, unspecified site: Secondary | ICD-10-CM | POA: Diagnosis not present

## 2019-04-09 ENCOUNTER — Telehealth: Payer: Self-pay | Admitting: Adult Health

## 2019-04-09 NOTE — Telephone Encounter (Signed)
Rescheduled appt per sch msg. Called and left msg for patient

## 2019-04-19 DIAGNOSIS — G43719 Chronic migraine without aura, intractable, without status migrainosus: Secondary | ICD-10-CM | POA: Diagnosis not present

## 2019-04-19 DIAGNOSIS — M542 Cervicalgia: Secondary | ICD-10-CM | POA: Diagnosis not present

## 2019-04-26 ENCOUNTER — Other Ambulatory Visit: Payer: Self-pay | Admitting: Internal Medicine

## 2019-04-26 ENCOUNTER — Telehealth: Payer: Self-pay | Admitting: Adult Health

## 2019-04-26 NOTE — Telephone Encounter (Signed)
Called patient regarding upcoming [facility] appointment, per patient's request this appointment has been cancelled. Patient will call back when ready to reschedule. °

## 2019-04-28 ENCOUNTER — Ambulatory Visit: Payer: BLUE CROSS/BLUE SHIELD | Admitting: Adult Health

## 2019-04-28 DIAGNOSIS — M542 Cervicalgia: Secondary | ICD-10-CM | POA: Diagnosis not present

## 2019-04-28 DIAGNOSIS — G43719 Chronic migraine without aura, intractable, without status migrainosus: Secondary | ICD-10-CM | POA: Diagnosis not present

## 2019-04-29 ENCOUNTER — Encounter: Payer: BLUE CROSS/BLUE SHIELD | Admitting: Adult Health

## 2019-04-29 DIAGNOSIS — H61002 Unspecified perichondritis of left external ear: Secondary | ICD-10-CM | POA: Diagnosis not present

## 2019-04-29 DIAGNOSIS — D485 Neoplasm of uncertain behavior of skin: Secondary | ICD-10-CM | POA: Diagnosis not present

## 2019-05-03 ENCOUNTER — Ambulatory Visit
Admission: RE | Admit: 2019-05-03 | Discharge: 2019-05-03 | Disposition: A | Discharge status: Home / Self Care | Payer: Self-pay | Source: Ambulatory Visit | Attending: Radiation Oncology | Admitting: Radiation Oncology

## 2019-05-03 ENCOUNTER — Telehealth: Payer: Self-pay | Admitting: *Deleted

## 2019-05-03 NOTE — Telephone Encounter (Signed)
The pt called said she had a migraine, would call back later

## 2019-05-22 ENCOUNTER — Other Ambulatory Visit: Payer: Self-pay | Admitting: Internal Medicine

## 2019-06-18 ENCOUNTER — Other Ambulatory Visit: Payer: Self-pay | Admitting: Internal Medicine

## 2019-06-28 ENCOUNTER — Other Ambulatory Visit: Payer: Self-pay

## 2019-06-28 DIAGNOSIS — Z20822 Contact with and (suspected) exposure to covid-19: Secondary | ICD-10-CM

## 2019-06-29 ENCOUNTER — Other Ambulatory Visit: Payer: Self-pay | Admitting: Hematology and Oncology

## 2019-06-29 LAB — NOVEL CORONAVIRUS, NAA: SARS-CoV-2, NAA: NOT DETECTED

## 2019-07-03 ENCOUNTER — Encounter: Payer: Self-pay | Admitting: Internal Medicine

## 2019-07-06 DIAGNOSIS — G43719 Chronic migraine without aura, intractable, without status migrainosus: Secondary | ICD-10-CM | POA: Diagnosis not present

## 2019-07-06 DIAGNOSIS — M542 Cervicalgia: Secondary | ICD-10-CM | POA: Diagnosis not present

## 2019-07-07 NOTE — Progress Notes (Deleted)
Virtual Visit via Video Note  I connected with Adelaide Pfefferkorn Petrov on 07/07/19 at  9:45 AM EDT by a video enabled telemedicine application and verified that I am speaking with the correct person using two identifiers.   I discussed the limitations of evaluation and management by telemedicine and the availability of in person appointments. The patient expressed understanding and agreed to proceed.  The patient is currently at home and I am in the office.    No referring provider.    History of Present Illness: This visit is an acute visit for cold symptoms.   Her symptoms started   She is experiencing   She has tried taking     Social History   Socioeconomic History  . Marital status: Legally Separated    Spouse name: Not on file  . Number of children: 1  . Years of education: Not on file  . Highest education level: Not on file  Occupational History  . Occupation: retired  Scientific laboratory technician  . Financial resource strain: Not on file  . Food insecurity    Worry: Not on file    Inability: Not on file  . Transportation needs    Medical: Not on file    Non-medical: Not on file  Tobacco Use  . Smoking status: Never Smoker  . Smokeless tobacco: Never Used  Substance and Sexual Activity  . Alcohol use: Yes    Comment: rarely  . Drug use: No  . Sexual activity: Never  Lifestyle  . Physical activity    Days per week: Not on file    Minutes per session: Not on file  . Stress: Not on file  Relationships  . Social Herbalist on phone: Not on file    Gets together: Not on file    Attends religious service: Not on file    Active member of club or organization: Not on file    Attends meetings of clubs or organizations: Not on file    Relationship status: Not on file  Other Topics Concern  . Not on file  Social History Narrative   No regular exercise     Observations/Objective: Appears well in NAD   Assessment and Plan:  See Problem List for Assessment and  Plan of chronic medical problems.   Follow Up Instructions:    I discussed the assessment and treatment plan with the patient. The patient was provided an opportunity to ask questions and all were answered. The patient agreed with the plan and demonstrated an understanding of the instructions.   The patient was advised to call back or seek an in-person evaluation if the symptoms worsen or if the condition fails to improve as anticipated.    Binnie Rail, MD

## 2019-07-08 ENCOUNTER — Ambulatory Visit: Payer: Self-pay | Admitting: Internal Medicine

## 2019-07-09 ENCOUNTER — Encounter: Payer: Self-pay | Admitting: Internal Medicine

## 2019-07-09 ENCOUNTER — Ambulatory Visit (INDEPENDENT_AMBULATORY_CARE_PROVIDER_SITE_OTHER): Payer: BC Managed Care – PPO | Admitting: Internal Medicine

## 2019-07-09 DIAGNOSIS — G479 Sleep disorder, unspecified: Secondary | ICD-10-CM

## 2019-07-09 DIAGNOSIS — J209 Acute bronchitis, unspecified: Secondary | ICD-10-CM | POA: Diagnosis not present

## 2019-07-09 DIAGNOSIS — R110 Nausea: Secondary | ICD-10-CM

## 2019-07-09 MED ORDER — PROMETHAZINE-DM 6.25-15 MG/5ML PO SYRP: 5.0000 mL | ORAL_SOLUTION | Freq: Four times a day (QID) | ORAL | 0 refills | Status: DC | PRN

## 2019-07-09 MED ORDER — DOXYCYCLINE HYCLATE 100 MG PO TABS: 100.0000 mg | ORAL_TABLET | Freq: Two times a day (BID) | ORAL | 0 refills | Status: DC

## 2019-07-09 MED ORDER — ONDANSETRON 4 MG PO TBDP: 4.0000 mg | ORAL_TABLET | Freq: Three times a day (TID) | ORAL | 0 refills | Status: DC | PRN

## 2019-07-09 NOTE — Assessment & Plan Note (Signed)
Symptoms consistent with bronchitis, likely bacterial in nature given severity and duration of illness Doxycycline twice daily x10 days Promethazine-dm cough syrup Rest, fluids Call if no improvement

## 2019-07-09 NOTE — Assessment & Plan Note (Signed)
zofran prn

## 2019-07-09 NOTE — Progress Notes (Signed)
Virtual Visit via Video Note  I connected with Tracie Hart on 07/09/19 at 10:45 AM EDT by a video enabled telemedicine application and verified that I am speaking with the correct person using two identifiers.   I discussed the limitations of evaluation and management by telemedicine and the availability of in person appointments. The patient expressed understanding and agreed to proceed.  The patient is currently at home and I am in the office.    No referring provider.    History of Present Illness: This visit is an acute visit for cold symptoms.   Her symptoms started 2 weeks ago.  She still does not feel well and does not feel like she is getting better.  She is experiencing fatigue, low-grade fever, nasal congestion, mild sore throat, fullness in her ears, productive cough of light-colored sputum, some tightness in the chest, nausea, vomiting that is minimal, body aches and headaches.  She just does not feel well.  She denies any sinus pain, shortness of breath or wheezing.  She has not had any diarrhea.  There is been no loss of taste.  She has had a negative COVID test.  She has tried taking aleve, cough suppressant.   It can take her three hours to fall asleep.  This started prior to this illness.  There has been no increase in stress.  She was unsure what to do.  She did not want to be on any medication that was potentially addicting or that she would be dependent on.  Years ago she has tried over-the-counter stuff, but it caused a flare of her headaches.   Review of Systems  Constitutional: Positive for malaise/fatigue. Negative for fever (low grade).  HENT: Positive for congestion and sore throat (mild). Negative for ear pain (ear feel full) and sinus pain.        No loss of taste or smell  Respiratory: Positive for cough and sputum production. Negative for shortness of breath and wheezing.        Some tightness in chest  Cardiovascular: Negative for chest pain.   Gastrointestinal: Positive for nausea and vomiting (minimal). Negative for diarrhea.  Musculoskeletal: Positive for myalgias.  Neurological: Positive for headaches. Negative for dizziness.    Social History   Socioeconomic History  . Marital status: Legally Separated    Spouse name: Not on file  . Number of children: 1  . Years of education: Not on file  . Highest education level: Not on file  Occupational History  . Occupation: retired  Scientific laboratory technician  . Financial resource strain: Not on file  . Food insecurity    Worry: Not on file    Inability: Not on file  . Transportation needs    Medical: Not on file    Non-medical: Not on file  Tobacco Use  . Smoking status: Never Smoker  . Smokeless tobacco: Never Used  Substance and Sexual Activity  . Alcohol use: Yes    Comment: rarely  . Drug use: No  . Sexual activity: Never  Lifestyle  . Physical activity    Days per week: Not on file    Minutes per session: Not on file  . Stress: Not on file  Relationships  . Social Herbalist on phone: Not on file    Gets together: Not on file    Attends religious service: Not on file    Active member of club or organization: Not on file    Attends meetings of  clubs or organizations: Not on file    Relationship status: Not on file  Other Topics Concern  . Not on file  Social History Narrative   No regular exercise     Observations/Objective: Appears well in NAD Breathing normally, coughing several times throughout visit.  Assessment and Plan:  See Problem List for Assessment and Plan of chronic medical problems.   Follow Up Instructions:    I discussed the assessment and treatment plan with the patient. The patient was provided an opportunity to ask questions and all were answered. The patient agreed with the plan and demonstrated an understanding of the instructions.   The patient was advised to call back or seek an in-person evaluation if the symptoms worsen or  if the condition fails to improve as anticipated.    Binnie Rail, MD

## 2019-07-09 NOTE — Assessment & Plan Note (Signed)
Will try otc medication - melatonin, sleepy time tea

## 2019-07-26 ENCOUNTER — Other Ambulatory Visit: Payer: Self-pay | Admitting: Internal Medicine

## 2019-07-28 DIAGNOSIS — G43719 Chronic migraine without aura, intractable, without status migrainosus: Secondary | ICD-10-CM | POA: Diagnosis not present

## 2019-07-28 DIAGNOSIS — M542 Cervicalgia: Secondary | ICD-10-CM | POA: Diagnosis not present

## 2019-07-29 ENCOUNTER — Other Ambulatory Visit: Payer: Self-pay | Admitting: Internal Medicine

## 2019-08-25 ENCOUNTER — Other Ambulatory Visit: Payer: Self-pay | Admitting: Internal Medicine

## 2019-09-03 DIAGNOSIS — Z01419 Encounter for gynecological examination (general) (routine) without abnormal findings: Secondary | ICD-10-CM | POA: Diagnosis not present

## 2019-09-03 DIAGNOSIS — Z6824 Body mass index (BMI) 24.0-24.9, adult: Secondary | ICD-10-CM | POA: Diagnosis not present

## 2019-09-03 DIAGNOSIS — R102 Pelvic and perineal pain: Secondary | ICD-10-CM | POA: Diagnosis not present

## 2019-09-08 DIAGNOSIS — L570 Actinic keratosis: Secondary | ICD-10-CM | POA: Diagnosis not present

## 2019-09-08 DIAGNOSIS — H61002 Unspecified perichondritis of left external ear: Secondary | ICD-10-CM | POA: Diagnosis not present

## 2019-09-08 DIAGNOSIS — L820 Inflamed seborrheic keratosis: Secondary | ICD-10-CM | POA: Diagnosis not present

## 2019-09-15 ENCOUNTER — Other Ambulatory Visit: Payer: Self-pay | Admitting: Internal Medicine

## 2019-09-15 DIAGNOSIS — Z853 Personal history of malignant neoplasm of breast: Secondary | ICD-10-CM

## 2019-09-15 DIAGNOSIS — M542 Cervicalgia: Secondary | ICD-10-CM | POA: Diagnosis not present

## 2019-09-15 DIAGNOSIS — Z9889 Other specified postprocedural states: Secondary | ICD-10-CM

## 2019-09-15 DIAGNOSIS — G43719 Chronic migraine without aura, intractable, without status migrainosus: Secondary | ICD-10-CM | POA: Diagnosis not present

## 2019-09-29 ENCOUNTER — Other Ambulatory Visit: Payer: Self-pay | Admitting: Internal Medicine

## 2019-10-04 IMAGING — CT CT ABD-PELV W/ CM
1 of 3 series · 13 of 32 positions shown, 18 images · IV contrast (APPLIED)
Comparison: None.

CLINICAL DATA: Abdominal pain, diverticulitis

EXAM:
CT ABDOMEN AND PELVIS WITH CONTRAST
TECHNIQUE: Multidetector CT imaging of the abdomen and pelvis was performed
using the standard protocol following bolus administration of
intravenous contrast.
CONTRAST:  100mL [id number] IOPAMIDOL ([id number]) INJECTION 61%

[Series 2: abd/pelvis w/cm · axial · 0.62mm/px · z∈[-487,-117]mm · 13 of 86 slices shown, 18 images]
[im 6/86  soft-tissue]
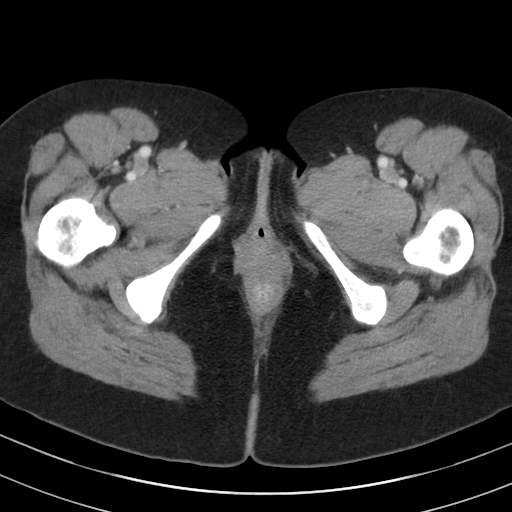
[im 6/86  bone]
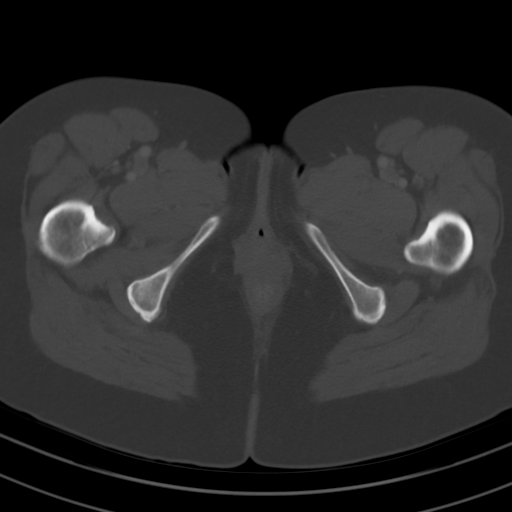
[im 12/86  soft-tissue]
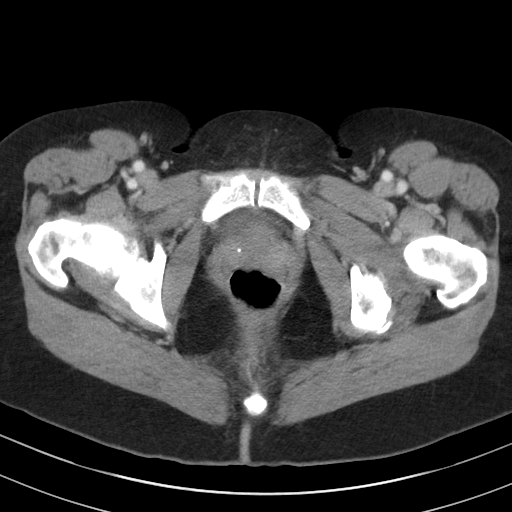
[im 18/86  soft-tissue]
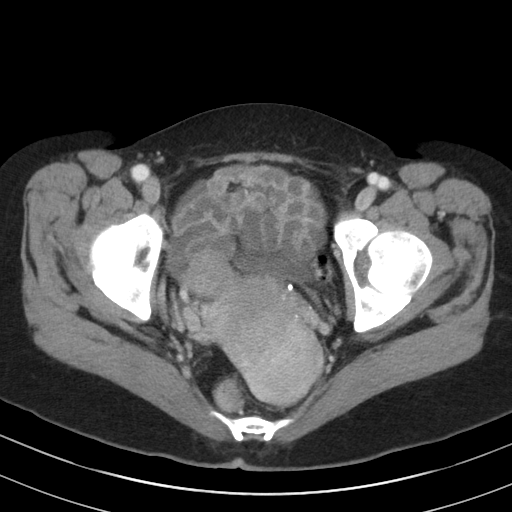
[im 29/86  soft-tissue]
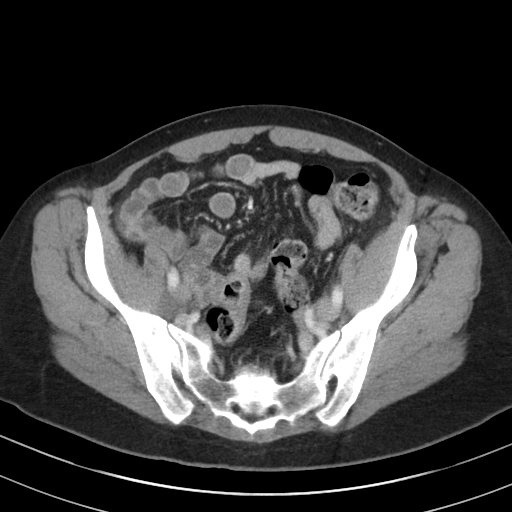
[im 35/86  soft-tissue]
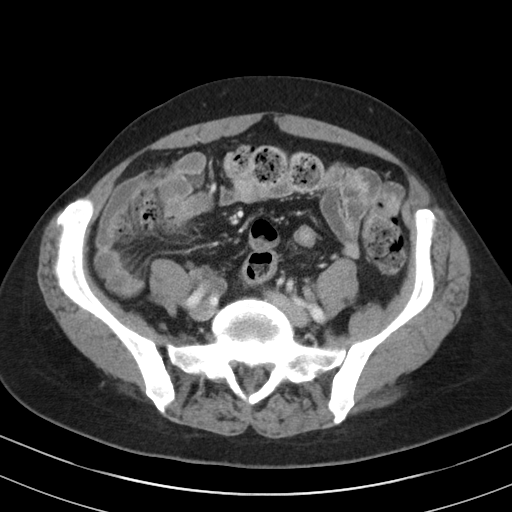
[im 40/86  soft-tissue]
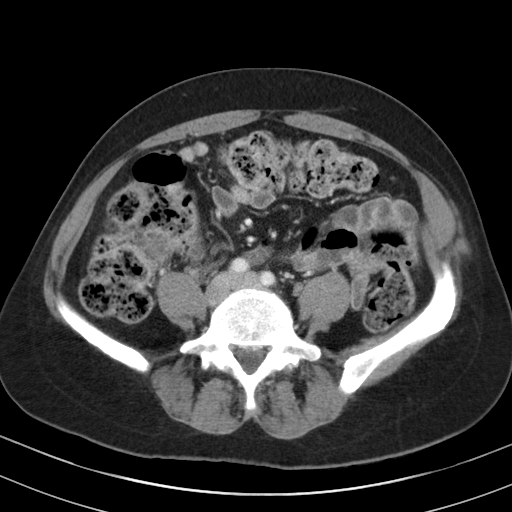
[im 46/86  soft-tissue]
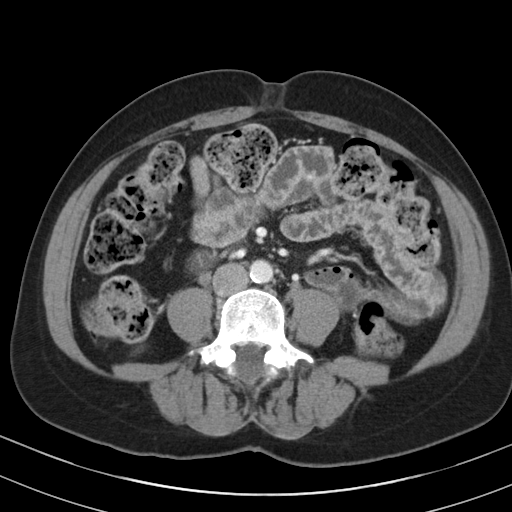
[im 52/86  soft-tissue]
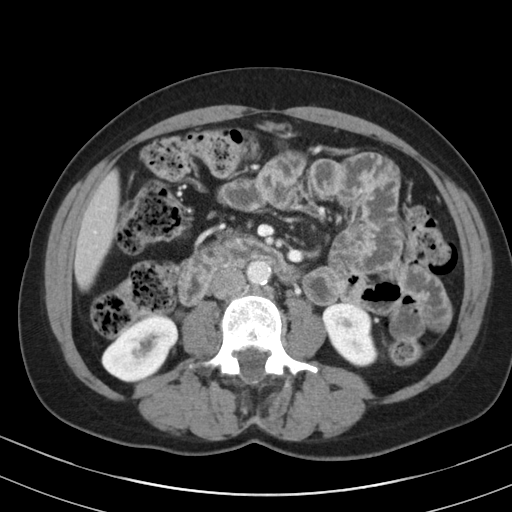
[im 57/86  soft-tissue]
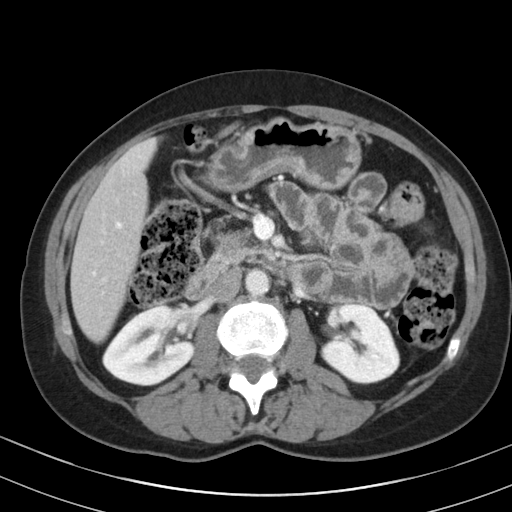
[im 57/86  bone]
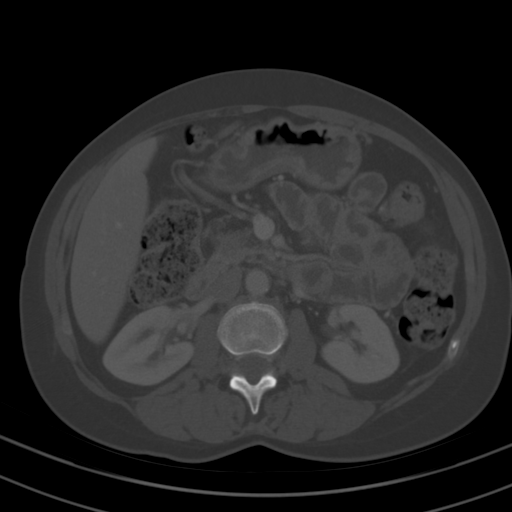
[im 63/86  lung]
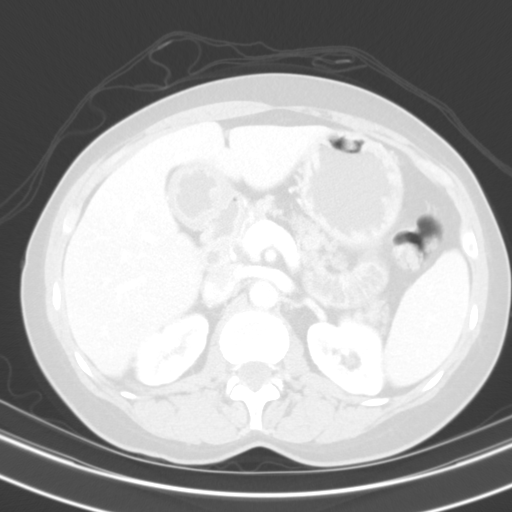
[im 69/86  soft-tissue]
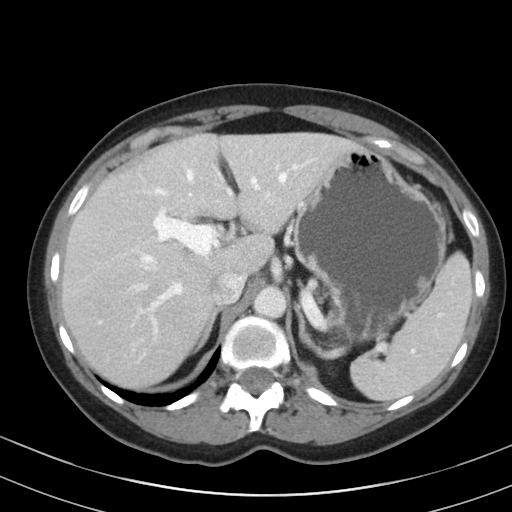
[im 69/86  lung]
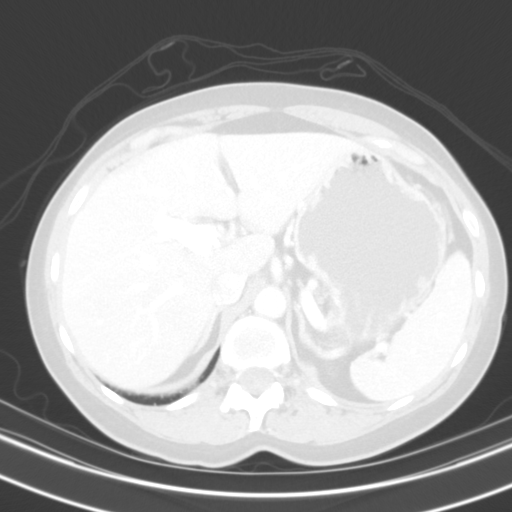
[im 74/86  soft-tissue]
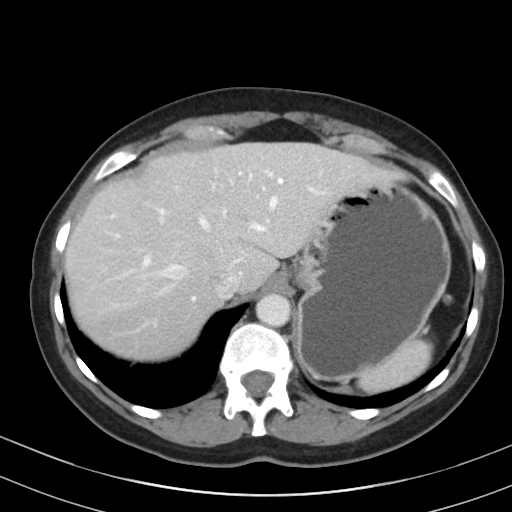
[im 74/86  lung]
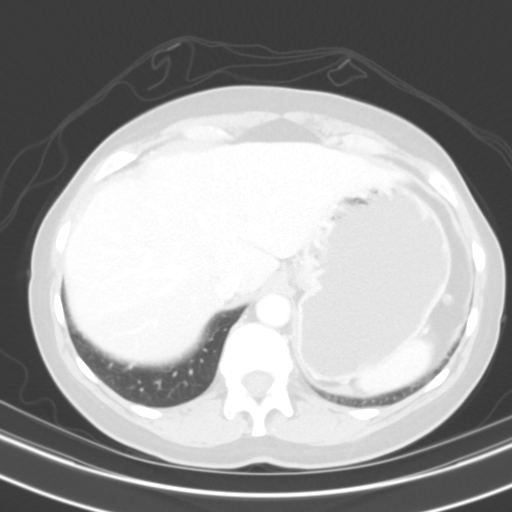
[im 80/86  soft-tissue]
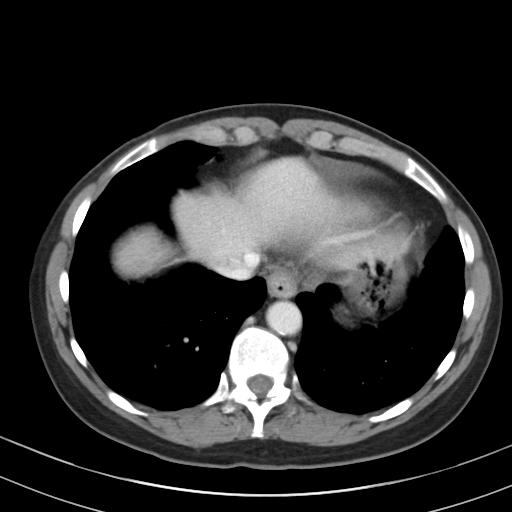
[im 80/86  lung]
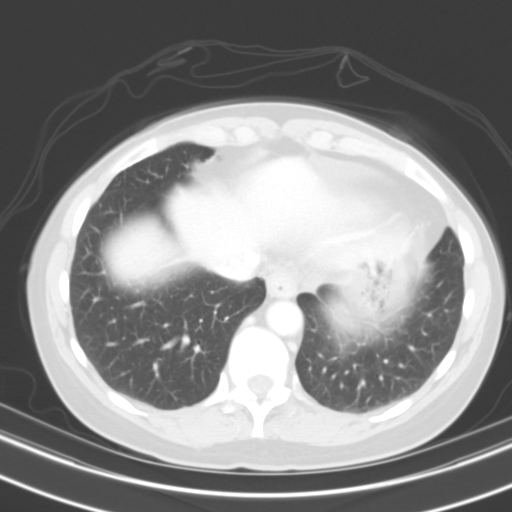

[13 of 32 positions shown; findings below may reference images not displayed]

FINDINGS: Lower chest: No acute abnormality.

Hepatobiliary: No focal liver abnormality is seen. No gallstones,
gallbladder wall thickening, or biliary dilatation.

Pancreas: Unremarkable. No pancreatic ductal dilatation or
surrounding inflammatory changes.

Spleen: Normal in size without focal abnormality.

Adrenals/Urinary Tract: Adrenal glands are unremarkable. Kidneys are
normal, without renal calculi, focal lesion, or hydronephrosis.
Bladder is unremarkable.

Stomach/Bowel: Stomach is within normal limits. Appendix appears
normal. No evidence of bowel wall thickening, distention, or
inflammatory changes. No significant diverticular disease. Large
burden of stool in the colon.

Vascular/Lymphatic: No significant vascular findings are present. No
enlarged abdominal or pelvic lymph nodes.

Reproductive: There is a large bulky mass, which appears to arise
from the posterior lower uterine segment (series 2, image 68, series
5, image 50) and likely reflects one or more fibroids. There are
multiple additional smaller fibroids of the uterine body. Fluid
attenuation lesion in the vicinity of the right ovary measuring at
least 5.2 x 3.0 cm (series 2, image 62, series 4, image 49).

Other: No abdominal wall hernia or abnormality. No abdominopelvic
ascites.

Musculoskeletal: No acute or significant osseous findings.
IMPRESSION: 1. No definite acute CT findings of the abdomen or pelvis to explain
lower abdominal pain. No significant diverticular disease and no
evidence of diverticulitis.

2.  Large burden of stool in the colon.

3. There is a large bulky mass, which appears to arise from the
posterior lower uterine segment (series 2, image 68, series 5, image
50) and likely reflects one or more fibroids. There are multiple
additional smaller fibroids of the uterine body.

4. Fluid attenuation lesion in the vicinity of the right ovary
measuring at least 5.2 x 3.0 cm (series 2, image 62, series 4, image
49). Recommend dedicated pelvic ultrasound to further characterize
in the postmenopausal setting.

## 2019-10-05 ENCOUNTER — Ambulatory Visit
Admission: RE | Admit: 2019-10-05 | Discharge: 2019-10-05 | Disposition: A | Discharge status: Home / Self Care | Payer: BC Managed Care – PPO | Source: Ambulatory Visit | Attending: Internal Medicine | Admitting: Internal Medicine

## 2019-10-05 ENCOUNTER — Other Ambulatory Visit: Payer: Self-pay

## 2019-10-05 DIAGNOSIS — Z853 Personal history of malignant neoplasm of breast: Secondary | ICD-10-CM

## 2019-10-05 DIAGNOSIS — R922 Inconclusive mammogram: Secondary | ICD-10-CM | POA: Diagnosis not present

## 2019-10-05 DIAGNOSIS — Z9889 Other specified postprocedural states: Secondary | ICD-10-CM

## 2019-10-07 ENCOUNTER — Encounter: Payer: Self-pay | Admitting: Internal Medicine

## 2019-10-21 DIAGNOSIS — N8182 Incompetence or weakening of pubocervical tissue: Secondary | ICD-10-CM | POA: Diagnosis not present

## 2019-10-21 DIAGNOSIS — Z112 Encounter for screening for other bacterial diseases: Secondary | ICD-10-CM | POA: Diagnosis not present

## 2019-10-22 ENCOUNTER — Other Ambulatory Visit: Payer: Self-pay | Admitting: *Deleted

## 2019-10-22 MED ORDER — LEVOTHYROXINE SODIUM 50 MCG PO TABS: 50.0000 ug | ORAL_TABLET | Freq: Every day | ORAL | 0 refills | Status: DC

## 2019-10-22 MED ORDER — ESCITALOPRAM OXALATE 20 MG PO TABS: 20.0000 mg | ORAL_TABLET | Freq: Every day | ORAL | 0 refills | Status: DC

## 2019-10-22 MED ORDER — ESCITALOPRAM OXALATE 10 MG PO TABS: 10.0000 mg | ORAL_TABLET | Freq: Every day | ORAL | 0 refills | Status: DC

## 2019-10-26 DIAGNOSIS — M542 Cervicalgia: Secondary | ICD-10-CM | POA: Diagnosis not present

## 2019-10-26 DIAGNOSIS — G43719 Chronic migraine without aura, intractable, without status migrainosus: Secondary | ICD-10-CM | POA: Diagnosis not present

## 2019-10-27 DIAGNOSIS — G43719 Chronic migraine without aura, intractable, without status migrainosus: Secondary | ICD-10-CM | POA: Diagnosis not present

## 2019-10-27 DIAGNOSIS — M542 Cervicalgia: Secondary | ICD-10-CM | POA: Diagnosis not present

## 2019-11-10 DIAGNOSIS — Z20828 Contact with and (suspected) exposure to other viral communicable diseases: Secondary | ICD-10-CM | POA: Diagnosis not present

## 2019-11-19 DIAGNOSIS — G43719 Chronic migraine without aura, intractable, without status migrainosus: Secondary | ICD-10-CM | POA: Diagnosis not present

## 2019-11-20 ENCOUNTER — Other Ambulatory Visit: Payer: Self-pay | Admitting: Internal Medicine

## 2019-11-23 ENCOUNTER — Encounter: Payer: Self-pay | Admitting: Internal Medicine

## 2019-11-24 ENCOUNTER — Other Ambulatory Visit: Payer: Self-pay

## 2019-11-24 MED ORDER — ESCITALOPRAM OXALATE 10 MG PO TABS: 10.0000 mg | ORAL_TABLET | Freq: Every day | ORAL | 0 refills | Status: DC

## 2019-11-24 MED ORDER — LEVOTHYROXINE SODIUM 50 MCG PO TABS: 50.0000 ug | ORAL_TABLET | Freq: Every day | ORAL | 0 refills | Status: DC

## 2019-11-24 MED ORDER — ESCITALOPRAM OXALATE 20 MG PO TABS: 20.0000 mg | ORAL_TABLET | Freq: Every day | ORAL | 0 refills | Status: DC

## 2019-12-08 ENCOUNTER — Encounter: Payer: Self-pay | Admitting: Internal Medicine

## 2019-12-09 NOTE — Progress Notes (Deleted)
Virtual Visit via Video Note  I connected with Tracie Hart on 12/09/19 at 10:45 AM EST by a video enabled telemedicine application and verified that I am speaking with the correct person using two identifiers.   I discussed the limitations of evaluation and management by telemedicine and the availability of in person appointments. The patient expressed understanding and agreed to proceed.  Present for the visit:  Myself, Dr Billey Gosling, Judge Stall.  The patient is currently at home and I am in the office.    No referring provider.    History of Present Illness: She is here for follow up of her chronic medical conditions.   She is taking all of her medications as prescribed.   She is exercising regularly.    Migraine headaches:  Hypothyroidism:  She is taking her medication daily.  She denies any recent changes in energy or weight that are unexplained.   GERD:  She is taking her medication daily as prescribed.  She denies any GERD symptoms and feels her GERD is well controlled.   Depression: She is taking her medication daily as prescribed. She denies any side effects from the medication. She feels her depression is well controlled and she is happy with her current dose of medication.   Hyperlipidemia:  She is not on any medication.  She is exercising.  She is eating a healthy diet.   ROS     Social History   Socioeconomic History  . Marital status: Legally Separated    Spouse name: Not on file  . Number of children: 1  . Years of education: Not on file  . Highest education level: Not on file  Occupational History  . Occupation: retired  Tobacco Use  . Smoking status: Never Smoker  . Smokeless tobacco: Never Used  Substance and Sexual Activity  . Alcohol use: Yes    Comment: rarely  . Drug use: No  . Sexual activity: Never  Other Topics Concern  . Not on file  Social History Narrative   No regular exercise   Social Determinants of Health   Financial  Resource Strain:   . Difficulty of Paying Living Expenses: Not on file  Food Insecurity:   . Worried About Charity fundraiser in the Last Year: Not on file  . Ran Out of Food in the Last Year: Not on file  Transportation Needs:   . Lack of Transportation (Medical): Not on file  . Lack of Transportation (Non-Medical): Not on file  Physical Activity:   . Days of Exercise per Week: Not on file  . Minutes of Exercise per Session: Not on file  Stress:   . Feeling of Stress : Not on file  Social Connections:   . Frequency of Communication with Friends and Family: Not on file  . Frequency of Social Gatherings with Friends and Family: Not on file  . Attends Religious Services: Not on file  . Active Member of Clubs or Organizations: Not on file  . Attends Archivist Meetings: Not on file  . Marital Status: Not on file     Observations/Objective: Appears well in NAD   Assessment and Plan:  See Problem List for Assessment and Plan of chronic medical problems.   Follow Up Instructions:    I discussed the assessment and treatment plan with the patient. The patient was provided an opportunity to ask questions and all were answered. The patient agreed with the plan and demonstrated an understanding of the  instructions.   The patient was advised to call back or seek an in-person evaluation if the symptoms worsen or if the condition fails to improve as anticipated.    Binnie Rail, MD

## 2019-12-10 ENCOUNTER — Ambulatory Visit: Payer: BC Managed Care – PPO | Admitting: Internal Medicine

## 2019-12-20 ENCOUNTER — Encounter: Payer: Self-pay | Admitting: Internal Medicine

## 2019-12-21 NOTE — Progress Notes (Signed)
Virtual Visit via Video Note  I connected with Tracie Hart on 12/22/19 at  1:45 PM EST by a video enabled telemedicine application and verified that I am speaking with the correct person using two identifiers.   I discussed the limitations of evaluation and management by telemedicine and the availability of in person appointments. The patient expressed understanding and agreed to proceed.  Present for the visit:  Myself, Dr Billey Gosling, Judge Stall.  The patient is currently at home and I am in the office.    No referring provider.    History of Present Illness: She is here for follow up of her chronic medical conditions.   She is taking all of her medications as prescribed, except the tamoxifen which she never started.  She also stopped the amlodipine.   She is exercising regularly - walking 3-4 miles a day.  .     Hypertension: She stopped taking the amlodipine.  She knows she should be monitoring her blood pressure but she is not.  She is exercising regularly and has lost some weight. She is compliant with a low sodium diet.     Depression: She is taking her medication daily as prescribed. She denies any side effects from the medication.  The medication does not seem to be working anymore.  She really wants to get some more help with her depression.  She is willing to change medications.  She is seeing a therapist.    Migraine headaches: [facility]  She gets botox injections.    Hypothyroidism:  She is taking her medication daily.  She denies any recent changes in energy or weight that are unexplained.   GERD:  She is taking her medication daily as prescribed.  She has daily GERD symptoms and takes Tums a few times a day.  Before the Covid pandemic she was scheduled to have a colonoscopy and endoscopy and has not rescheduled that, but knows that she needs to.   History of breast cancer: she had surgery and radiation.  She was prescribed  tamoxifen, but never started taking it.  She is not sure that she wants to take it.    Review of Systems  Constitutional: Negative for chills and fever.  Respiratory: Negative for cough, shortness of breath and wheezing.   Cardiovascular: Negative for chest pain and palpitations.  Gastrointestinal: Positive for heartburn. Negative for abdominal pain.  Neurological: Positive for headaches.     Social History   Socioeconomic History  . Marital status: Legally Separated    Spouse name: Not on file  . Number of children: 1  . Years of education: Not on file  . Highest education level: Not on file  Occupational History  . Occupation: retired  Tobacco Use  . Smoking status: Never Smoker  . Smokeless tobacco: Never Used  Substance and Sexual Activity  . Alcohol use: Yes    Comment: rarely  . Drug use: No  . Sexual activity: Never  Other Topics Concern  . Not on file  Social History Narrative   No regular exercise   Social Determinants of Health   Financial Resource Strain:   . Difficulty of Paying Living Expenses: Not on file  Food Insecurity:   . Worried About Charity fundraiser in the Last Year: Not on file  . Ran Out of Food in the Last Year: Not on file  Transportation Needs:   . Lack of Transportation (Medical): Not on file  .  Lack of Transportation (Non-Medical): Not on file  Physical Activity:   . Days of Exercise per Week: Not on file  . Minutes of Exercise per Session: Not on file  Stress:   . Feeling of Stress : Not on file  Social Connections:   . Frequency of Communication with Friends and Family: Not on file  . Frequency of Social Gatherings with Friends and Family: Not on file  . Attends Religious Services: Not on file  . Active Member of Clubs or Organizations: Not on file  . Attends Archivist Meetings: Not on file  . Marital Status: Not on file     Observations/Objective: Appears well in NAD Breathing normally Skin appears warm and  dry Mood appears normal  Assessment and Plan:  See Problem List for Assessment and Plan of chronic medical problems.   Follow Up Instructions:    I discussed the assessment and treatment plan with the patient. The patient was provided an opportunity to ask questions and all were answered. The patient agreed with the plan and demonstrated an understanding of the instructions.   The patient was advised to call back or seek an in-person evaluation if the symptoms worsen or if the condition fails to improve as anticipated.    Binnie Rail, MD

## 2019-12-22 ENCOUNTER — Encounter: Payer: Self-pay | Admitting: Internal Medicine

## 2019-12-22 ENCOUNTER — Ambulatory Visit (INDEPENDENT_AMBULATORY_CARE_PROVIDER_SITE_OTHER): Payer: BC Managed Care – PPO | Admitting: Internal Medicine

## 2019-12-22 DIAGNOSIS — G43809 Other migraine, not intractable, without status migrainosus: Secondary | ICD-10-CM

## 2019-12-22 DIAGNOSIS — Z833 Family history of diabetes mellitus: Secondary | ICD-10-CM

## 2019-12-22 DIAGNOSIS — G4733 Obstructive sleep apnea (adult) (pediatric): Secondary | ICD-10-CM

## 2019-12-22 DIAGNOSIS — E038 Other specified hypothyroidism: Secondary | ICD-10-CM

## 2019-12-22 DIAGNOSIS — E7849 Other hyperlipidemia: Secondary | ICD-10-CM

## 2019-12-22 DIAGNOSIS — R739 Hyperglycemia, unspecified: Secondary | ICD-10-CM

## 2019-12-22 DIAGNOSIS — F3289 Other specified depressive episodes: Secondary | ICD-10-CM | POA: Diagnosis not present

## 2019-12-22 DIAGNOSIS — K219 Gastro-esophageal reflux disease without esophagitis: Secondary | ICD-10-CM | POA: Diagnosis not present

## 2019-12-22 MED ORDER — PAROXETINE HCL 20 MG PO TABS: 20.0000 mg | ORAL_TABLET | Freq: Every day | ORAL | 5 refills | Status: DC

## 2019-12-22 NOTE — Assessment & Plan Note (Signed)
Chronic  Clinically euthyroid Check tsh  Titrate med dose if needed  

## 2019-12-22 NOTE — Assessment & Plan Note (Signed)
Using CPAP nightly 

## 2019-12-22 NOTE — Assessment & Plan Note (Signed)
Chronic She is exercising regularly We will check lipid panel, CMP

## 2019-12-22 NOTE — Assessment & Plan Note (Signed)
Chronic Not ideally controlled She does have a history of H. pylori Prior to the Covid pandemic she was scheduled for an endoscopy in addition to her colonoscopy-she has not rescheduled this and stressed that she should have this done She is taking Tums a few times a day Continue current medications-needs to reschedule endoscopy

## 2019-12-22 NOTE — Assessment & Plan Note (Addendum)
Chronic Not ideally controlled Has been on Lexapro 30 mg daily for a while and it is no longer working-we will taper off Has been on Paxil in the past-think she did okay with it, but not sure Start Paxil 20 mg daily after she is off of Lexapro She will update me via MyChart  She is not currently taking tamoxifen.  She knows that if she starts this medication we may need to switch the Paxil to something different

## 2019-12-22 NOTE — Assessment & Plan Note (Signed)
Following at the [facility] Getting Botox injections Takes Imitrex and Zofran as needed

## 2019-12-22 NOTE — Assessment & Plan Note (Signed)
Strong family history of diabetes She is exercising regularly Check A1c

## 2019-12-23 ENCOUNTER — Encounter: Payer: Self-pay | Admitting: Internal Medicine

## 2019-12-23 DIAGNOSIS — M542 Cervicalgia: Secondary | ICD-10-CM | POA: Diagnosis not present

## 2019-12-23 DIAGNOSIS — G43719 Chronic migraine without aura, intractable, without status migrainosus: Secondary | ICD-10-CM | POA: Diagnosis not present

## 2020-01-03 ENCOUNTER — Encounter: Payer: Self-pay | Admitting: Internal Medicine

## 2020-01-03 ENCOUNTER — Other Ambulatory Visit: Payer: Self-pay

## 2020-01-03 ENCOUNTER — Other Ambulatory Visit: Payer: Self-pay | Admitting: Internal Medicine

## 2020-01-03 MED ORDER — PAROXETINE HCL 20 MG PO TABS: 20.0000 mg | ORAL_TABLET | Freq: Every day | ORAL | 5 refills | Status: DC

## 2020-01-07 DIAGNOSIS — M8580 Other specified disorders of bone density and structure, unspecified site: Secondary | ICD-10-CM | POA: Diagnosis not present

## 2020-01-07 DIAGNOSIS — M545 Low back pain: Secondary | ICD-10-CM | POA: Diagnosis not present

## 2020-01-07 DIAGNOSIS — M5416 Radiculopathy, lumbar region: Secondary | ICD-10-CM | POA: Diagnosis not present

## 2020-01-07 DIAGNOSIS — M5136 Other intervertebral disc degeneration, lumbar region: Secondary | ICD-10-CM | POA: Diagnosis not present

## 2020-01-18 ENCOUNTER — Encounter: Payer: Self-pay | Admitting: Internal Medicine

## 2020-01-19 ENCOUNTER — Encounter: Payer: Self-pay | Admitting: Internal Medicine

## 2020-01-20 ENCOUNTER — Other Ambulatory Visit: Payer: Self-pay | Admitting: General Practice

## 2020-01-20 DIAGNOSIS — G43719 Chronic migraine without aura, intractable, without status migrainosus: Secondary | ICD-10-CM | POA: Diagnosis not present

## 2020-01-20 DIAGNOSIS — K219 Gastro-esophageal reflux disease without esophagitis: Secondary | ICD-10-CM

## 2020-01-20 MED ORDER — LANSOPRAZOLE 30 MG PO CPDR: DELAYED_RELEASE_CAPSULE | ORAL | 1 refills | Status: DC

## 2020-01-21 DIAGNOSIS — M5416 Radiculopathy, lumbar region: Secondary | ICD-10-CM | POA: Diagnosis not present

## 2020-01-21 DIAGNOSIS — M5136 Other intervertebral disc degeneration, lumbar region: Secondary | ICD-10-CM | POA: Diagnosis not present

## 2020-01-21 DIAGNOSIS — M25562 Pain in left knee: Secondary | ICD-10-CM | POA: Diagnosis not present

## 2020-01-25 DIAGNOSIS — G4733 Obstructive sleep apnea (adult) (pediatric): Secondary | ICD-10-CM | POA: Diagnosis not present

## 2020-01-31 ENCOUNTER — Other Ambulatory Visit: Payer: Self-pay | Admitting: Internal Medicine

## 2020-02-03 ENCOUNTER — Other Ambulatory Visit (INDEPENDENT_AMBULATORY_CARE_PROVIDER_SITE_OTHER): Payer: BC Managed Care – PPO

## 2020-02-03 ENCOUNTER — Encounter: Payer: Self-pay | Admitting: Internal Medicine

## 2020-02-03 DIAGNOSIS — Z833 Family history of diabetes mellitus: Secondary | ICD-10-CM | POA: Diagnosis not present

## 2020-02-03 DIAGNOSIS — E038 Other specified hypothyroidism: Secondary | ICD-10-CM

## 2020-02-03 DIAGNOSIS — G43809 Other migraine, not intractable, without status migrainosus: Secondary | ICD-10-CM

## 2020-02-03 DIAGNOSIS — K219 Gastro-esophageal reflux disease without esophagitis: Secondary | ICD-10-CM | POA: Diagnosis not present

## 2020-02-03 DIAGNOSIS — F3289 Other specified depressive episodes: Secondary | ICD-10-CM | POA: Diagnosis not present

## 2020-02-03 DIAGNOSIS — E7849 Other hyperlipidemia: Secondary | ICD-10-CM | POA: Diagnosis not present

## 2020-02-03 LAB — COMPREHENSIVE METABOLIC PANEL
ALT: 18 U/L (ref 0–35)
AST: 19 U/L (ref 0–37)
Albumin: 4.3 g/dL (ref 3.5–5.2)
Alkaline Phosphatase: 64 U/L (ref 39–117)
BUN: 15 mg/dL (ref 6–23)
CO2: 28 mEq/L (ref 19–32)
Calcium: 9.4 mg/dL (ref 8.4–10.5)
Chloride: 103 mEq/L (ref 96–112)
Creatinine, Ser: 0.78 mg/dL (ref 0.40–1.20)
GFR: 74.44 mL/min (ref >60.00)
Glucose, Bld: 93 mg/dL (ref 70–99)
Potassium: 3.8 mEq/L (ref 3.5–5.1)
Sodium: 138 mEq/L (ref 135–145)
Total Bilirubin: 0.7 mg/dL (ref 0.2–1.2)
Total Protein: 7 g/dL (ref 6.0–8.3)

## 2020-02-03 LAB — CBC WITH DIFFERENTIAL/PLATELET
Basophils Absolute: 0 10*3/uL (ref 0.0–0.1)
Basophils Relative: 0.3 % (ref 0.0–3.0)
Eosinophils Absolute: 0.1 10*3/uL (ref 0.0–0.7)
Eosinophils Relative: 1.4 % (ref 0.0–5.0)
HCT: 42.6 % (ref 36.0–46.0)
Hemoglobin: 14.2 g/dL (ref 12.0–15.0)
Lymphocytes Relative: 20.7 % (ref 12.0–46.0)
Lymphs Abs: 1.1 10*3/uL (ref 0.7–4.0)
MCHC: 33.4 g/dL (ref 30.0–36.0)
MCV: 85.3 fl (ref 78.0–100.0)
Monocytes Absolute: 0.4 10*3/uL (ref 0.1–1.0)
Monocytes Relative: 6.7 % (ref 3.0–12.0)
Neutro Abs: 3.9 10*3/uL (ref 1.4–7.7)
Neutrophils Relative %: 70.9 % (ref 43.0–77.0)
Platelets: 134 10*3/uL — ABNORMAL LOW (ref 150.0–400.0)
RBC: 5 Mil/uL (ref 3.87–5.11)
RDW: 13.8 % (ref 11.5–15.5)
WBC: 5.4 10*3/uL (ref 4.0–10.5)

## 2020-02-03 LAB — HEMOGLOBIN A1C: Hgb A1c MFr Bld: 5.7 % (ref 4.6–6.5)

## 2020-02-03 LAB — LIPID PANEL
Cholesterol: 252 mg/dL — ABNORMAL HIGH (ref 0–200)
HDL: 53.1 mg/dL (ref >39.00)
NonHDL: 198.75
Total CHOL/HDL Ratio: 5
Triglycerides: 209 mg/dL — ABNORMAL HIGH (ref 0.0–149.0)
VLDL: 41.8 mg/dL — ABNORMAL HIGH (ref 0.0–40.0)

## 2020-02-03 LAB — LDL CHOLESTEROL, DIRECT: Direct LDL: 157 mg/dL

## 2020-02-03 LAB — TSH: TSH: 3.11 u[IU]/mL (ref 0.35–4.50)

## 2020-02-08 DIAGNOSIS — M5136 Other intervertebral disc degeneration, lumbar region: Secondary | ICD-10-CM | POA: Diagnosis not present

## 2020-02-08 DIAGNOSIS — M5416 Radiculopathy, lumbar region: Secondary | ICD-10-CM | POA: Diagnosis not present

## 2020-02-10 DIAGNOSIS — G43719 Chronic migraine without aura, intractable, without status migrainosus: Secondary | ICD-10-CM | POA: Diagnosis not present

## 2020-02-10 DIAGNOSIS — M542 Cervicalgia: Secondary | ICD-10-CM | POA: Diagnosis not present

## 2020-02-25 DIAGNOSIS — G4733 Obstructive sleep apnea (adult) (pediatric): Secondary | ICD-10-CM | POA: Diagnosis not present

## 2020-02-29 ENCOUNTER — Other Ambulatory Visit: Payer: Self-pay | Admitting: Internal Medicine

## 2020-03-20 ENCOUNTER — Other Ambulatory Visit: Payer: BC Managed Care – PPO

## 2020-03-23 DIAGNOSIS — G43719 Chronic migraine without aura, intractable, without status migrainosus: Secondary | ICD-10-CM | POA: Diagnosis not present

## 2020-03-23 DIAGNOSIS — M542 Cervicalgia: Secondary | ICD-10-CM | POA: Diagnosis not present

## 2020-04-06 DIAGNOSIS — R399 Unspecified symptoms and signs involving the genitourinary system: Secondary | ICD-10-CM | POA: Diagnosis not present

## 2020-04-06 DIAGNOSIS — R32 Unspecified urinary incontinence: Secondary | ICD-10-CM | POA: Diagnosis not present

## 2020-04-06 DIAGNOSIS — N819 Female genital prolapse, unspecified: Secondary | ICD-10-CM | POA: Diagnosis not present

## 2020-04-11 DIAGNOSIS — G4733 Obstructive sleep apnea (adult) (pediatric): Secondary | ICD-10-CM | POA: Diagnosis not present

## 2020-04-12 DIAGNOSIS — N8182 Incompetence or weakening of pubocervical tissue: Secondary | ICD-10-CM | POA: Diagnosis not present

## 2020-04-13 DIAGNOSIS — N819 Female genital prolapse, unspecified: Secondary | ICD-10-CM | POA: Diagnosis not present

## 2020-04-13 DIAGNOSIS — R32 Unspecified urinary incontinence: Secondary | ICD-10-CM | POA: Diagnosis not present

## 2020-05-01 ENCOUNTER — Encounter: Payer: Self-pay | Admitting: Internal Medicine

## 2020-05-01 MED ORDER — SUMATRIPTAN SUCCINATE 100 MG PO TABS: 100.0000 mg | ORAL_TABLET | ORAL | 8 refills | Status: DC | PRN

## 2020-05-02 DIAGNOSIS — G43719 Chronic migraine without aura, intractable, without status migrainosus: Secondary | ICD-10-CM | POA: Diagnosis not present

## 2020-05-02 DIAGNOSIS — M542 Cervicalgia: Secondary | ICD-10-CM | POA: Diagnosis not present

## 2020-05-11 DIAGNOSIS — M542 Cervicalgia: Secondary | ICD-10-CM | POA: Diagnosis not present

## 2020-05-11 DIAGNOSIS — G43719 Chronic migraine without aura, intractable, without status migrainosus: Secondary | ICD-10-CM | POA: Diagnosis not present

## 2020-05-24 ENCOUNTER — Telehealth: Payer: Self-pay | Admitting: Gastroenterology

## 2020-05-24 NOTE — Telephone Encounter (Signed)
Please schedule her for an office visit to discuss.  It has been over a year since she has been seen.  Thanks

## 2020-05-24 NOTE — Telephone Encounter (Signed)
Pt is requesting to reschedule her colonoscopy and endoscopy from March of last year.

## 2020-05-29 DIAGNOSIS — H61002 Unspecified perichondritis of left external ear: Secondary | ICD-10-CM | POA: Diagnosis not present

## 2020-05-29 DIAGNOSIS — L821 Other seborrheic keratosis: Secondary | ICD-10-CM | POA: Diagnosis not present

## 2020-05-29 DIAGNOSIS — L570 Actinic keratosis: Secondary | ICD-10-CM | POA: Diagnosis not present

## 2020-05-29 DIAGNOSIS — D225 Melanocytic nevi of trunk: Secondary | ICD-10-CM | POA: Diagnosis not present

## 2020-05-29 DIAGNOSIS — D485 Neoplasm of uncertain behavior of skin: Secondary | ICD-10-CM | POA: Diagnosis not present

## 2020-05-29 DIAGNOSIS — D1801 Hemangioma of skin and subcutaneous tissue: Secondary | ICD-10-CM | POA: Diagnosis not present

## 2020-05-29 DIAGNOSIS — D2371 Other benign neoplasm of skin of right lower limb, including hip: Secondary | ICD-10-CM | POA: Diagnosis not present

## 2020-06-14 DIAGNOSIS — N3946 Mixed incontinence: Secondary | ICD-10-CM | POA: Diagnosis not present

## 2020-06-14 DIAGNOSIS — Z4689 Encounter for fitting and adjustment of other specified devices: Secondary | ICD-10-CM | POA: Diagnosis not present

## 2020-06-14 DIAGNOSIS — R32 Unspecified urinary incontinence: Secondary | ICD-10-CM | POA: Diagnosis not present

## 2020-06-14 DIAGNOSIS — N812 Incomplete uterovaginal prolapse: Secondary | ICD-10-CM | POA: Diagnosis not present

## 2020-06-14 DIAGNOSIS — N952 Postmenopausal atrophic vaginitis: Secondary | ICD-10-CM | POA: Diagnosis not present

## 2020-06-14 DIAGNOSIS — N393 Stress incontinence (female) (male): Secondary | ICD-10-CM | POA: Diagnosis not present

## 2020-06-14 DIAGNOSIS — N814 Uterovaginal prolapse, unspecified: Secondary | ICD-10-CM | POA: Diagnosis not present

## 2020-06-25 ENCOUNTER — Encounter: Payer: Self-pay | Admitting: Internal Medicine

## 2020-07-03 DIAGNOSIS — G43719 Chronic migraine without aura, intractable, without status migrainosus: Secondary | ICD-10-CM | POA: Diagnosis not present

## 2020-07-03 DIAGNOSIS — M542 Cervicalgia: Secondary | ICD-10-CM | POA: Diagnosis not present

## 2020-07-05 ENCOUNTER — Other Ambulatory Visit: Payer: Self-pay | Admitting: Internal Medicine

## 2020-07-05 DIAGNOSIS — K219 Gastro-esophageal reflux disease without esophagitis: Secondary | ICD-10-CM

## 2020-07-11 ENCOUNTER — Encounter: Payer: Self-pay | Admitting: Internal Medicine

## 2020-07-19 ENCOUNTER — Ambulatory Visit: Payer: BC Managed Care – PPO | Admitting: Gastroenterology

## 2020-07-31 DIAGNOSIS — G43719 Chronic migraine without aura, intractable, without status migrainosus: Secondary | ICD-10-CM | POA: Diagnosis not present

## 2020-07-31 DIAGNOSIS — M542 Cervicalgia: Secondary | ICD-10-CM | POA: Diagnosis not present

## 2020-08-11 ENCOUNTER — Ambulatory Visit: Payer: BC Managed Care – PPO | Admitting: Gastroenterology

## 2020-08-16 DIAGNOSIS — G43719 Chronic migraine without aura, intractable, without status migrainosus: Secondary | ICD-10-CM | POA: Diagnosis not present

## 2020-08-16 DIAGNOSIS — M542 Cervicalgia: Secondary | ICD-10-CM | POA: Diagnosis not present

## 2020-08-17 ENCOUNTER — Encounter: Payer: Self-pay | Admitting: Internal Medicine

## 2020-08-21 ENCOUNTER — Encounter: Payer: Self-pay | Admitting: Internal Medicine

## 2020-08-21 DIAGNOSIS — D219 Benign neoplasm of connective and other soft tissue, unspecified: Secondary | ICD-10-CM | POA: Insufficient documentation

## 2020-08-21 DIAGNOSIS — Z8669 Personal history of other diseases of the nervous system and sense organs: Secondary | ICD-10-CM | POA: Insufficient documentation

## 2020-08-28 ENCOUNTER — Other Ambulatory Visit: Payer: Self-pay | Admitting: Internal Medicine

## 2020-08-30 ENCOUNTER — Other Ambulatory Visit: Payer: Self-pay | Admitting: Internal Medicine

## 2020-08-31 DIAGNOSIS — Z4689 Encounter for fitting and adjustment of other specified devices: Secondary | ICD-10-CM | POA: Diagnosis not present

## 2020-08-31 DIAGNOSIS — D219 Benign neoplasm of connective and other soft tissue, unspecified: Secondary | ICD-10-CM | POA: Diagnosis not present

## 2020-08-31 DIAGNOSIS — N952 Postmenopausal atrophic vaginitis: Secondary | ICD-10-CM | POA: Diagnosis not present

## 2020-08-31 DIAGNOSIS — N812 Incomplete uterovaginal prolapse: Secondary | ICD-10-CM | POA: Diagnosis not present

## 2020-09-08 ENCOUNTER — Other Ambulatory Visit: Payer: Self-pay | Admitting: Internal Medicine

## 2020-09-08 DIAGNOSIS — Z9889 Other specified postprocedural states: Secondary | ICD-10-CM

## 2020-09-09 ENCOUNTER — Encounter: Payer: Self-pay | Admitting: Internal Medicine

## 2020-09-21 DIAGNOSIS — M7071 Other bursitis of hip, right hip: Secondary | ICD-10-CM | POA: Diagnosis not present

## 2020-09-21 DIAGNOSIS — M1712 Unilateral primary osteoarthritis, left knee: Secondary | ICD-10-CM | POA: Diagnosis not present

## 2020-10-05 DIAGNOSIS — M542 Cervicalgia: Secondary | ICD-10-CM | POA: Diagnosis not present

## 2020-10-05 DIAGNOSIS — G43719 Chronic migraine without aura, intractable, without status migrainosus: Secondary | ICD-10-CM | POA: Diagnosis not present

## 2020-10-06 ENCOUNTER — Ambulatory Visit
Admission: RE | Admit: 2020-10-06 | Discharge: 2020-10-06 | Disposition: A | Discharge status: Home / Self Care | Payer: BC Managed Care – PPO | Source: Ambulatory Visit | Attending: Internal Medicine | Admitting: Internal Medicine

## 2020-10-06 ENCOUNTER — Other Ambulatory Visit: Payer: Self-pay

## 2020-10-06 DIAGNOSIS — R922 Inconclusive mammogram: Secondary | ICD-10-CM | POA: Diagnosis not present

## 2020-10-06 DIAGNOSIS — Z853 Personal history of malignant neoplasm of breast: Secondary | ICD-10-CM | POA: Diagnosis not present

## 2020-10-06 DIAGNOSIS — Z9889 Other specified postprocedural states: Secondary | ICD-10-CM

## 2020-10-09 ENCOUNTER — Ambulatory Visit: Payer: BC Managed Care – PPO | Admitting: Gastroenterology

## 2020-11-02 DIAGNOSIS — G43719 Chronic migraine without aura, intractable, without status migrainosus: Secondary | ICD-10-CM | POA: Diagnosis not present

## 2020-11-02 DIAGNOSIS — M542 Cervicalgia: Secondary | ICD-10-CM | POA: Diagnosis not present

## 2020-11-09 DIAGNOSIS — M542 Cervicalgia: Secondary | ICD-10-CM | POA: Diagnosis not present

## 2020-11-09 DIAGNOSIS — G43719 Chronic migraine without aura, intractable, without status migrainosus: Secondary | ICD-10-CM | POA: Diagnosis not present

## 2020-11-18 DIAGNOSIS — G43719 Chronic migraine without aura, intractable, without status migrainosus: Secondary | ICD-10-CM | POA: Diagnosis not present

## 2020-11-18 DIAGNOSIS — H2513 Age-related nuclear cataract, bilateral: Secondary | ICD-10-CM | POA: Diagnosis not present

## 2020-11-22 ENCOUNTER — Ambulatory Visit: Payer: BC Managed Care – PPO | Admitting: Gastroenterology

## 2020-12-07 DIAGNOSIS — H2513 Age-related nuclear cataract, bilateral: Secondary | ICD-10-CM | POA: Diagnosis not present

## 2020-12-17 DIAGNOSIS — G4733 Obstructive sleep apnea (adult) (pediatric): Secondary | ICD-10-CM | POA: Diagnosis not present

## 2020-12-19 DIAGNOSIS — M25522 Pain in left elbow: Secondary | ICD-10-CM | POA: Diagnosis not present

## 2020-12-19 DIAGNOSIS — M25552 Pain in left hip: Secondary | ICD-10-CM | POA: Diagnosis not present

## 2020-12-21 DIAGNOSIS — N814 Uterovaginal prolapse, unspecified: Secondary | ICD-10-CM | POA: Diagnosis not present

## 2020-12-21 DIAGNOSIS — N3946 Mixed incontinence: Secondary | ICD-10-CM | POA: Diagnosis not present

## 2020-12-21 DIAGNOSIS — Z4689 Encounter for fitting and adjustment of other specified devices: Secondary | ICD-10-CM | POA: Diagnosis not present

## 2020-12-21 DIAGNOSIS — N952 Postmenopausal atrophic vaginitis: Secondary | ICD-10-CM | POA: Diagnosis not present

## 2020-12-25 ENCOUNTER — Encounter: Payer: Self-pay | Admitting: Internal Medicine

## 2021-01-12 ENCOUNTER — Other Ambulatory Visit: Payer: Self-pay | Admitting: Internal Medicine

## 2021-01-12 DIAGNOSIS — K219 Gastro-esophageal reflux disease without esophagitis: Secondary | ICD-10-CM

## 2021-01-19 ENCOUNTER — Encounter: Payer: Self-pay | Admitting: Gastroenterology

## 2021-01-19 ENCOUNTER — Ambulatory Visit: Payer: BC Managed Care – PPO | Admitting: Gastroenterology

## 2021-01-19 DIAGNOSIS — K219 Gastro-esophageal reflux disease without esophagitis: Secondary | ICD-10-CM

## 2021-01-19 DIAGNOSIS — R1110 Vomiting, unspecified: Secondary | ICD-10-CM

## 2021-01-19 MED ORDER — PLENVU 140 G PO SOLR
1.0000 | ORAL | 0 refills | Status: DC
Start: 2021-01-19 — End: 2021-03-30

## 2021-01-19 NOTE — Patient Instructions (Addendum)
If you are age 65 or younger, your body mass index should be between 19-25. Your Body mass index is 24.55 kg/m. If this is out of the aformentioned range listed, please consider follow up with your Primary Care Provider.   You have been scheduled for an endoscopy and colonoscopy. Please follow the written instructions given to you at your visit today. Please pick up your prep supplies at the pharmacy within the next 1-3 days. If you use inhalers (even only as needed), please bring them with you on the day of your procedure.  Due to recent changes in healthcare laws, you may see the results of your imaging and laboratory studies on MyChart before your provider has had a chance to review them.  We understand that in some cases there may be results that are confusing or concerning to you. Not all laboratory results come back in the same time frame and the provider may be waiting for multiple results in order to interpret others.  Please give [place] 48 hours in order for your provider to thoroughly review all the results before contacting the office for clarification of your results.   Thank you for entrusting me with your care and choosing [facility].  Dr Ardis Hughs

## 2021-01-19 NOTE — Progress Notes (Signed)
HPI: This is a pleasant 65 year old woman  I last saw her here in our office about 2-1/2 years ago, October 2019.  That was the first time I had met her.  Her chief complaint then was diarrhea and vomiting.  She was very clear at that visit that her symptoms started after her gallbladder was removed about 7 years prior.  I offered trial of cholestyramine powder however she declined it.  I recommended upper endoscopy to exclude other causes of her vomiting such as gastritis, peptic ulcer disease.  It had been at least 10 years from that visit since her previous screening colonoscopy and so at the same time as her upper endoscopy I recommended a colonoscopy.  She canceled the colonoscopy and upper endoscopy which was scheduled for early March 2020.    She called back July 2021 about rescheduling these procedures and was recommended to be seen in the office first.  We have not heard from her since then.  Her weight is up 1 pound from her last office visit here 2-1/2 years ago, same scale.  Today she discussed these episodes again.  Severe diarrhea and vomiting lasting for several hours.  These episodes occur every 6 to 12 months at the most.  Between the episodes she really has no troubles with her bowels.  Solid brown stools.  No bleeding.  She does not have significant abdominal pains preceding any of these episodes.  She does tend to have chronic GERD and indigestion for which Prevacid and periodic Tums is very helpful.  She has no dysphagia.  Her weight is overall stable.  She was taking 2 Aleve once daily for about 2 or 3 weeks leading into her most recent episode which was about a month ago.   ROS: complete GI ROS as described in HPI, all other review negative.  Constitutional:  No unintentional weight loss   Past Medical History:  Diagnosis Date  . Allergy   . Anemia   . Anxiety   . Cancer [facility])    breast cancer  . Depression   . GERD (gastroesophageal reflux disease)   .  Hypertension   . Migraine   . Migraine   . Osteopenia   . Sleep apnea   . Sleep apnea with use of continuous positive airway pressure (CPAP)   . Thyroid disease     Past Surgical History:  Procedure Laterality Date  . BREAST LUMPECTOMY Right 10/22/2018  . BREAST LUMPECTOMY WITH RADIOACTIVE SEED LOCALIZATION Right 10/22/2018   Procedure: BREAST LUMPECTOMY WITH RADIOACTIVE SEED LOCALIZATION;  Surgeon: Excell Seltzer, MD;  Location: [place];  Service: General;  Laterality: Right;  . COLONOSCOPY    . GALLBLADDER SURGERY    . KNEE ARTHROSCOPY Left   . NEUROFIBROMA EXCISION  1986  . PALATE / UVULA BIOPSY / EXCISION      Current Outpatient Medications  Medication Sig Dispense Refill  . calcium-vitamin D (OSCAL WITH D) 500-200 MG-UNIT tablet Take 1 tablet by mouth.    . COLLAGEN PO Take by mouth.    . lansoprazole (PREVACID) 30 MG capsule TAKE 1 CAPSULE BY MOUTH DAILY AT NOON 90 capsule 1  . levothyroxine (SYNTHROID) 50 MCG tablet TAKE 1 TABLET BY MOUTH DAILY BEFORE BREAKFAST 90 tablet 1  . Magnesium 400 MG TABS Take 200 mg by mouth daily.     Marland Kitchen PARoxetine (PAXIL) 20 MG tablet Take 1 tablet (20 mg total) by mouth daily. Annual appt due in Feb must see provider  for future refills 30 tablet 4  . SUMAtriptan (IMITREX) 100 MG tablet Take 1 tablet (100 mg total) by mouth every 2 (two) hours as needed for migraine. May repeat in 2 hours if headache persists or recurs. 10 tablet 8  . Turmeric (QC TUMERIC COMPLEX PO) Take by mouth daily in the afternoon.    . vitamin B-12 (CYANOCOBALAMIN) 500 MCG tablet Take 1,000 mcg by mouth daily.      No current facility-administered medications for this visit.    Allergies as of 01/19/2021 - Review Complete 01/19/2021  Allergen Reaction Noted  . Codeine Itching 03/17/2018    Family History  Problem Relation Age of Onset  . Arthritis Mother   . Diabetes Mother   . Heart disease Mother   . Hypertension Mother   . Stroke Mother    . Arthritis Father   . Depression Father   . Diabetes Father   . Hyperlipidemia Father   . Hypertension Father   . Depression Brother   . Hypertension Brother   . Heart disease Maternal Grandmother   . COPD Paternal Grandmother   . COPD Paternal Grandfather   . Colon cancer Paternal Grandfather   . Colon polyps Neg Hx   . Esophageal cancer Neg Hx   . Stomach cancer Neg Hx   . Rectal cancer Neg Hx     Social History   Socioeconomic History  . Marital status: Legally Separated    Spouse name: Not on file  . Number of children: 1  . Years of education: Not on file  . Highest education level: Not on file  Occupational History  . Occupation: retired  Tobacco Use  . Smoking status: Never Smoker  . Smokeless tobacco: Never Used  Vaping Use  . Vaping Use: Never used  Substance and Sexual Activity  . Alcohol use: Yes    Comment: rarely  . Drug use: No  . Sexual activity: Never  Other Topics Concern  . Not on file  Social History Narrative   No regular exercise   Social Determinants of Health   Financial Resource Strain: Not on file  Food Insecurity: Not on file  Transportation Needs: Not on file  Physical Activity: Not on file  Stress: Not on file  Social Connections: Not on file  Intimate Partner Violence: Not on file     Physical Exam: BP 132/84 (BP Location: Left Arm, Patient Position: Sitting)   Pulse 86   Ht 5' 4"  (1.626 m)   Wt 143 lb (64.9 kg)   SpO2 98%   BMI 24.55 kg/m  Constitutional: generally well-appearing Psychiatric: alert and oriented x3 Abdomen: soft, nontender, nondistended, no obvious ascites, no peritoneal signs, normal bowel sounds No peripheral edema noted in lower extremities  Assessment and plan: 65 y.o. female with chronic GERD, intermittent vomiting and diarrhea episodes, routine risk for colon cancer  First I explained to her that it is often difficult to determine because of a symptom which only occurs every 6 or 12 months.   This would be unusual pattern for GERD related issues, H. pylori related issues, biliary related issues, postcholecystectomy issues.  She did take some Aleve 2 pills daily for 2 or 3 weeks leading into her recent episode.  Perhaps her episodes are NSAID, gastritis related symptoms.  I recommend a colonoscopy to get her up-to-date on colon cancer screening as well as EGD for her chronic GERD and intermittent vomiting.  I explained to her that I am not sure these will  find an explanation for her episodic symptoms but certainly we could be helpful.  I see no reason for any further blood tests or imaging studies prior to then.  Please see the "Patient Instructions" section for addition details about the plan.  Owens Loffler, MD [facility] 01/19/2021, 9:19 AM   Total time on date of encounter was 30 minutes (this included time spent preparing to see the patient reviewing records; obtaining and/or reviewing separately obtained history; performing a medically appropriate exam and/or evaluation; counseling and educating the patient and family if present; ordering medications, tests or procedures if applicable; and documenting clinical information in the health record).

## 2021-02-01 DIAGNOSIS — G43719 Chronic migraine without aura, intractable, without status migrainosus: Secondary | ICD-10-CM | POA: Diagnosis not present

## 2021-02-08 DIAGNOSIS — G43719 Chronic migraine without aura, intractable, without status migrainosus: Secondary | ICD-10-CM | POA: Diagnosis not present

## 2021-02-08 DIAGNOSIS — M542 Cervicalgia: Secondary | ICD-10-CM | POA: Diagnosis not present

## 2021-02-20 ENCOUNTER — Telehealth: Payer: Self-pay | Admitting: Internal Medicine

## 2021-02-20 NOTE — Telephone Encounter (Signed)
Please advise her on her last Tdap.  That shot is good for 10 years.  Typically we do not just order blood work on request-she is due for annual follow-up and blood work.

## 2021-02-20 NOTE — Telephone Encounter (Signed)
Called and spoke with patient. She will call back for appointment.

## 2021-02-20 NOTE — Telephone Encounter (Signed)
Patient called and was wondering if she had had her DTap shot. If not she was wondering if she could get it. She was also wondering how long it takes the shot to go into effect. She is also requesting blood work. She can be reached at [phone]. Please advise

## 2021-02-21 ENCOUNTER — Telehealth: Payer: Self-pay | Admitting: Gastroenterology

## 2021-02-21 NOTE — Telephone Encounter (Signed)
Patient called states  The Plenvu prep medication is on back order and she needs an alternative sent also requested a call once done.

## 2021-02-23 NOTE — Telephone Encounter (Signed)
Patient advised that we are awake there is a back order on Plenvu. Patient advised she could use Miralax prep, but she prefers to use prescription bowel prep if at all possible as she has had preps before that she was not completely cleaned out.  Patient advised to wait a few more weeks to see if Plenvu will be available at pharmacy and if not we will try to make a sample available for patient to pick up.  Patient agreed to plan and verbalized understanding.  No further questions.

## 2021-03-09 NOTE — Telephone Encounter (Signed)
Phone call to patient to make her aware that Plenvu sample has been left at front desk for pick up. Patient is out of town and will come back next week to pick up sample.   Patient agreed to plan and verbalized understanding.  No further questions.

## 2021-03-14 ENCOUNTER — Telehealth: Payer: Self-pay | Admitting: Gastroenterology

## 2021-03-14 NOTE — Telephone Encounter (Signed)
Patient has a pessary and is asking if it needs to be removed for her to have her EGD\Colon procedure. Please advise.

## 2021-03-14 NOTE — Telephone Encounter (Signed)
Patient advised that the pessary will have no impact on colonoscopy or upper endoscopy and can remain in place. Patient agreed to plan and verbalized understanding.  No further questions.

## 2021-03-14 NOTE — Telephone Encounter (Signed)
Let her know that the pessary will have no impact on colonoscopy or upper endoscopy and so it can remain in place.

## 2021-03-19 ENCOUNTER — Encounter: Payer: BC Managed Care – PPO | Admitting: Gastroenterology

## 2021-03-21 DIAGNOSIS — N812 Incomplete uterovaginal prolapse: Secondary | ICD-10-CM | POA: Diagnosis not present

## 2021-03-21 DIAGNOSIS — N393 Stress incontinence (female) (male): Secondary | ICD-10-CM | POA: Diagnosis not present

## 2021-03-21 DIAGNOSIS — G43719 Chronic migraine without aura, intractable, without status migrainosus: Secondary | ICD-10-CM | POA: Diagnosis not present

## 2021-03-21 DIAGNOSIS — D219 Benign neoplasm of connective and other soft tissue, unspecified: Secondary | ICD-10-CM | POA: Diagnosis not present

## 2021-03-21 DIAGNOSIS — M542 Cervicalgia: Secondary | ICD-10-CM | POA: Diagnosis not present

## 2021-03-21 DIAGNOSIS — N952 Postmenopausal atrophic vaginitis: Secondary | ICD-10-CM | POA: Diagnosis not present

## 2021-03-21 DIAGNOSIS — N814 Uterovaginal prolapse, unspecified: Secondary | ICD-10-CM | POA: Diagnosis not present

## 2021-03-23 ENCOUNTER — Other Ambulatory Visit: Payer: Self-pay | Admitting: Internal Medicine

## 2021-03-28 ENCOUNTER — Telehealth: Payer: Self-pay

## 2021-03-28 DIAGNOSIS — N812 Incomplete uterovaginal prolapse: Secondary | ICD-10-CM | POA: Insufficient documentation

## 2021-03-28 DIAGNOSIS — N398 Other specified disorders of urinary system: Secondary | ICD-10-CM | POA: Insufficient documentation

## 2021-03-28 DIAGNOSIS — N393 Stress incontinence (female) (male): Secondary | ICD-10-CM | POA: Diagnosis not present

## 2021-03-28 NOTE — Telephone Encounter (Signed)
Got it, thanks.  If that is our policy we need to follow it.

## 2021-03-28 NOTE — Telephone Encounter (Signed)
Hi Dr. Ardis Hughs, another [facility]. I called this pt to remind her of procedure on 03/30/21. Pt stated that her son was in contact with a covid positive pt last Saturday (03/24/21) and is awaiting his covid test results. She has been vaccinated but not boosted, and she is currently not having any symptoms. Pt given [place] numbers to call with her sons covid results. If son's covid results are positive, Per [place] policy she would have to reschedule because of a close contact exposure.

## 2021-03-30 ENCOUNTER — Encounter: Payer: Self-pay | Admitting: Gastroenterology

## 2021-03-30 ENCOUNTER — Ambulatory Visit (AMBULATORY_SURGERY_CENTER): Payer: BC Managed Care – PPO | Admitting: Gastroenterology

## 2021-03-30 ENCOUNTER — Other Ambulatory Visit: Payer: Self-pay

## 2021-03-30 VITALS — BP 155/84 | HR 68 | Temp 96.8°F | Resp 15 | Ht 64.0 in | Wt 143.0 lb

## 2021-03-30 DIAGNOSIS — R110 Nausea: Secondary | ICD-10-CM

## 2021-03-30 DIAGNOSIS — Z1211 Encounter for screening for malignant neoplasm of colon: Secondary | ICD-10-CM

## 2021-03-30 DIAGNOSIS — R12 Heartburn: Secondary | ICD-10-CM | POA: Diagnosis not present

## 2021-03-30 DIAGNOSIS — K2970 Gastritis, unspecified, without bleeding: Secondary | ICD-10-CM

## 2021-03-30 DIAGNOSIS — D123 Benign neoplasm of transverse colon: Secondary | ICD-10-CM | POA: Diagnosis not present

## 2021-03-30 DIAGNOSIS — K219 Gastro-esophageal reflux disease without esophagitis: Secondary | ICD-10-CM | POA: Diagnosis not present

## 2021-03-30 MED ORDER — SODIUM CHLORIDE 0.9 % IV SOLN: 500.0000 mL | Freq: Once | INTRAVENOUS | Status: DC

## 2021-03-30 NOTE — Progress Notes (Signed)
Report to PACU, RN, vss, BBS= Clear.  

## 2021-03-30 NOTE — Progress Notes (Signed)
VS taken by [facility]

## 2021-03-30 NOTE — Op Note (Signed)
[facility] Patient Name: Harbor Paster Procedure Date: 03/30/2021 10:05 AM MRN: [id number] Endoscopist: Milus Banister , MD Age: 65 Referring MD:  Date of Birth: Jan 25, 1956 Gender: Female Account #: [id number] Procedure:                Upper GI endoscopy Indications:              Heartburn, intermittent vomiting Medicines:                Monitored Anesthesia Care Procedure:                Pre-Anesthesia Assessment:                           - Prior to the procedure, a History and Physical                            was performed, and patient medications and                            allergies were reviewed. The patient's tolerance of                            previous anesthesia was also reviewed. The risks                            and benefits of the procedure and the sedation                            options and risks were discussed with the patient.                            All questions were answered, and informed consent                            was obtained. Prior Anticoagulants: The patient has                            taken no previous anticoagulant or antiplatelet                            agents. ASA Grade Assessment: II - A patient with                            mild systemic disease. After reviewing the risks                            and benefits, the patient was deemed in                            satisfactory condition to undergo the procedure.                           After obtaining informed consent, the endoscope was  passed under direct vision. Throughout the                            procedure, the patient's blood pressure, pulse, and                            oxygen saturations were monitored continuously. The                            Endoscope was introduced through the mouth, and                            advanced to the second part of duodenum. The upper                            GI endoscopy was  accomplished without difficulty.                            The patient tolerated the procedure well. Scope In: Scope Out: Findings:                 Mild inflammation characterized by erythema and                            granularity was found in the gastric antrum.                            Biopsies were taken with a cold forceps for                            histology.                           The exam was otherwise without abnormality. Complications:            No immediate complications. Estimated blood loss:                            None. Estimated Blood Loss:     Estimated blood loss: none. Impression:               - Mild, non-specific gastritis. Biopsied to check                            for H. pylori.                           - The examination was otherwise normal. Recommendation:           - Patient has a contact number available for                            emergencies. The signs and symptoms of potential                            delayed complications were discussed with the  patient. Return to normal activities tomorrow.                            Written discharge instructions were provided to the                            patient.                           - Resume previous diet.                           - Continue present medications.                           - Await pathology results. Milus Banister, MD 03/30/2021 10:40:16 AM This report has been signed electronically.

## 2021-03-30 NOTE — Op Note (Signed)
[facility] Patient Name: Tracie Hart Procedure Date: 03/30/2021 10:06 AM MRN: [id number] Endoscopist: Milus Banister , MD Age: 65 Referring MD:  Date of Birth: 02/11/1956 Gender: Female Account #: [id number] Procedure:                Colonoscopy Indications:              Screening for colorectal malignant neoplasm Medicines:                Monitored Anesthesia Care Procedure:                Pre-Anesthesia Assessment:                           - Prior to the procedure, a History and Physical                            was performed, and patient medications and                            allergies were reviewed. The patient's tolerance of                            previous anesthesia was also reviewed. The risks                            and benefits of the procedure and the sedation                            options and risks were discussed with the patient.                            All questions were answered, and informed consent                            was obtained. Prior Anticoagulants: The patient has                            taken no previous anticoagulant or antiplatelet                            agents. ASA Grade Assessment: II - A patient with                            mild systemic disease. After reviewing the risks                            and benefits, the patient was deemed in                            satisfactory condition to undergo the procedure.                           After obtaining informed consent, the colonoscope  was passed under direct vision. Throughout the                            procedure, the patient's blood pressure, pulse, and                            oxygen saturations were monitored continuously. The                            Olympus CF-[id number]) Colonoscope was                            introduced through the anus and advanced to the the                            cecum, identified  by appendiceal orifice and                            ileocecal valve. The colonoscopy was performed                            without difficulty. The patient tolerated the                            procedure well. The quality of the bowel                            preparation was good. The ileocecal valve,                            appendiceal orifice, and rectum were photographed. Scope In: 10:15:18 AM Scope Out: 10:29:22 AM Scope Withdrawal Time: 0 hours 8 minutes 8 seconds  Total Procedure Duration: 0 hours 14 minutes 4 seconds  Findings:                 A 6 mm polyp was found in the transverse colon. The                            polyp was sessile. The polyp was removed with a                            cold snare. Resection and retrieval were complete.                           Internal hemorrhoids were found. The hemorrhoids                            were small.                           The exam was otherwise without abnormality on                            direct and retroflexion views. Complications:  No immediate complications. Estimated blood loss:                            None. Estimated Blood Loss:     Estimated blood loss: none. Impression:               - One 6 mm polyp in the transverse colon, removed                            with a cold snare. Resected and retrieved.                           - Internal hemorrhoids.                           - The examination was otherwise normal on direct                            and retroflexion views. Recommendation:           - Await pathology results.                           - EGD now. Milus Banister, MD 03/30/2021 10:31:23 AM This report has been signed electronically.

## 2021-03-30 NOTE — Patient Instructions (Signed)
YOU HAD AN ENDOSCOPIC PROCEDURE TODAY AT THE [facility]:   Refer to the procedure report that was given to you for any specific questions about what was found during the examination.  If the procedure report does not answer your questions, please call your gastroenterologist to clarify.  If you requested that your care partner not be given the details of your procedure findings, then the procedure report has been included in a sealed envelope for you to review at your convenience later.  YOU SHOULD EXPECT: Some feelings of bloating in the abdomen. Passage of more gas than usual.  Walking can help get rid of the air that was put into your GI tract during the procedure and reduce the bloating. If you had a lower endoscopy (such as a colonoscopy or flexible sigmoidoscopy) you may notice spotting of blood in your stool or on the toilet paper. If you underwent a bowel prep for your procedure, you may not have a normal bowel movement for a few days.  Please Note:  You might notice some irritation and congestion in your nose or some drainage.  This is from the oxygen used during your procedure.  There is no need for concern and it should clear up in a day or so.  SYMPTOMS TO REPORT IMMEDIATELY:   Following lower endoscopy (colonoscopy or flexible sigmoidoscopy):  Excessive amounts of blood in the stool  Significant tenderness or worsening of abdominal pains  Swelling of the abdomen that is new, acute  Fever of 100F or higher   Following upper endoscopy (EGD)  Vomiting of blood or coffee ground material  New chest pain or pain under the shoulder blades  Painful or persistently difficult swallowing  New shortness of breath  Fever of 100F or higher  Black, tarry-looking stools  For urgent or emergent issues, a gastroenterologist can be reached at any hour by calling [phone]. Do not use MyChart messaging for urgent concerns.    DIET:  We do recommend a small meal at first, but  then you may proceed to your regular diet.  Drink plenty of fluids but you should avoid alcoholic beverages for 24 hours.  ACTIVITY:  You should plan to take it easy for the rest of today and you should NOT DRIVE or use heavy machinery until tomorrow (because of the sedation medicines used during the test).    FOLLOW UP: Our staff will call the number listed on your records 48-72 hours following your procedure to check on you and address any questions or concerns that you may have regarding the information given to you following your procedure. If we do not reach you, we will leave a message.  We will attempt to reach you two times.  During this call, we will ask if you have developed any symptoms of COVID 19. If you develop any symptoms (ie: fever, flu-like symptoms, shortness of breath, cough etc.) before then, please call [phone].  If you test positive for Covid 19 in the 2 weeks post procedure, please call and report this information to us.    If any biopsies were taken you will be contacted by phone or by letter within the next 1-3 weeks.  Please call us at [phone] if you have not heard about the biopsies in 3 weeks.    SIGNATURES/CONFIDENTIALITY: You and/or your care partner have signed paperwork which will be entered into your electronic medical record.  These signatures attest to the fact that that the information above on   your After Visit Summary has been reviewed and is understood.  Full responsibility of the confidentiality of this discharge information lies with you and/or your care-partner. 

## 2021-03-30 NOTE — Progress Notes (Signed)
VS by SM.

## 2021-03-30 NOTE — Progress Notes (Signed)
Called to room to assist during endoscopic procedure.  Patient ID and intended procedure confirmed with present staff. Received instructions for my participation in the procedure from the performing physician.  

## 2021-04-03 ENCOUNTER — Telehealth: Payer: Self-pay | Admitting: *Deleted

## 2021-04-03 NOTE — Telephone Encounter (Signed)
  Follow up Call-  Call back number 03/30/2021  Post procedure Call Back phone  # [phone]  Permission to leave phone message Yes  Some recent data might be hidden     Patient questions:  Do you have a fever, pain , or abdominal swelling? No. Pain Score  0 *  Have you tolerated food without any problems? Yes.    Have you been able to return to your normal activities? Yes.    Do you have any questions about your discharge instructions: Diet   No. Medications  No. Follow up visit  No.  Do you have questions or concerns about your Care? No.  Actions: * If pain score is 4 or above: No action needed, pain <4  1. Have you developed a fever since your procedure? no  2.   Have you had an respiratory symptoms (SOB or cough) since your procedure? no  3.   Have you tested positive for COVID 19 since your procedure no  4.   Have you had any family members/close contacts diagnosed with the COVID 19 since your procedure?  no   If yes to any of these questions please route to Joylene John, RN and Joella Prince, RN

## 2021-04-05 ENCOUNTER — Telehealth: Payer: Self-pay | Admitting: Gastroenterology

## 2021-04-05 NOTE — Telephone Encounter (Signed)
Inbound call from patient requesting a call with her procedure results please.

## 2021-04-05 NOTE — Telephone Encounter (Signed)
The pt has been advised that the results are not available as of today. We will contact her as soon as available.

## 2021-04-06 ENCOUNTER — Other Ambulatory Visit: Payer: Self-pay | Admitting: Internal Medicine

## 2021-04-06 NOTE — Telephone Encounter (Signed)
I sent in a 30-day supply only of her Paxil.  She has not been seen in over a year and needs to come in for follow-up or a physical

## 2021-04-09 ENCOUNTER — Encounter: Payer: Self-pay | Admitting: Gastroenterology

## 2021-04-09 NOTE — Telephone Encounter (Signed)
Inbound call from patient. Have questions about her test results. States she had an episode of her using the bathroom.It starts coming out back and vomiting. Says it is something with her stomach. Best contact number [phone]

## 2021-04-09 NOTE — Telephone Encounter (Signed)
Left message on machine to call back  

## 2021-04-09 NOTE — Telephone Encounter (Signed)
[facility] [place] [address] [place] [address]     Dear Ms. Aiken,   One polyp which I removed during your recent procedure was proven to be completely benign but is considered a "pre-cancerous" polyp that MAY have grown into cancer if it had not been removed.  Studies shows that at least 20% of women over age 65 and 30% of men over age 8 have pre-cancerous polyps.  Based on current nationally recognized surveillance guidelines, I recommend that you have a repeat colonoscopy in 7 years.    The biopsies from your stomach show no sign of infection, serious inflammation or cancer.   If you develop any new rectal bleeding, abdominal pain or significant bowel habit changes, please contact me before then.     Sincerely,       Milus Banister, MD  [facility]                       [facility] [place], [phone]                            1

## 2021-04-10 ENCOUNTER — Other Ambulatory Visit: Payer: Self-pay

## 2021-04-10 DIAGNOSIS — R1110 Vomiting, unspecified: Secondary | ICD-10-CM

## 2021-04-10 DIAGNOSIS — R112 Nausea with vomiting, unspecified: Secondary | ICD-10-CM

## 2021-04-10 DIAGNOSIS — R197 Diarrhea, unspecified: Secondary | ICD-10-CM

## 2021-04-10 NOTE — Telephone Encounter (Signed)
The pt states that she continues to have vomiting episodes.  She says it usually starts when having a BM.  Soon after BM she has nausea and vomiting while on the commode.  This usually happens every few months.  She wants to know what she should do now.  She is taking prevacid 20-30 min prior to breakfast daily.

## 2021-04-10 NOTE — Telephone Encounter (Signed)
Message left for patient today and also via my-chart to schedule to prevent future denials for medication refill.

## 2021-04-10 NOTE — Telephone Encounter (Signed)
Her episodes are very infrequent (once every 6 months or so).    Lets get an UGI with SB[facility]  thanks

## 2021-04-10 NOTE — Telephone Encounter (Signed)
The order has been entered and the pt notified.  She has been sent the information via My Chart as well.  She will call the schedulers if she does not hear from them in 1 week. Order message sent to the schedulers.

## 2021-04-12 NOTE — Progress Notes (Signed)
Subjective:    Patient ID: Tracie Hart, female    DOB: 1956/04/12, 65 y.o.   MRN: [id number]   This visit occurred during the SARS-CoV-2 public health emergency.  Safety protocols were in place, including screening questions prior to the visit, additional usage of staff PPE, and extensive cleaning of exam room while observing appropriate contact time as indicated for disinfecting solutions.    HPI She is here for a physical exam.   She will have a TAH and bladder sling with Dr Rodena Piety.  August 16th.    Has occ episodes of vomiting while having a BM. she does have some abdominal cramping.  She gets very nauseous.  After she would feel great, but recently it will take her three days to feel better - her stomach will hurt.  GI will do an upper GI series.  One time it did occur after eating a large amount of ice cream.  She thinks it tends to occur with larger BM's and often the end of the BM is loose  Medications and allergies reviewed with patient and updated if appropriate.  Patient Active Problem List   Diagnosis Date Noted  . Nausea 07/09/2019  . Difficulty sleeping 07/09/2019  . Left lower quadrant abdominal pain 12/30/2018  . Rash and nonspecific skin eruption 12/30/2018  . Malignant neoplasm of upper-outer quadrant of right female breast ([place]) 12/15/2018  . Ductal carcinoma in situ (DCIS) of right breast 10/08/2018  . Osteopenia 09/08/2018  . Loose stools 07/28/2018  . Dizziness 03/17/2018  . Pain of upper abdomen 03/17/2018  . Chronic pain of left knee 03/17/2018  . Multiple lipomas 01/05/2018  . Hypothyroidism 11/03/2017  . Depression 11/03/2017  . Family history of diabetes mellitus (DM) 11/03/2017  . Migraines 11/03/2017  . GERD (gastroesophageal reflux disease) 11/03/2017  . OSA (obstructive sleep apnea) 11/03/2017  . Hyperlipidemia 11/03/2017  . Overactive bladder 11/03/2017    Current Outpatient Medications on File Prior to Visit  Medication Sig  Dispense Refill  . calcium-vitamin D (OSCAL WITH D) 500-200 MG-UNIT tablet Take 1 tablet by mouth.    . COLLAGEN PO Take by mouth.    . lansoprazole (PREVACID) 30 MG capsule TAKE 1 CAPSULE BY MOUTH DAILY AT NOON 90 capsule 1  . levothyroxine (SYNTHROID) 50 MCG tablet TAKE 1 TABLET BY MOUTH DAILY BEFORE BREAKFAST 90 tablet 0  . Magnesium 400 MG TABS Take 200 mg by mouth daily.     Marland Kitchen PARoxetine (PAXIL) 20 MG tablet TAKE 1 TABLET BY MOUTH EVERY DAY 30 tablet 0  . SUMAtriptan (IMITREX) 100 MG tablet Take 1 tablet (100 mg total) by mouth every 2 (two) hours as needed for migraine. May repeat in 2 hours if headache persists or recurs. 10 tablet 8  . Turmeric (QC TUMERIC COMPLEX PO) Take by mouth daily in the afternoon.    . vitamin B-12 (CYANOCOBALAMIN) 500 MCG tablet Take 1,000 mcg by mouth daily.      No current facility-administered medications on file prior to visit.    Past Medical History:  Diagnosis Date  . Allergy   . Anemia   . Anxiety   . Cancer [facility])    breast cancer  . Depression   . GERD (gastroesophageal reflux disease)   . Hypertension   . Migraine   . Migraine   . Osteopenia   . Sleep apnea   . Sleep apnea with use of continuous positive airway pressure (CPAP)   . Thyroid disease  Past Surgical History:  Procedure Laterality Date  . BREAST LUMPECTOMY Right 10/22/2018  . BREAST LUMPECTOMY WITH RADIOACTIVE SEED LOCALIZATION Right 10/22/2018   Procedure: BREAST LUMPECTOMY WITH RADIOACTIVE SEED LOCALIZATION;  Surgeon: Excell Seltzer, MD;  Location: [place] [facility];  Service: General;  Laterality: Right;  . COLONOSCOPY    . GALLBLADDER SURGERY    . KNEE ARTHROSCOPY Left   . NEUROFIBROMA EXCISION  1986  . PALATE / UVULA BIOPSY / EXCISION      Social History   Socioeconomic History  . Marital status: Divorced    Spouse name: Not on file  . Number of children: 1  . Years of education: Not on file  . Highest education level: Not on file   Occupational History  . Occupation: retired  Tobacco Use  . Smoking status: Never Smoker  . Smokeless tobacco: Never Used  Vaping Use  . Vaping Use: Never used  Substance and Sexual Activity  . Alcohol use: Yes    Comment: rarely  . Drug use: No  . Sexual activity: Never  Other Topics Concern  . Not on file  Social History Narrative   No regular exercise   Social Determinants of Health   Financial Resource Strain: Not on file  Food Insecurity: Not on file  Transportation Needs: Not on file  Physical Activity: Not on file  Stress: Not on file  Social Connections: Not on file    Family History  Problem Relation Age of Onset  . Arthritis Mother   . Diabetes Mother   . Heart disease Mother   . Hypertension Mother   . Stroke Mother   . Arthritis Father   . Depression Father   . Diabetes Father   . Hyperlipidemia Father   . Hypertension Father   . Depression Brother   . Hypertension Brother   . Heart disease Maternal Grandmother   . COPD Paternal Grandmother   . COPD Paternal Grandfather   . Colon cancer Paternal Grandfather   . Colon polyps Neg Hx   . Esophageal cancer Neg Hx   . Stomach cancer Neg Hx   . Rectal cancer Neg Hx     Review of Systems  Constitutional: Negative for chills and fever.  Eyes: Negative for visual disturbance.  Respiratory: Negative for cough, shortness of breath and wheezing.   Cardiovascular: Negative for chest pain, palpitations and leg swelling.  Gastrointestinal: Positive for abdominal pain (cramping episodes), diarrhea (at times), nausea and vomiting. Negative for blood in stool and constipation.       Daily gerd  Genitourinary: Negative for dysuria.  Musculoskeletal: Positive for arthralgias (minimal) and back pain (minimal).  Skin: Negative for rash.  Neurological: Positive for headaches (migraines). Negative for dizziness and light-headedness.  Psychiatric/Behavioral: Positive for dysphoric mood. The patient is  nervous/anxious.        Objective:   Vitals:   04/13/21 1110  BP: 128/78  Pulse: 67  Temp: 98.5 F (36.9 C)  SpO2: 97%   Filed Weights   04/13/21 1110  Weight: 143 lb (64.9 kg)   Body mass index is 24.55 kg/m.  BP Readings from Last 3 Encounters:  04/13/21 128/78  03/30/21 (!) 155/84  01/19/21 132/84    Wt Readings from Last 3 Encounters:  04/13/21 143 lb (64.9 kg)  03/30/21 143 lb (64.9 kg)  01/19/21 143 lb (64.9 kg)     Physical Exam Constitutional: She appears well-developed and well-nourished. No distress.  HENT:  Head: Normocephalic and atraumatic.  Right  Ear: External ear normal. Normal ear canal and TM Left Ear: External ear normal.  Normal ear canal and TM Mouth/Throat: Oropharynx is clear and moist.  Eyes: Conjunctivae and EOM are normal.  Neck: Neck supple. No tracheal deviation present. No thyromegaly present.  No carotid bruit  Cardiovascular: Normal rate, regular rhythm and normal heart sounds.   No murmur heard.  No edema. Pulmonary/Chest: Effort normal and breath sounds normal. No respiratory distress. She has no wheezes. She has no rales.  Breast: deferred   Abdominal: Soft. She exhibits no distension. There is no tenderness.  Lymphadenopathy: She has no cervical adenopathy.  Skin: Skin is warm and dry. She is not diaphoretic.  Psychiatric: She has a normal mood and affect. Her behavior is normal.        Assessment & Plan:   Physical exam: Screening blood work    ordered Immunizations  Declined covid booster, discussed shingrix Colonoscopy  Up to date  Mammogram  Up to date  Gyn  - up to date Dexa  Due - ordered Eye exams  Up to date  Exercise  Walking regularly Weight  normal Substance abuse  none      See Problem List for Assessment and Plan of chronic medical problems.

## 2021-04-12 NOTE — Patient Instructions (Addendum)
nurtec 75 mg once as needed for migraine  ubrelvy 100 mg once as needed for migraine, may repeat x 1 after 2 hrs if needed    Blood work was ordered.     Medications changes include :   Try migraine samples  Your prescription(s) have been submitted to your pharmacy. Please take as directed and contact our office if you believe you are having problem(s) with the medication(s).   Please followup in 1 year    Health Maintenance, Female Adopting a healthy lifestyle and getting preventive care are important in promoting health and wellness. Ask your health care provider about:  The right schedule for you to have regular tests and exams.  Things you can do on your own to prevent diseases and keep yourself healthy. What should I know about diet, weight, and exercise? Eat a healthy diet  Eat a diet that includes plenty of vegetables, fruits, low-fat dairy products, and lean protein.  Do not eat a lot of foods that are high in solid fats, added sugars, or sodium.   Maintain a healthy weight Body mass index (BMI) is used to identify weight problems. It estimates body fat based on height and weight. Your health care provider can help determine your BMI and help you achieve or maintain a healthy weight. Get regular exercise Get regular exercise. This is one of the most important things you can do for your health. Most adults should:  Exercise for at least 150 minutes each week. The exercise should increase your heart rate and make you sweat (moderate-intensity exercise).  Do strengthening exercises at least twice a week. This is in addition to the moderate-intensity exercise.  Spend less time sitting. Even light physical activity can be beneficial. Watch cholesterol and blood lipids Have your blood tested for lipids and cholesterol at 65 years of age, then have this test every 5 years. Have your cholesterol levels checked more often if:  Your lipid or cholesterol levels are  high.  You are older than 65 years of age.  You are at high risk for heart disease. What should I know about cancer screening? Depending on your health history and family history, you may need to have cancer screening at various ages. This may include screening for:  Breast cancer.  Cervical cancer.  Colorectal cancer.  Skin cancer.  Lung cancer. What should I know about heart disease, diabetes, and high blood pressure? Blood pressure and heart disease  High blood pressure causes heart disease and increases the risk of stroke. This is more likely to develop in people who have high blood pressure readings, are of African descent, or are overweight.  Have your blood pressure checked: ? Every 3-5 years if you are 88-48 years of age. ? Every year if you are 52 years old or older. Diabetes Have regular diabetes screenings. This checks your fasting blood sugar level. Have the screening done:  Once every three years after age 2 if you are at a normal weight and have a low risk for diabetes.  More often and at a younger age if you are overweight or have a high risk for diabetes. What should I know about preventing infection? Hepatitis B If you have a higher risk for hepatitis B, you should be screened for this virus. Talk with your health care provider to find out if you are at risk for hepatitis B infection. Hepatitis C Testing is recommended for:  Everyone born from [birth year] through 1965.  Anyone with known  risk factors for hepatitis C. Sexually transmitted infections (STIs)  Get screened for STIs, including gonorrhea and chlamydia, if: ? You are sexually active and are younger than 65 years of age. ? You are older than 65 years of age and your health care provider tells you that you are at risk for this type of infection. ? Your sexual activity has changed since you were last screened, and you are at increased risk for chlamydia or gonorrhea. Ask your health care provider if you  are at risk.  Ask your health care provider about whether you are at high risk for HIV. Your health care provider may recommend a prescription medicine to help prevent HIV infection. If you choose to take medicine to prevent HIV, you should first get tested for HIV. You should then be tested every 3 months for as long as you are taking the medicine. Pregnancy  If you are about to stop having your period (premenopausal) and you may become pregnant, seek counseling before you get pregnant.  Take 400 to 800 micrograms (mcg) of folic acid every day if you become pregnant.  Ask for birth control (contraception) if you want to prevent pregnancy. Osteoporosis and menopause Osteoporosis is a disease in which the bones lose minerals and strength with aging. This can result in bone fractures. If you are 78 years old or older, or if you are at risk for osteoporosis and fractures, ask your health care provider if you should:  Be screened for bone loss.  Take a calcium or vitamin D supplement to lower your risk of fractures.  Be given hormone replacement therapy (HRT) to treat symptoms of menopause. Follow these instructions at home: Lifestyle  Do not use any products that contain nicotine or tobacco, such as cigarettes, e-cigarettes, and chewing tobacco. If you need help quitting, ask your health care provider.  Do not use street drugs.  Do not share needles.  Ask your health care provider for help if you need support or information about quitting drugs. Alcohol use  Do not drink alcohol if: ? Your health care provider tells you not to drink. ? You are pregnant, may be pregnant, or are planning to become pregnant.  If you drink alcohol: ? Limit how much you use to 0-1 drink a day. ? Limit intake if you are breastfeeding.  Be aware of how much alcohol is in your drink. In the [place] one drink equals one 12 oz bottle of beer (355 mL), one 5 oz glass of wine (148 mL), or one 1 oz glass of hard  liquor (44 mL). General instructions  Schedule regular health, dental, and eye exams.  Stay current with your vaccines.  Tell your health care provider if: ? You often feel depressed. ? You have ever been abused or do not feel safe at home. Summary  Adopting a healthy lifestyle and getting preventive care are important in promoting health and wellness.  Follow your health care provider's instructions about healthy diet, exercising, and getting tested or screened for diseases.  Follow your health care provider's instructions on monitoring your cholesterol and blood pressure. This information is not intended to replace advice given to you by your health care provider. Make sure you discuss any questions you have with your health care provider. Document Revised: 10/28/2018 Document Reviewed: 10/28/2018 Elsevier Patient Education  2021 [employer].

## 2021-04-13 ENCOUNTER — Encounter: Payer: Self-pay | Admitting: Internal Medicine

## 2021-04-13 ENCOUNTER — Other Ambulatory Visit: Payer: Self-pay

## 2021-04-13 ENCOUNTER — Ambulatory Visit (INDEPENDENT_AMBULATORY_CARE_PROVIDER_SITE_OTHER): Payer: BC Managed Care – PPO | Admitting: Internal Medicine

## 2021-04-13 VITALS — BP 128/78 | HR 67 | Temp 98.5°F | Ht 64.0 in | Wt 143.0 lb

## 2021-04-13 DIAGNOSIS — Z0000 Encounter for general adult medical examination without abnormal findings: Secondary | ICD-10-CM | POA: Diagnosis not present

## 2021-04-13 DIAGNOSIS — E7849 Other hyperlipidemia: Secondary | ICD-10-CM

## 2021-04-13 DIAGNOSIS — E038 Other specified hypothyroidism: Secondary | ICD-10-CM

## 2021-04-13 DIAGNOSIS — G43809 Other migraine, not intractable, without status migrainosus: Secondary | ICD-10-CM

## 2021-04-13 DIAGNOSIS — K219 Gastro-esophageal reflux disease without esophagitis: Secondary | ICD-10-CM | POA: Diagnosis not present

## 2021-04-13 DIAGNOSIS — Z1159 Encounter for screening for other viral diseases: Secondary | ICD-10-CM

## 2021-04-13 DIAGNOSIS — M8588 Other specified disorders of bone density and structure, other site: Secondary | ICD-10-CM | POA: Diagnosis not present

## 2021-04-13 DIAGNOSIS — Z833 Family history of diabetes mellitus: Secondary | ICD-10-CM

## 2021-04-13 DIAGNOSIS — G4733 Obstructive sleep apnea (adult) (pediatric): Secondary | ICD-10-CM

## 2021-04-13 DIAGNOSIS — F3289 Other specified depressive episodes: Secondary | ICD-10-CM

## 2021-04-13 LAB — COMPREHENSIVE METABOLIC PANEL
ALT: 14 U/L (ref 0–35)
AST: 18 U/L (ref 0–37)
Albumin: 4.4 g/dL (ref 3.5–5.2)
Alkaline Phosphatase: 64 U/L (ref 39–117)
BUN: 22 mg/dL (ref 6–23)
CO2: 29 mEq/L (ref 19–32)
Calcium: 9.5 mg/dL (ref 8.4–10.5)
Chloride: 103 mEq/L (ref 96–112)
Creatinine, Ser: 0.65 mg/dL (ref 0.40–1.20)
GFR: 92.77 mL/min (ref >60.00)
Glucose, Bld: 75 mg/dL (ref 70–99)
Potassium: 3.9 mEq/L (ref 3.5–5.1)
Sodium: 139 mEq/L (ref 135–145)
Total Bilirubin: 0.5 mg/dL (ref 0.2–1.2)
Total Protein: 6.9 g/dL (ref 6.0–8.3)

## 2021-04-13 LAB — CBC WITH DIFFERENTIAL/PLATELET
Basophils Absolute: 0 10*3/uL (ref 0.0–0.1)
Basophils Relative: 0.6 % (ref 0.0–3.0)
Eosinophils Absolute: 0.1 10*3/uL (ref 0.0–0.7)
Eosinophils Relative: 1 % (ref 0.0–5.0)
HCT: 42.4 % (ref 36.0–46.0)
Hemoglobin: 14 g/dL (ref 12.0–15.0)
Lymphocytes Relative: 23.1 % (ref 12.0–46.0)
Lymphs Abs: 1.3 10*3/uL (ref 0.7–4.0)
MCHC: 33.1 g/dL (ref 30.0–36.0)
MCV: 84.1 fl (ref 78.0–100.0)
Monocytes Absolute: 0.4 10*3/uL (ref 0.1–1.0)
Monocytes Relative: 6.8 % (ref 3.0–12.0)
Neutro Abs: 3.8 10*3/uL (ref 1.4–7.7)
Neutrophils Relative %: 68.5 % (ref 43.0–77.0)
Platelets: 148 10*3/uL — ABNORMAL LOW (ref 150.0–400.0)
RBC: 5.04 Mil/uL (ref 3.87–5.11)
RDW: 13.8 % (ref 11.5–15.5)
WBC: 5.6 10*3/uL (ref 4.0–10.5)

## 2021-04-13 LAB — LDL CHOLESTEROL, DIRECT: Direct LDL: 168 mg/dL

## 2021-04-13 LAB — HEMOGLOBIN A1C: Hgb A1c MFr Bld: 5.9 % (ref 4.6–6.5)

## 2021-04-13 LAB — LIPID PANEL
Cholesterol: 243 mg/dL — ABNORMAL HIGH (ref 0–200)
HDL: 52 mg/dL (ref >39.00)
NonHDL: 191.14
Total CHOL/HDL Ratio: 5
Triglycerides: 228 mg/dL — ABNORMAL HIGH (ref 0.0–149.0)
VLDL: 45.6 mg/dL — ABNORMAL HIGH (ref 0.0–40.0)

## 2021-04-13 LAB — VITAMIN D 25 HYDROXY (VIT D DEFICIENCY, FRACTURES): VITD: 32.02 ng/mL (ref 30.00–100.00)

## 2021-04-13 LAB — TSH: TSH: 2.04 u[IU]/mL (ref 0.35–4.50)

## 2021-04-13 MED ORDER — FAMOTIDINE 40 MG PO TABS: 40.0000 mg | ORAL_TABLET | Freq: Every day | ORAL | 3 refills | Status: DC

## 2021-04-13 NOTE — Assessment & Plan Note (Signed)
Chronic Controlled, stable Continue paxil 20 mg daily  

## 2021-04-13 NOTE — Assessment & Plan Note (Addendum)
Chronic Follows at the headache center Getting botox injections Takes imitrx 100 mg prn and zofran prn - the imitrex does not make her feel well 1 ubrelvy sample given 2 nurtec samples given for her to try

## 2021-04-13 NOTE — Assessment & Plan Note (Addendum)
Both parents had DM Check a1c Low sugar / carb diet Stressed regular exercise

## 2021-04-13 NOTE — Assessment & Plan Note (Addendum)
Chronic GERD not controlled Continue prevacid 30 mg daily Start pepcid 40 mg daily in evening

## 2021-04-13 NOTE — Assessment & Plan Note (Signed)
Chronic Check lipid panel, cmp  Diet controlled Regular exercise and healthy diet encouraged

## 2021-04-13 NOTE — Assessment & Plan Note (Signed)
Chronic  Clinically euthyroid Currently taking levothyroxine 50 mcg daily Check tsh  Titrate med dose if needed  

## 2021-04-14 NOTE — Assessment & Plan Note (Signed)
Chronic Uses cpap nightly 

## 2021-04-14 NOTE — Assessment & Plan Note (Signed)
Chronic dexa due - will order Walking regularly Taking calcium and vitamin d Check vitamin d level

## 2021-04-17 ENCOUNTER — Other Ambulatory Visit: Payer: Self-pay

## 2021-04-17 ENCOUNTER — Ambulatory Visit (INDEPENDENT_AMBULATORY_CARE_PROVIDER_SITE_OTHER)
Admission: RE | Admit: 2021-04-17 | Discharge: 2021-04-17 | Disposition: A | Discharge status: Home / Self Care | Payer: BC Managed Care – PPO | Source: Ambulatory Visit | Attending: Internal Medicine | Admitting: Internal Medicine

## 2021-04-17 DIAGNOSIS — M8588 Other specified disorders of bone density and structure, other site: Secondary | ICD-10-CM | POA: Diagnosis not present

## 2021-04-17 LAB — HEPATITIS C ANTIBODY
Hepatitis C Ab: NONREACTIVE
SIGNAL TO CUT-OFF: 0 (ref <1.00)

## 2021-04-19 ENCOUNTER — Telehealth: Payer: Self-pay

## 2021-04-19 ENCOUNTER — Encounter: Payer: Self-pay | Admitting: Internal Medicine

## 2021-04-19 NOTE — Telephone Encounter (Signed)
Error

## 2021-04-22 DIAGNOSIS — M8588 Other specified disorders of bone density and structure, other site: Secondary | ICD-10-CM | POA: Diagnosis not present

## 2021-04-25 ENCOUNTER — Encounter (HOSPITAL_COMMUNITY): Payer: Self-pay

## 2021-04-25 ENCOUNTER — Ambulatory Visit (HOSPITAL_COMMUNITY): Admission: RE | Admit: 2021-04-25 | Payer: BC Managed Care – PPO | Source: Ambulatory Visit

## 2021-04-30 DIAGNOSIS — T1581XA Foreign body in other and multiple parts of external eye, right eye, initial encounter: Secondary | ICD-10-CM | POA: Diagnosis not present

## 2021-05-01 DIAGNOSIS — G43719 Chronic migraine without aura, intractable, without status migrainosus: Secondary | ICD-10-CM | POA: Diagnosis not present

## 2021-05-01 DIAGNOSIS — M542 Cervicalgia: Secondary | ICD-10-CM | POA: Diagnosis not present

## 2021-05-10 DIAGNOSIS — M542 Cervicalgia: Secondary | ICD-10-CM | POA: Diagnosis not present

## 2021-05-10 DIAGNOSIS — G43719 Chronic migraine without aura, intractable, without status migrainosus: Secondary | ICD-10-CM | POA: Diagnosis not present

## 2021-05-14 ENCOUNTER — Telehealth: Payer: Self-pay | Admitting: Gastroenterology

## 2021-05-14 ENCOUNTER — Ambulatory Visit (HOSPITAL_COMMUNITY): Admission: RE | Admit: 2021-05-14 | Payer: BC Managed Care – PPO | Source: Ambulatory Visit

## 2021-05-14 NOTE — Telephone Encounter (Signed)
Pt called to cancel UGI at [facility] today because she got up with a migrane. She will call back to r/s.

## 2021-05-14 NOTE — Telephone Encounter (Signed)
Noted pt appt cancelled per patient preference

## 2021-05-23 ENCOUNTER — Other Ambulatory Visit: Payer: Self-pay | Admitting: Internal Medicine

## 2021-05-29 DIAGNOSIS — N760 Acute vaginitis: Secondary | ICD-10-CM | POA: Diagnosis not present

## 2021-05-29 DIAGNOSIS — R82998 Other abnormal findings in urine: Secondary | ICD-10-CM | POA: Diagnosis not present

## 2021-05-29 DIAGNOSIS — N950 Postmenopausal bleeding: Secondary | ICD-10-CM | POA: Diagnosis not present

## 2021-06-13 ENCOUNTER — Encounter: Payer: Self-pay | Admitting: Internal Medicine

## 2021-06-15 ENCOUNTER — Encounter: Payer: Self-pay | Admitting: Internal Medicine

## 2021-06-15 MED ORDER — UBRELVY 100 MG PO TABS: ORAL_TABLET | ORAL | 8 refills | Status: DC

## 2021-06-18 MED ORDER — OMEPRAZOLE 40 MG PO CPDR: 40.0000 mg | DELAYED_RELEASE_CAPSULE | Freq: Every day | ORAL | 3 refills | Status: DC

## 2021-06-18 NOTE — Addendum Note (Signed)
Addended by: Binnie Rail on: 06/18/2021 07:46 AM   Modules accepted: Orders

## 2021-06-24 ENCOUNTER — Other Ambulatory Visit: Payer: Self-pay | Admitting: Internal Medicine

## 2021-06-26 ENCOUNTER — Other Ambulatory Visit: Payer: Self-pay | Admitting: Internal Medicine

## 2021-06-27 ENCOUNTER — Other Ambulatory Visit: Payer: Self-pay | Admitting: Internal Medicine

## 2021-06-27 DIAGNOSIS — L603 Nail dystrophy: Secondary | ICD-10-CM | POA: Diagnosis not present

## 2021-06-27 DIAGNOSIS — N3946 Mixed incontinence: Secondary | ICD-10-CM | POA: Diagnosis not present

## 2021-06-27 DIAGNOSIS — L821 Other seborrheic keratosis: Secondary | ICD-10-CM | POA: Diagnosis not present

## 2021-06-27 DIAGNOSIS — N812 Incomplete uterovaginal prolapse: Secondary | ICD-10-CM | POA: Diagnosis not present

## 2021-06-27 DIAGNOSIS — N393 Stress incontinence (female) (male): Secondary | ICD-10-CM | POA: Diagnosis not present

## 2021-06-27 MED ORDER — PAROXETINE HCL 20 MG PO TABS: 20.0000 mg | ORAL_TABLET | Freq: Every day | ORAL | 5 refills | Status: DC

## 2021-07-03 DIAGNOSIS — D252 Subserosal leiomyoma of uterus: Secondary | ICD-10-CM | POA: Diagnosis not present

## 2021-07-03 DIAGNOSIS — N812 Incomplete uterovaginal prolapse: Secondary | ICD-10-CM | POA: Diagnosis not present

## 2021-07-03 DIAGNOSIS — D251 Intramural leiomyoma of uterus: Secondary | ICD-10-CM | POA: Diagnosis not present

## 2021-07-03 DIAGNOSIS — D282 Benign neoplasm of uterine tubes and ligaments: Secondary | ICD-10-CM | POA: Diagnosis not present

## 2021-07-03 DIAGNOSIS — N398 Other specified disorders of urinary system: Secondary | ICD-10-CM | POA: Diagnosis not present

## 2021-07-03 DIAGNOSIS — N393 Stress incontinence (female) (male): Secondary | ICD-10-CM | POA: Diagnosis not present

## 2021-07-03 DIAGNOSIS — N840 Polyp of corpus uteri: Secondary | ICD-10-CM | POA: Diagnosis not present

## 2021-07-03 DIAGNOSIS — D259 Leiomyoma of uterus, unspecified: Secondary | ICD-10-CM | POA: Diagnosis not present

## 2021-07-03 DIAGNOSIS — N83292 Other ovarian cyst, left side: Secondary | ICD-10-CM | POA: Diagnosis not present

## 2021-07-04 DIAGNOSIS — D252 Subserosal leiomyoma of uterus: Secondary | ICD-10-CM | POA: Diagnosis not present

## 2021-07-04 DIAGNOSIS — D282 Benign neoplasm of uterine tubes and ligaments: Secondary | ICD-10-CM | POA: Diagnosis not present

## 2021-07-04 DIAGNOSIS — N393 Stress incontinence (female) (male): Secondary | ICD-10-CM | POA: Diagnosis not present

## 2021-07-04 DIAGNOSIS — N398 Other specified disorders of urinary system: Secondary | ICD-10-CM | POA: Diagnosis not present

## 2021-07-04 DIAGNOSIS — N812 Incomplete uterovaginal prolapse: Secondary | ICD-10-CM | POA: Diagnosis not present

## 2021-07-05 ENCOUNTER — Telehealth: Payer: Self-pay

## 2021-07-05 NOTE — Telephone Encounter (Signed)
Left message for patient today.  If BP continues to remain elevated, if she has any headaches, change with vision, chest pain or pressure, tightness or pain in her arm she will need to report to ED for further eval and treatment.

## 2021-07-05 NOTE — Telephone Encounter (Signed)
pt states she had a hysterectomy x2 days ago. Pt states her BP has been elevated since. Pts last BP reading was 199/103. Pt states her Urologist has informed her she needs to be seen by her PCP and be out on BP meds.  **Pt is set up with Dr.Plotnikov as vv for tomorrow 07/06/2021 at 12:40pm.

## 2021-07-06 ENCOUNTER — Telehealth (INDEPENDENT_AMBULATORY_CARE_PROVIDER_SITE_OTHER): Payer: Medicare HMO | Admitting: Internal Medicine

## 2021-07-06 ENCOUNTER — Other Ambulatory Visit: Payer: Self-pay

## 2021-07-06 DIAGNOSIS — R030 Elevated blood-pressure reading, without diagnosis of hypertension: Secondary | ICD-10-CM | POA: Diagnosis not present

## 2021-07-06 DIAGNOSIS — G43809 Other migraine, not intractable, without status migrainosus: Secondary | ICD-10-CM | POA: Diagnosis not present

## 2021-07-06 DIAGNOSIS — F3289 Other specified depressive episodes: Secondary | ICD-10-CM

## 2021-07-06 MED ORDER — PROPRANOLOL HCL 10 MG PO TABS: 10.0000 mg | ORAL_TABLET | Freq: Two times a day (BID) | ORAL | 5 refills | Status: DC

## 2021-07-06 NOTE — Progress Notes (Signed)
Virtual Visit via Video Note  I connected with Tracie Hart on 07/06/21 at 12:40 PM EDT by a video enabled telemedicine application and verified that I am speaking with the correct person using two identifiers.   I discussed the limitations of evaluation and management by telemedicine and the availability of in person appointments. The patient expressed understanding and agreed to proceed.  I was located at our [facility] office. The patient was at home. There was no one else present in the visit.   History of Present Illness: C/o elevated BP in the [id number] range for systolic blood pressure, HAs daily-migraines following her surgery She is recovering after her hysterectomy surgery that she had on Tue of this week. She is taking paroxetine for depression   Observations/Objective: The patient appears to be in no acute distress, looks normal.  Assessment and Plan:  See my Assessment and Plan. Follow Up Instructions:    I discussed the assessment and treatment plan with the patient. The patient was provided an opportunity to ask questions and all were answered. The patient agreed with the plan and demonstrated an understanding of the instructions.   The patient was advised to call back or seek an in-person evaluation if the symptoms worsen or if the condition fails to improve as anticipated.  I provided face-to-face time during this encounter. We were at different locations.   Walker Kehr, MD

## 2021-07-08 ENCOUNTER — Encounter: Payer: Self-pay | Admitting: Internal Medicine

## 2021-07-08 NOTE — Assessment & Plan Note (Signed)
The patient will continue with Imitrex as needed.  She is bothered with some side effects with Imitrex, but does not wish to change to something else.

## 2021-07-08 NOTE — Assessment & Plan Note (Signed)
Blood pressure elevation could be related to her recent daily headaches and the stress of surgery.  We gave the patient propranolol 10 mg to take twice a day for systolic blood pressure remains greater than 150.  Propranolol may help with migraine headache prophylaxis.  She will follow-up with Dr. Quay Burow in a couple weeks.  No added salt diet

## 2021-07-08 NOTE — Assessment & Plan Note (Signed)
Continue with paroxetine at 20 mg daily.

## 2021-07-11 ENCOUNTER — Other Ambulatory Visit: Payer: Self-pay | Admitting: Internal Medicine

## 2021-07-11 DIAGNOSIS — K219 Gastro-esophageal reflux disease without esophagitis: Secondary | ICD-10-CM

## 2021-07-12 ENCOUNTER — Encounter: Payer: Self-pay | Admitting: Internal Medicine

## 2021-07-30 DIAGNOSIS — Z0100 Encounter for examination of eyes and vision without abnormal findings: Secondary | ICD-10-CM | POA: Diagnosis not present

## 2021-08-15 DIAGNOSIS — Z9889 Other specified postprocedural states: Secondary | ICD-10-CM | POA: Diagnosis not present

## 2021-08-15 DIAGNOSIS — N393 Stress incontinence (female) (male): Secondary | ICD-10-CM | POA: Diagnosis not present

## 2021-08-24 ENCOUNTER — Other Ambulatory Visit: Payer: Self-pay | Admitting: Internal Medicine

## 2021-08-24 DIAGNOSIS — Z853 Personal history of malignant neoplasm of breast: Secondary | ICD-10-CM

## 2021-08-24 DIAGNOSIS — Z1231 Encounter for screening mammogram for malignant neoplasm of breast: Secondary | ICD-10-CM

## 2021-09-18 ENCOUNTER — Encounter: Payer: Self-pay | Admitting: Internal Medicine

## 2021-09-18 DIAGNOSIS — R7303 Prediabetes: Secondary | ICD-10-CM | POA: Insufficient documentation

## 2021-09-18 DIAGNOSIS — E038 Other specified hypothyroidism: Secondary | ICD-10-CM

## 2021-09-18 DIAGNOSIS — E7849 Other hyperlipidemia: Secondary | ICD-10-CM

## 2021-09-25 ENCOUNTER — Other Ambulatory Visit (INDEPENDENT_AMBULATORY_CARE_PROVIDER_SITE_OTHER): Payer: Medicare HMO

## 2021-09-25 ENCOUNTER — Other Ambulatory Visit: Payer: Self-pay

## 2021-09-25 DIAGNOSIS — R7303 Prediabetes: Secondary | ICD-10-CM

## 2021-09-25 DIAGNOSIS — E038 Other specified hypothyroidism: Secondary | ICD-10-CM

## 2021-09-25 DIAGNOSIS — E7849 Other hyperlipidemia: Secondary | ICD-10-CM

## 2021-09-25 LAB — COMPREHENSIVE METABOLIC PANEL
ALT: 18 U/L (ref 0–35)
AST: 22 U/L (ref 0–37)
Albumin: 4.2 g/dL (ref 3.5–5.2)
Alkaline Phosphatase: 58 U/L (ref 39–117)
BUN: 20 mg/dL (ref 6–23)
CO2: 28 mEq/L (ref 19–32)
Calcium: 9.4 mg/dL (ref 8.4–10.5)
Chloride: 103 mEq/L (ref 96–112)
Creatinine, Ser: 0.78 mg/dL (ref 0.40–1.20)
GFR: 79.78 mL/min (ref >60.00)
Glucose, Bld: 70 mg/dL (ref 70–99)
Potassium: 3.8 mEq/L (ref 3.5–5.1)
Sodium: 140 mEq/L (ref 135–145)
Total Bilirubin: 0.6 mg/dL (ref 0.2–1.2)
Total Protein: 6.6 g/dL (ref 6.0–8.3)

## 2021-09-25 LAB — LIPID PANEL
Cholesterol: 175 mg/dL (ref 0–200)
HDL: 41.9 mg/dL (ref >39.00)
LDL Cholesterol: 104 mg/dL — ABNORMAL HIGH (ref 0–99)
NonHDL: 133.41
Total CHOL/HDL Ratio: 4
Triglycerides: 147 mg/dL (ref 0.0–149.0)
VLDL: 29.4 mg/dL (ref 0.0–40.0)

## 2021-09-25 LAB — TSH: TSH: 2.7 u[IU]/mL (ref 0.35–5.50)

## 2021-09-25 LAB — HEMOGLOBIN A1C: Hgb A1c MFr Bld: 5.7 % (ref 4.6–6.5)

## 2021-09-30 NOTE — Patient Instructions (Addendum)
   Medications changes include :   none    Please followup in 6 months   

## 2021-09-30 NOTE — Progress Notes (Signed)
Subjective:    Patient ID: Tracie Hart, female    DOB: [birth year], 65 y.o.   MRN: [id number]  This visit occurred during the SARS-CoV-2 public health emergency.  Safety protocols were in place, including screening questions prior to the visit, additional usage of staff PPE, and extensive cleaning of exam room while observing appropriate contact time as indicated for disinfecting solutions.     HPI The patient is here for follow up of their chronic medical problems, including migraines, gerd, depression, hypothyroid, hld, prediabetes  She is walking.  She has cut down on her red meat, ice cream   She has poison ivy - on forehead, chest, face, neck. She thinks it is getting better.  She would like to avoid steroids.   Medications and allergies reviewed with patient and updated if appropriate.  Patient Active Problem List   Diagnosis Date Noted   Prediabetes 09/18/2021   Nausea 07/09/2019   Difficulty sleeping 07/09/2019   Left lower quadrant abdominal pain 12/30/2018   Malignant neoplasm of upper-outer quadrant of right female breast ([place]) 12/15/2018   Ductal carcinoma in situ (DCIS) of right breast 10/08/2018   Osteopenia 09/08/2018   Loose stools 07/28/2018   Dizziness 03/17/2018   Pain of upper abdomen 03/17/2018   Chronic pain of left knee 03/17/2018   Multiple lipomas 01/05/2018   Hypothyroidism 11/03/2017   Depression 11/03/2017   Family history of diabetes mellitus (DM) 11/03/2017   Migraines 11/03/2017   GERD (gastroesophageal reflux disease) 11/03/2017   OSA (obstructive sleep apnea) 11/03/2017   Elevated blood pressure, situational 11/03/2017   Hyperlipidemia 11/03/2017   Overactive bladder 11/03/2017    Current Outpatient Medications on File Prior to Visit  Medication Sig Dispense Refill   calcium-vitamin D (OSCAL WITH D) 500-200 MG-UNIT tablet Take 1 tablet by mouth.     COLLAGEN PO Take by mouth.     famotidine (PEPCID) 40 MG tablet Take 1  tablet (40 mg total) by mouth daily. 90 tablet 3   levothyroxine (SYNTHROID) 50 MCG tablet TAKE 1 TABLET BY MOUTH DAILY BEFORE BREAKFAST 90 tablet 0   Magnesium 400 MG TABS Take 200 mg by mouth daily.      omeprazole (PRILOSEC) 40 MG capsule Take 1 capsule (40 mg total) by mouth daily. 90 capsule 3   PARoxetine (PAXIL) 20 MG tablet Take 1 tablet (20 mg total) by mouth daily. 30 tablet 5   propranolol (INDERAL) 10 MG tablet Take 1 tablet (10 mg total) by mouth 2 (two) times daily. Start if SBP>150 60 tablet 5   SUMAtriptan (IMITREX) 100 MG tablet Take 1 tablet (100 mg total) by mouth every 2 (two) hours as needed for migraine. May repeat in 2 hours if headache persists or recurs. 10 tablet 8   Turmeric (QC TUMERIC COMPLEX PO) Take by mouth daily in the afternoon.     Ubrogepant (UBRELVY) 100 MG TABS Take one pill as needed for migraine.  May repeat x 1 after 2 hrs if needed. 10 tablet 8   vitamin B-12 (CYANOCOBALAMIN) 500 MCG tablet Take 1,000 mcg by mouth daily.      ibuprofen (ADVIL) 600 MG tablet Take by mouth.     No current facility-administered medications on file prior to visit.    Past Medical History:  Diagnosis Date   Allergy    Anemia    Anxiety    Cancer ([place])    breast cancer   Depression    GERD (gastroesophageal reflux  disease)    Hypertension    Migraine    Migraine    Osteopenia    Sleep apnea    Sleep apnea with use of continuous positive airway pressure (CPAP)    Thyroid disease     Past Surgical History:  Procedure Laterality Date   BREAST LUMPECTOMY Right 10/22/2018   BREAST LUMPECTOMY WITH RADIOACTIVE SEED LOCALIZATION Right 10/22/2018   Procedure: BREAST LUMPECTOMY WITH RADIOACTIVE SEED LOCALIZATION;  Surgeon: Excell Seltzer, MD;  Location: [place];  Service: General;  Laterality: Right;   COLONOSCOPY     GALLBLADDER SURGERY     KNEE ARTHROSCOPY Left    NEUROFIBROMA EXCISION  1986   PALATE / UVULA BIOPSY / EXCISION      Social  History   Socioeconomic History   Marital status: Divorced    Spouse name: Not on file   Number of children: 1   Years of education: Not on file   Highest education level: Not on file  Occupational History   Occupation: retired  Tobacco Use   Smoking status: Never   Smokeless tobacco: Never  Vaping Use   Vaping Use: Never used  Substance and Sexual Activity   Alcohol use: Yes    Comment: rarely   Drug use: No   Sexual activity: Never  Other Topics Concern   Not on file  Social History Narrative   No regular exercise   Social Determinants of Health   Financial Resource Strain: Not on file  Food Insecurity: Not on file  Transportation Needs: Not on file  Physical Activity: Not on file  Stress: Not on file  Social Connections: Not on file    Family History  Problem Relation Age of Onset   Arthritis Mother    Diabetes Mother    Heart disease Mother    Hypertension Mother    Stroke Mother    Arthritis Father    Depression Father    Diabetes Father    Hyperlipidemia Father    Hypertension Father    Depression Brother    Hypertension Brother    Heart disease Maternal Grandmother    COPD Paternal Grandmother    COPD Paternal Grandfather    Colon cancer Paternal Grandfather    Colon polyps Neg Hx    Esophageal cancer Neg Hx    Stomach cancer Neg Hx    Rectal cancer Neg Hx     Review of Systems  Constitutional:  Negative for chills and fever.  Respiratory:  Negative for cough, shortness of breath and wheezing.   Cardiovascular:  Negative for chest pain, palpitations and leg swelling.  Neurological:  Positive for headaches. Negative for dizziness and light-headedness.      Objective:   Vitals:   10/01/21 0858  BP: 124/86  Pulse: 72  Temp: 98 F (36.7 C)  SpO2: 98%   BP Readings from Last 3 Encounters:  10/01/21 124/86  04/13/21 128/78  03/30/21 (!) 155/84   Wt Readings from Last 3 Encounters:  10/01/21 138 lb (62.6 kg)  04/13/21 143 lb (64.9 kg)   03/30/21 143 lb (64.9 kg)   Body mass index is 23.69 kg/m.   Physical Exam    Constitutional: Appears well-developed and well-nourished. No distress.  HENT:  Head: Normocephalic and atraumatic.  Neck: Neck supple. No tracheal deviation present. No thyromegaly present.  No cervical lymphadenopathy Cardiovascular: Normal rate, regular rhythm and normal heart sounds.   No murmur heard. No carotid bruit .  No edema Pulmonary/Chest: Effort  normal and breath sounds normal. No respiratory distress. No has no wheezes. No rales.  Skin: Skin is warm and dry. Not diaphoretic.  Psychiatric: Normal mood and affect. Behavior is normal.      Assessment & Plan:    See Problem List for Assessment and Plan of chronic medical problems.

## 2021-10-01 ENCOUNTER — Ambulatory Visit (INDEPENDENT_AMBULATORY_CARE_PROVIDER_SITE_OTHER): Payer: Medicare HMO | Admitting: Internal Medicine

## 2021-10-01 ENCOUNTER — Encounter: Payer: Self-pay | Admitting: Internal Medicine

## 2021-10-01 ENCOUNTER — Other Ambulatory Visit: Payer: Self-pay

## 2021-10-01 ENCOUNTER — Other Ambulatory Visit: Payer: Self-pay | Admitting: Internal Medicine

## 2021-10-01 VITALS — BP 124/86 | HR 72 | Temp 98.0°F | Ht 64.0 in | Wt 138.0 lb

## 2021-10-01 DIAGNOSIS — E7849 Other hyperlipidemia: Secondary | ICD-10-CM | POA: Diagnosis not present

## 2021-10-01 DIAGNOSIS — R7303 Prediabetes: Secondary | ICD-10-CM | POA: Diagnosis not present

## 2021-10-01 DIAGNOSIS — E038 Other specified hypothyroidism: Secondary | ICD-10-CM | POA: Diagnosis not present

## 2021-10-01 DIAGNOSIS — G43809 Other migraine, not intractable, without status migrainosus: Secondary | ICD-10-CM | POA: Diagnosis not present

## 2021-10-01 DIAGNOSIS — K219 Gastro-esophageal reflux disease without esophagitis: Secondary | ICD-10-CM

## 2021-10-01 DIAGNOSIS — F3289 Other specified depressive episodes: Secondary | ICD-10-CM | POA: Diagnosis not present

## 2021-10-01 NOTE — Assessment & Plan Note (Signed)
Chronic Lab Results  Component Value Date   HGBA1C 5.7 09/25/2021   Sugars overall improved Continue low sugar/carbohydrate diet, regular exercise

## 2021-10-01 NOTE — Assessment & Plan Note (Signed)
Chronic  Clinically euthyroid TSH in normal range Continue levothyroxine 50 mcg daily

## 2021-10-01 NOTE — Assessment & Plan Note (Signed)
Chronic Regular exercise and healthy diet encouraged Lipids much improved Continue lifestyle control

## 2021-10-01 NOTE — Assessment & Plan Note (Signed)
Chronic Controlled, Stable Continue Paxil 20 mg daily

## 2021-10-01 NOTE — Assessment & Plan Note (Addendum)
Chronic Following at the headache center Getting Botox injections Taking Imitrex as needed Has tried [place] - did not work as well as imitrex

## 2021-10-01 NOTE — Assessment & Plan Note (Signed)
Chronic GERD controlled Continue omeprazole 40 mg daily 

## 2021-10-03 DIAGNOSIS — G43719 Chronic migraine without aura, intractable, without status migrainosus: Secondary | ICD-10-CM | POA: Diagnosis not present

## 2021-10-09 ENCOUNTER — Ambulatory Visit: Payer: BC Managed Care – PPO

## 2021-11-08 DIAGNOSIS — G43719 Chronic migraine without aura, intractable, without status migrainosus: Secondary | ICD-10-CM | POA: Diagnosis not present

## 2021-11-08 DIAGNOSIS — M542 Cervicalgia: Secondary | ICD-10-CM | POA: Diagnosis not present

## 2021-11-08 DIAGNOSIS — G518 Other disorders of facial nerve: Secondary | ICD-10-CM | POA: Diagnosis not present

## 2021-11-08 DIAGNOSIS — M791 Myalgia, unspecified site: Secondary | ICD-10-CM | POA: Diagnosis not present

## 2021-11-15 ENCOUNTER — Telehealth: Payer: Medicare HMO | Admitting: Family Medicine

## 2021-11-28 ENCOUNTER — Ambulatory Visit
Admission: RE | Admit: 2021-11-28 | Discharge: 2021-11-28 | Disposition: A | Discharge status: Home / Self Care | Payer: Medicare HMO | Source: Ambulatory Visit | Attending: Internal Medicine | Admitting: Internal Medicine

## 2021-11-28 DIAGNOSIS — Z1231 Encounter for screening mammogram for malignant neoplasm of breast: Secondary | ICD-10-CM | POA: Diagnosis not present

## 2021-11-28 DIAGNOSIS — Z853 Personal history of malignant neoplasm of breast: Secondary | ICD-10-CM

## 2022-01-01 ENCOUNTER — Other Ambulatory Visit: Payer: Self-pay | Admitting: Internal Medicine

## 2022-01-06 ENCOUNTER — Other Ambulatory Visit: Payer: Self-pay | Admitting: Internal Medicine

## 2022-02-07 DIAGNOSIS — M791 Myalgia, unspecified site: Secondary | ICD-10-CM | POA: Diagnosis not present

## 2022-02-07 DIAGNOSIS — G518 Other disorders of facial nerve: Secondary | ICD-10-CM | POA: Diagnosis not present

## 2022-02-07 DIAGNOSIS — M542 Cervicalgia: Secondary | ICD-10-CM | POA: Diagnosis not present

## 2022-02-07 DIAGNOSIS — G43719 Chronic migraine without aura, intractable, without status migrainosus: Secondary | ICD-10-CM | POA: Diagnosis not present

## 2022-03-14 DIAGNOSIS — H5203 Hypermetropia, bilateral: Secondary | ICD-10-CM | POA: Diagnosis not present

## 2022-03-19 DIAGNOSIS — D485 Neoplasm of uncertain behavior of skin: Secondary | ICD-10-CM | POA: Diagnosis not present

## 2022-03-19 DIAGNOSIS — L570 Actinic keratosis: Secondary | ICD-10-CM | POA: Diagnosis not present

## 2022-03-28 ENCOUNTER — Ambulatory Visit (INDEPENDENT_AMBULATORY_CARE_PROVIDER_SITE_OTHER): Payer: Medicare HMO

## 2022-03-28 DIAGNOSIS — Z0000 Encounter for general adult medical examination without abnormal findings: Secondary | ICD-10-CM

## 2022-03-28 NOTE — Progress Notes (Signed)
?I connected with Tracie Hart today by telephone and verified that I am speaking with the correct person using two identifiers. ?Location patient: home ?Location provider: work ?Persons participating in the virtual visit: patient, provider. ?  ?I discussed the limitations, risks, security and privacy concerns of performing an evaluation and management service by telephone and the availability of in person appointments. I also discussed with the patient that there may be a patient responsible charge related to this service. The patient expressed understanding and verbally consented to this telephonic visit.  ?  ?Interactive audio and video telecommunications were attempted between this provider and patient, however failed, due to patient having technical difficulties OR patient did not have access to video capability.  We continued and completed visit with audio only. ? ?Some vital signs may be absent or patient reported.  ? ?Time Spent with patient on telephone encounter: 30 minutes ? ?Subjective:  ? Tracie Hart is a 66 y.o. female who presents for Medicare Annual (Subsequent) preventive examination. ? ?Review of Systems    ? ?Cardiac Risk Factors include: advanced age (>65mn, >>14women);family history of premature cardiovascular disease;hypertension ? ?   ?Objective:  ?  ?There were no vitals filed for this visit. ?There is no height or weight on file to calculate BMI. ? ? ?  03/28/2022  ? 11:35 AM 11/19/2018  ?  2:01 PM 10/22/2018  ?  6:34 AM 10/12/2018  ? 11:37 AM 10/08/2018  ? 12:27 PM  ?Advanced Directives  ?Does Patient Have a Medical Advance Directive? No No No No No  ?Would patient like information on creating a medical advance directive? No - Patient declined  No - Patient declined No - Patient declined   ? ? ?Current Medications (verified) ?Outpatient Encounter Medications as of 03/28/2022  ?Medication Sig  ? calcium-vitamin D (OSCAL WITH D) 500-200 MG-UNIT tablet Take 1 tablet by mouth.  ?  ibuprofen (ADVIL) 600 MG tablet Take by mouth.  ? levothyroxine (SYNTHROID) 50 MCG tablet TAKE 1 TABLET BY MOUTH DAILY BEFORE BREAKFAST  ? Magnesium 400 MG TABS Take 200 mg by mouth daily.   ? omeprazole (PRILOSEC) 40 MG capsule Take 1 capsule (40 mg total) by mouth daily.  ? PARoxetine (PAXIL) 20 MG tablet Take 1 tablet (20 mg total) by mouth daily.  ? SUMAtriptan (IMITREX) 100 MG tablet TAKE 1 TABLET BY MOUTH EVERY 2 HOURS AS NEEDED FOR MIGRAINE. MAY REPEAT IN 2 HOURS IF HEADACHE PERSISTS OR RECURS  ? Turmeric (QC TUMERIC COMPLEX PO) Take by mouth daily in the afternoon.  ? vitamin B-12 (CYANOCOBALAMIN) 500 MCG tablet Take 1,000 mcg by mouth daily.   ? COLLAGEN PO Take by mouth. (Patient not taking: Reported on 03/28/2022)  ? famotidine (PEPCID) 40 MG tablet Take 1 tablet (40 mg total) by mouth daily. (Patient not taking: Reported on 03/28/2022)  ? propranolol (INDERAL) 10 MG tablet Take 1 tablet (10 mg total) by mouth 2 (two) times daily. Start if SBP>150 (Patient not taking: Reported on 03/28/2022)  ? ?No facility-administered encounter medications on file as of 03/28/2022.  ? ? ?Allergies (verified) ?Codeine  ? ?History: ?Past Medical History:  ?Diagnosis Date  ? Allergy   ? Anemia   ? Anxiety   ? Cancer ([facility]   ? breast cancer  ? Depression   ? GERD (gastroesophageal reflux disease)   ? Hypertension   ? Migraine   ? Migraine   ? Osteopenia   ? Sleep apnea   ? Sleep apnea with  use of continuous positive airway pressure (CPAP)   ? Thyroid disease   ? ?Past Surgical History:  ?Procedure Laterality Date  ? BREAST LUMPECTOMY Right 10/22/2018  ? BREAST LUMPECTOMY WITH RADIOACTIVE SEED LOCALIZATION Right 10/22/2018  ? Procedure: BREAST LUMPECTOMY WITH RADIOACTIVE SEED LOCALIZATION;  Surgeon: Excell Seltzer, MD;  Location: [place];  Service: General;  Laterality: Right;  ? COLONOSCOPY    ? GALLBLADDER SURGERY    ? KNEE ARTHROSCOPY Left   ? [place]  ? PALATE / UVULA BIOPSY /  EXCISION    ? ?Family History  ?Problem Relation Age of Onset  ? Arthritis Mother   ? Diabetes Mother   ? Heart disease Mother   ? Hypertension Mother   ? Stroke Mother   ? Arthritis Father   ? Depression Father   ? Diabetes Father   ? Hyperlipidemia Father   ? Hypertension Father   ? Depression Brother   ? Hypertension Brother   ? Heart disease Maternal Grandmother   ? COPD Paternal Grandmother   ? COPD Paternal Grandfather   ? Colon cancer Paternal Grandfather   ? Colon polyps Neg Hx   ? Esophageal cancer Neg Hx   ? Stomach cancer Neg Hx   ? Rectal cancer Neg Hx   ? ?Social History  ? ?Socioeconomic History  ? Marital status: Divorced  ?  Spouse name: Not on file  ? Number of children: 1  ? Years of education: Not on file  ? Highest education level: Not on file  ?Occupational History  ? Occupation: retired  ?Tobacco Use  ? Smoking status: Never  ? Smokeless tobacco: Never  ?Vaping Use  ? Vaping Use: Never used  ?Substance and Sexual Activity  ? Alcohol use: Yes  ?  Comment: rarely  ? Drug use: No  ? Sexual activity: Never  ?Other Topics Concern  ? Not on file  ?Social History Narrative  ? No regular exercise  ? ?Social Determinants of Health  ? ?Financial Resource Strain: Low Risk   ? Difficulty of Paying Living Expenses: Not hard at all  ?Food Insecurity: No Food Insecurity  ? Worried About Charity fundraiser in the Last Year: Never true  ? Ran Out of Food in the Last Year: Never true  ?Transportation Needs: No Transportation Needs  ? Lack of Transportation (Medical): No  ? Lack of Transportation (Non-Medical): No  ?Physical Activity: Sufficiently Active  ? Days of Exercise per Week: 7 days  ? Minutes of Exercise per Session: 30 min  ?Stress: No Stress Concern Present  ? Feeling of Stress : Not at all  ?Social Connections: Moderately Integrated  ? Frequency of Communication with Friends and Family: More than three times a week  ? Frequency of Social Gatherings with Friends and Family: More than three times a  week  ? Attends Religious Services: 1 to 4 times per year  ? Active Member of Clubs or Organizations: Yes  ? Attends Archivist Meetings: 1 to 4 times per year  ? Marital Status: Divorced  ? ? ?Tobacco Counseling ?Counseling given: Not Answered ? ? ?Clinical Intake: ? ?Pre-visit preparation completed: Yes ? ?Pain : No/denies pain ? ?  ? ?Nutritional Risks: None ?Diabetes: No ? ?How often do you need to have someone help you when you read instructions, pamphlets, or other written materials from your doctor or pharmacy?: 1 - Never ?What is the last grade level you completed in school?: Bachelor's Degree ? ?  Diabetic? no ? ?Interpreter Needed?: No ? ?Information entered by :: Lisette Abu, LPN. ? ? ?Activities of Daily Living ? ?  03/28/2022  ? 11:55 AM  ?In your present state of health, do you have any difficulty performing the following activities:  ?Hearing? 1  ?Vision? 0  ?Difficulty concentrating or making decisions? 0  ?Walking or climbing stairs? 0  ?Dressing or bathing? 0  ?Doing errands, shopping? 0  ?Preparing Food and eating ? N  ?Using the Toilet? N  ?In the past six months, have you accidently leaked urine? N  ?Do you have problems with loss of bowel control? N  ?Managing your Medications? N  ?Managing your Finances? N  ?Housekeeping or managing your Housekeeping? N  ? ? ?Patient Care Team: ?Binnie Rail, MD as PCP - General (Internal Medicine) ?Gery Pray, MD as Consulting Physician (Radiation Oncology) ?Nicholas Lose, MD as Consulting Physician (Hematology and Oncology) ?Excell Seltzer, MD (Inactive) as Consulting Physician (General Surgery) ?[place], Heather, [place] as Consulting Physician (Optometry) ? ?Indicate any recent Medical Services you may have received from other than [facility] providers in the past year (date may be approximate). ? ?   ?Assessment:  ? This is a routine wellness examination for [place]. ? ?Hearing/Vision screen ?Hearing Screening - Comments:: Patient has some decreased  hearing. ?No hearing aids. ? ?Vision Screening - Comments:: Patient does wear corrective lenses/contacts.  ?Eye exam done by: Heather [place], OD. ? ? ?Dietary issues and exercise activities discussed: ?Current Exercise H

## 2022-03-28 NOTE — Patient Instructions (Signed)
[name] , ?Thank you for taking time to come for your Medicare Wellness Visit. I appreciate your ongoing commitment to your health goals. Please review the following plan we discussed and let me know if I can assist you in the future.  ? ?Screening recommendations/referrals: ?Colonoscopy: 03/30/2021; due every 7 years ?Mammogram: 11/28/2021; due every year ?Bone Density: 04/17/2021; due every 2 years ?Recommended yearly ophthalmology/optometry visit for glaucoma screening and checkup ?Recommended yearly dental visit for hygiene and checkup ? ?Vaccinations: ?Influenza vaccine: due Fall Season 2023 ?Pneumococcal vaccine: never done ?Tdap vaccine: 04/14/2012; due every 10 years ?Shingles vaccine: never done   ?Covid-19: 06/19/2020, 07/25/2020 ? ?Advanced directives: No ? ?Conditions/risks identified: Yes ? ?Next appointment: Please schedule your next Medicare Wellness Visit with your Nurse Health Advisor in 1 year by calling [phone]. ? ? ?Preventive Care 41 Years and Older, Female ?Preventive care refers to lifestyle choices and visits with your health care provider that can promote health and wellness. ?What does preventive care include? ?A yearly physical exam. This is also called an annual well check. ?Dental exams once or twice a year. ?Routine eye exams. Ask your health care provider how often you should have your eyes checked. ?Personal lifestyle choices, including: ?Daily care of your teeth and gums. ?Regular physical activity. ?Eating a healthy diet. ?Avoiding tobacco and drug use. ?Limiting alcohol use. ?Practicing safe sex. ?Taking low-dose aspirin every day. ?Taking vitamin and mineral supplements as recommended by your health care provider. ?What happens during an annual well check? ?The services and screenings done by your health care provider during your annual well check will depend on your age, overall health, lifestyle risk factors, and family history of disease. ?Counseling  ?Your health care provider  may ask you questions about your: ?Alcohol use. ?Tobacco use. ?Drug use. ?Emotional well-being. ?Home and relationship well-being. ?Sexual activity. ?Eating habits. ?History of falls. ?Memory and ability to understand (cognition). ?Work and work Statistician. ?Reproductive health. ?Screening  ?You may have the following tests or measurements: ?Height, weight, and BMI. ?Blood pressure. ?Lipid and cholesterol levels. These may be checked every 5 years, or more frequently if you are over 33 years old. ?Skin check. ?Lung cancer screening. You may have this screening every year starting at age 60 if you have a 30-pack-year history of smoking and currently smoke or have quit within the past 15 years. ?Fecal occult blood test (FOBT) of the stool. You may have this test every year starting at age 93. ?Flexible sigmoidoscopy or colonoscopy. You may have a sigmoidoscopy every 5 years or a colonoscopy every 10 years starting at age 5. ?Hepatitis C blood test. ?Hepatitis B blood test. ?Sexually transmitted disease (STD) testing. ?Diabetes screening. This is done by checking your blood sugar (glucose) after you have not eaten for a while (fasting). You may have this done every 1-3 years. ?Bone density scan. This is done to screen for osteoporosis. You may have this done starting at age 87. ?Mammogram. This may be done every 1-2 years. Talk to your health care provider about how often you should have regular mammograms. ?Talk with your health care provider about your test results, treatment options, and if necessary, the need for more tests. ?Vaccines  ?Your health care provider may recommend certain vaccines, such as: ?Influenza vaccine. This is recommended every year. ?Tetanus, diphtheria, and acellular pertussis (Tdap, Td) vaccine. You may need a Td booster every 10 years. ?Zoster vaccine. You may need this after age 16. ?Pneumococcal 13-valent conjugate (PCV13) vaccine. One  dose is recommended after age 38. ?Pneumococcal  polysaccharide (PPSV23) vaccine. One dose is recommended after age 68. ?Talk to your health care provider about which screenings and vaccines you need and how often you need them. ?This information is not intended to replace advice given to you by your health care provider. Make sure you discuss any questions you have with your health care provider. ?Document Released: 12/01/2015 Document Revised: 07/24/2016 Document Reviewed: 09/05/2015 ?Elsevier Interactive Patient Education ? 2017 [place]. ? ?Fall Prevention in the Home ?Falls can cause injuries. They can happen to people of all ages. There are many things you can do to make your home safe and to help prevent falls. ?What can I do on the outside of my home? ?Regularly fix the edges of walkways and driveways and fix any cracks. ?Remove anything that might make you trip as you walk through a door, such as a raised step or threshold. ?Trim any bushes or trees on the path to your home. ?Use bright outdoor lighting. ?Clear any walking paths of anything that might make someone trip, such as rocks or tools. ?Regularly check to see if handrails are loose or broken. Make sure that both sides of any steps have handrails. ?Any raised decks and porches should have guardrails on the edges. ?Have any leaves, snow, or ice cleared regularly. ?Use sand or salt on walking paths during winter. ?Clean up any spills in your garage right away. This includes oil or grease spills. ?What can I do in the bathroom? ?Use night lights. ?Install grab bars by the toilet and in the tub and shower. Do not use towel bars as grab bars. ?Use non-skid mats or decals in the tub or shower. ?If you need to sit down in the shower, use a plastic, non-slip stool. ?Keep the floor dry. Clean up any water that spills on the floor as soon as it happens. ?Remove soap buildup in the tub or shower regularly. ?Attach bath mats securely with double-sided non-slip rug tape. ?Do not have throw rugs and other  things on the floor that can make you trip. ?What can I do in the bedroom? ?Use night lights. ?Make sure that you have a light by your bed that is easy to reach. ?Do not use any sheets or blankets that are too big for your bed. They should not hang down onto the floor. ?Have a firm chair that has side arms. You can use this for support while you get dressed. ?Do not have throw rugs and other things on the floor that can make you trip. ?What can I do in the kitchen? ?Clean up any spills right away. ?Avoid walking on wet floors. ?Keep items that you use a lot in easy-to-reach places. ?If you need to reach something above you, use a strong step stool that has a grab bar. ?Keep electrical cords out of the way. ?Do not use floor polish or wax that makes floors slippery. If you must use wax, use non-skid floor wax. ?Do not have throw rugs and other things on the floor that can make you trip. ?What can I do with my stairs? ?Do not leave any items on the stairs. ?Make sure that there are handrails on both sides of the stairs and use them. Fix handrails that are broken or loose. Make sure that handrails are as long as the stairways. ?Check any carpeting to make sure that it is firmly attached to the stairs. Fix any carpet that is loose or worn. ?  Avoid having throw rugs at the top or bottom of the stairs. If you do have throw rugs, attach them to the floor with carpet tape. ?Make sure that you have a light switch at the top of the stairs and the bottom of the stairs. If you do not have them, ask someone to add them for you. ?What else can I do to help prevent falls? ?Wear shoes that: ?Do not have high heels. ?Have rubber bottoms. ?Are comfortable and fit you well. ?Are closed at the toe. Do not wear sandals. ?If you use a stepladder: ?Make sure that it is fully opened. Do not climb a closed stepladder. ?Make sure that both sides of the stepladder are locked into place. ?Ask someone to hold it for you, if possible. ?Clearly  mark and make sure that you can see: ?Any grab bars or handrails. ?First and last steps. ?Where the edge of each step is. ?Use tools that help you move around (mobility aids) if they are needed. These includ

## 2022-04-29 ENCOUNTER — Encounter: Payer: Self-pay | Admitting: Internal Medicine

## 2022-05-09 DIAGNOSIS — M542 Cervicalgia: Secondary | ICD-10-CM | POA: Diagnosis not present

## 2022-05-09 DIAGNOSIS — G518 Other disorders of facial nerve: Secondary | ICD-10-CM | POA: Diagnosis not present

## 2022-05-09 DIAGNOSIS — G43719 Chronic migraine without aura, intractable, without status migrainosus: Secondary | ICD-10-CM | POA: Diagnosis not present

## 2022-05-09 DIAGNOSIS — M791 Myalgia, unspecified site: Secondary | ICD-10-CM | POA: Diagnosis not present

## 2022-05-24 ENCOUNTER — Other Ambulatory Visit: Payer: Self-pay | Admitting: Internal Medicine

## 2022-05-24 ENCOUNTER — Other Ambulatory Visit: Payer: Self-pay

## 2022-05-24 ENCOUNTER — Telehealth: Payer: Self-pay | Admitting: Internal Medicine

## 2022-05-24 MED ORDER — LEVOTHYROXINE SODIUM 50 MCG PO TABS: 50.0000 ug | ORAL_TABLET | Freq: Every day | ORAL | 0 refills | Status: DC

## 2022-05-24 NOTE — Telephone Encounter (Signed)
Caller & Relationship to patient: Tracie Hart  Call back number: [phone]  Date of last office visit: 04/13/22  Date of next office visit: 06/17/22  Medication(s) to be refilled: levothyroxine (SYNTHROID) 50 MCG tablet       Preferred Pharmacy:  [facility] [place], [place] [address] Phone:  [phone]  Fax:  [phone]

## 2022-05-24 NOTE — Telephone Encounter (Signed)
Sent in today 

## 2022-06-03 ENCOUNTER — Other Ambulatory Visit: Payer: Self-pay | Admitting: Internal Medicine

## 2022-06-03 DIAGNOSIS — M25562 Pain in left knee: Secondary | ICD-10-CM | POA: Diagnosis not present

## 2022-06-03 DIAGNOSIS — M25512 Pain in left shoulder: Secondary | ICD-10-CM | POA: Diagnosis not present

## 2022-06-06 DIAGNOSIS — K5902 Outlet dysfunction constipation: Secondary | ICD-10-CM | POA: Diagnosis not present

## 2022-06-06 DIAGNOSIS — Z006 Encounter for examination for normal comparison and control in clinical research program: Secondary | ICD-10-CM | POA: Diagnosis not present

## 2022-06-06 DIAGNOSIS — N393 Stress incontinence (female) (male): Secondary | ICD-10-CM | POA: Diagnosis not present

## 2022-06-06 DIAGNOSIS — N952 Postmenopausal atrophic vaginitis: Secondary | ICD-10-CM | POA: Diagnosis not present

## 2022-06-14 ENCOUNTER — Telehealth: Payer: Self-pay

## 2022-06-14 DIAGNOSIS — R7303 Prediabetes: Secondary | ICD-10-CM

## 2022-06-14 DIAGNOSIS — E7849 Other hyperlipidemia: Secondary | ICD-10-CM

## 2022-06-14 DIAGNOSIS — E038 Other specified hypothyroidism: Secondary | ICD-10-CM

## 2022-06-14 DIAGNOSIS — M8588 Other specified disorders of bone density and structure, other site: Secondary | ICD-10-CM

## 2022-06-14 NOTE — Telephone Encounter (Signed)
Labs ordered.

## 2022-06-14 NOTE — Telephone Encounter (Signed)
Pt is asking that she has her labs done before coming in for a physical. It was scheduled for Monday 7/31 but she rescheduled it.  Please advised.

## 2022-06-17 ENCOUNTER — Encounter: Payer: Medicare HMO | Admitting: Internal Medicine

## 2022-06-17 ENCOUNTER — Other Ambulatory Visit: Payer: Medicare HMO

## 2022-06-18 ENCOUNTER — Other Ambulatory Visit (INDEPENDENT_AMBULATORY_CARE_PROVIDER_SITE_OTHER): Payer: Medicare HMO

## 2022-06-18 DIAGNOSIS — E7849 Other hyperlipidemia: Secondary | ICD-10-CM

## 2022-06-18 DIAGNOSIS — R7303 Prediabetes: Secondary | ICD-10-CM | POA: Diagnosis not present

## 2022-06-18 DIAGNOSIS — E038 Other specified hypothyroidism: Secondary | ICD-10-CM

## 2022-06-18 DIAGNOSIS — M8588 Other specified disorders of bone density and structure, other site: Secondary | ICD-10-CM

## 2022-06-18 LAB — LIPID PANEL
Cholesterol: 234 mg/dL — ABNORMAL HIGH (ref 0–200)
HDL: 51.4 mg/dL (ref >39.00)
LDL Cholesterol: 157 mg/dL — ABNORMAL HIGH (ref 0–99)
NonHDL: 182.56
Total CHOL/HDL Ratio: 5
Triglycerides: 126 mg/dL (ref 0.0–149.0)
VLDL: 25.2 mg/dL (ref 0.0–40.0)

## 2022-06-18 LAB — TSH: TSH: 2.12 u[IU]/mL (ref 0.35–5.50)

## 2022-06-18 LAB — CBC WITH DIFFERENTIAL/PLATELET
Basophils Absolute: 0 10*3/uL (ref 0.0–0.1)
Basophils Relative: 0.6 % (ref 0.0–3.0)
Eosinophils Absolute: 0 10*3/uL (ref 0.0–0.7)
Eosinophils Relative: 0.8 % (ref 0.0–5.0)
HCT: 41.8 % (ref 36.0–46.0)
Hemoglobin: 13.9 g/dL (ref 12.0–15.0)
Lymphocytes Relative: 23.9 % (ref 12.0–46.0)
Lymphs Abs: 1.1 10*3/uL (ref 0.7–4.0)
MCHC: 33.2 g/dL (ref 30.0–36.0)
MCV: 83.1 fl (ref 78.0–100.0)
Monocytes Absolute: 0.3 10*3/uL (ref 0.1–1.0)
Monocytes Relative: 6.9 % (ref 3.0–12.0)
Neutro Abs: 3.1 10*3/uL (ref 1.4–7.7)
Neutrophils Relative %: 67.8 % (ref 43.0–77.0)
Platelets: 140 10*3/uL — ABNORMAL LOW (ref 150.0–400.0)
RBC: 5.03 Mil/uL (ref 3.87–5.11)
RDW: 13.9 % (ref 11.5–15.5)
WBC: 4.5 10*3/uL (ref 4.0–10.5)

## 2022-06-18 LAB — COMPREHENSIVE METABOLIC PANEL
ALT: 15 U/L (ref 0–35)
AST: 21 U/L (ref 0–37)
Albumin: 4.4 g/dL (ref 3.5–5.2)
Alkaline Phosphatase: 50 U/L (ref 39–117)
BUN: 21 mg/dL (ref 6–23)
CO2: 30 mEq/L (ref 19–32)
Calcium: 9.3 mg/dL (ref 8.4–10.5)
Chloride: 103 mEq/L (ref 96–112)
Creatinine, Ser: 0.82 mg/dL (ref 0.40–1.20)
GFR: 74.75 mL/min (ref >60.00)
Glucose, Bld: 93 mg/dL (ref 70–99)
Potassium: 4 mEq/L (ref 3.5–5.1)
Sodium: 140 mEq/L (ref 135–145)
Total Bilirubin: 0.6 mg/dL (ref 0.2–1.2)
Total Protein: 7 g/dL (ref 6.0–8.3)

## 2022-06-18 LAB — VITAMIN D 25 HYDROXY (VIT D DEFICIENCY, FRACTURES): VITD: 34.6 ng/mL (ref 30.00–100.00)

## 2022-06-18 LAB — HEMOGLOBIN A1C: Hgb A1c MFr Bld: 5.9 % (ref 4.6–6.5)

## 2022-06-20 DIAGNOSIS — G43719 Chronic migraine without aura, intractable, without status migrainosus: Secondary | ICD-10-CM | POA: Diagnosis not present

## 2022-06-20 DIAGNOSIS — M791 Myalgia, unspecified site: Secondary | ICD-10-CM | POA: Diagnosis not present

## 2022-06-20 DIAGNOSIS — M542 Cervicalgia: Secondary | ICD-10-CM | POA: Diagnosis not present

## 2022-06-20 DIAGNOSIS — G518 Other disorders of facial nerve: Secondary | ICD-10-CM | POA: Diagnosis not present

## 2022-06-23 ENCOUNTER — Encounter: Payer: Self-pay | Admitting: Internal Medicine

## 2022-06-23 NOTE — Patient Instructions (Addendum)
Try replens  - vaginal moisturizer.     Medications changes include :   None - try to decrease your omeprazole   Your prescription(s) have been sent to your pharmacy.    A Ct scan of your heart arteries was ordered.   Return in about 6 months (around 12/26/2022) for follow up.   Health Maintenance, Female Adopting a healthy lifestyle and getting preventive care are important in promoting health and wellness. Ask your health care provider about: The right schedule for you to have regular tests and exams. Things you can do on your own to prevent diseases and keep yourself healthy. What should I know about diet, weight, and exercise? Eat a healthy diet  Eat a diet that includes plenty of vegetables, fruits, low-fat dairy products, and lean protein. Do not eat a lot of foods that are high in solid fats, added sugars, or sodium. Maintain a healthy weight Body mass index (BMI) is used to identify weight problems. It estimates body fat based on height and weight. Your health care provider can help determine your BMI and help you achieve or maintain a healthy weight. Get regular exercise Get regular exercise. This is one of the most important things you can do for your health. Most adults should: Exercise for at least 150 minutes each week. The exercise should increase your heart rate and make you sweat (moderate-intensity exercise). Do strengthening exercises at least twice a week. This is in addition to the moderate-intensity exercise. Spend less time sitting. Even light physical activity can be beneficial. Watch cholesterol and blood lipids Have your blood tested for lipids and cholesterol at 67 years of age, then have this test every 5 years. Have your cholesterol levels checked more often if: Your lipid or cholesterol levels are high. You are older than 66 years of age. You are at high risk for heart disease. What should I know about cancer screening? Depending on your health  history and family history, you may need to have cancer screening at various ages. This may include screening for: Breast cancer. Cervical cancer. Colorectal cancer. Skin cancer. Lung cancer. What should I know about heart disease, diabetes, and high blood pressure? Blood pressure and heart disease High blood pressure causes heart disease and increases the risk of stroke. This is more likely to develop in people who have high blood pressure readings or are overweight. Have your blood pressure checked: Every 3-5 years if you are 43-34 years of age. Every year if you are 64 years old or older. Diabetes Have regular diabetes screenings. This checks your fasting blood sugar level. Have the screening done: Once every three years after age 76 if you are at a normal weight and have a low risk for diabetes. More often and at a younger age if you are overweight or have a high risk for diabetes. What should I know about preventing infection? Hepatitis B If you have a higher risk for hepatitis B, you should be screened for this virus. Talk with your health care provider to find out if you are at risk for hepatitis B infection. Hepatitis C Testing is recommended for: Everyone born from [birth year] through 1965. Anyone with known risk factors for hepatitis C. Sexually transmitted infections (STIs) Get screened for STIs, including gonorrhea and chlamydia, if: You are sexually active and are younger than 66 years of age. You are older than 66 years of age and your health care provider tells you that you are at risk for  this type of infection. Your sexual activity has changed since you were last screened, and you are at increased risk for chlamydia or gonorrhea. Ask your health care provider if you are at risk. Ask your health care provider about whether you are at high risk for HIV. Your health care provider may recommend a prescription medicine to help prevent HIV infection. If you choose to take medicine to  prevent HIV, you should first get tested for HIV. You should then be tested every 3 months for as long as you are taking the medicine. Pregnancy If you are about to stop having your period (premenopausal) and you may become pregnant, seek counseling before you get pregnant. Take 400 to 800 micrograms (mcg) of folic acid every day if you become pregnant. Ask for birth control (contraception) if you want to prevent pregnancy. Osteoporosis and menopause Osteoporosis is a disease in which the bones lose minerals and strength with aging. This can result in bone fractures. If you are 38 years old or older, or if you are at risk for osteoporosis and fractures, ask your health care provider if you should: Be screened for bone loss. Take a calcium or vitamin D supplement to lower your risk of fractures. Be given hormone replacement therapy (HRT) to treat symptoms of menopause. Follow these instructions at home: Alcohol use Do not drink alcohol if: Your health care provider tells you not to drink. You are pregnant, may be pregnant, or are planning to become pregnant. If you drink alcohol: Limit how much you have to: 0-1 drink a day. Know how much alcohol is in your drink. In the [place] one drink equals one 12 oz bottle of beer (355 mL), one 5 oz glass of wine (148 mL), or one 1 oz glass of hard liquor (44 mL). Lifestyle Do not use any products that contain nicotine or tobacco. These products include cigarettes, chewing tobacco, and vaping devices, such as e-cigarettes. If you need help quitting, ask your health care provider. Do not use street drugs. Do not share needles. Ask your health care provider for help if you need support or information about quitting drugs. General instructions Schedule regular health, dental, and eye exams. Stay current with your vaccines. Tell your health care provider if: You often feel depressed. You have ever been abused or do not feel safe at  home. Summary Adopting a healthy lifestyle and getting preventive care are important in promoting health and wellness. Follow your health care provider's instructions about healthy diet, exercising, and getting tested or screened for diseases. Follow your health care provider's instructions on monitoring your cholesterol and blood pressure. This information is not intended to replace advice given to you by your health care provider. Make sure you discuss any questions you have with your health care provider. Document Revised: 03/26/2021 Document Reviewed: 03/26/2021 Elsevier Patient Education  [place].

## 2022-06-23 NOTE — Progress Notes (Signed)
Subjective:    Patient ID: Tracie Hart, female    DOB: [birth year], 66 y.o.   MRN: [id number]      HPI Tracie Hart is here for a Physical exam.    She had labs done last week.    Medications and allergies reviewed with patient and updated if appropriate.  Current Outpatient Medications on File Prior to Visit  Medication Sig Dispense Refill   calcium-vitamin D (OSCAL WITH D) 500-200 MG-UNIT tablet Take 1 tablet by mouth.     estradiol (ESTRACE) 0.1 MG/GM vaginal cream Insert 0.5 grams nightly for 2 weeks, then 0.5 grams twice weekly     ibuprofen (ADVIL) 600 MG tablet Take by mouth.     levothyroxine (SYNTHROID) 50 MCG tablet Take 1 tablet (50 mcg total) by mouth daily before breakfast. 90 tablet 0   omeprazole (PRILOSEC) 40 MG capsule Take 1 capsule (40 mg total) by mouth daily. 90 capsule 3   PARoxetine (PAXIL) 20 MG tablet TAKE 1 TABLET BY MOUTH EVERY DAY 90 tablet 1   SUMAtriptan (IMITREX) 100 MG tablet TAKE 1 TABLET BY MOUTH EVERY 2 HOURS AS NEEDED FOR MIGRAINE. MAY REPEAT IN 2 HOURS IF HEADACHE PERSISTS OR RECURS 10 tablet 8   vitamin B-12 (CYANOCOBALAMIN) 500 MCG tablet Take 1,000 mcg by mouth daily.      No current facility-administered medications on file prior to visit.    Review of Systems  Constitutional:  Negative for fever.  Eyes:  Negative for visual disturbance.  Respiratory:  Negative for cough, shortness of breath and wheezing.   Cardiovascular:  Negative for chest pain, palpitations and leg swelling.  Gastrointestinal:  Negative for abdominal pain, blood in stool, constipation, diarrhea and nausea.       Gerd  Genitourinary:  Positive for vaginal discharge (related to dryness). Negative for dysuria.  Musculoskeletal:  Positive for arthralgias (knee, hands). Negative for back pain.  Skin:  Negative for rash.  Neurological:  Positive for headaches. Negative for light-headedness.  Psychiatric/Behavioral:  Positive for dysphoric mood and sleep  disturbance. The patient is not nervous/anxious.        Objective:   Vitals:   06/25/22 0954  BP: 124/78  Pulse: 77  Temp: 97.9 F (36.6 C)  SpO2: 97%   Filed Weights   06/25/22 0954  Weight: 140 lb 9.6 oz (63.8 kg)   Body mass index is 24.13 kg/m.  BP Readings from Last 3 Encounters:  06/25/22 124/78  10/01/21 124/86  04/13/21 128/78    Wt Readings from Last 3 Encounters:  06/25/22 140 lb 9.6 oz (63.8 kg)  10/01/21 138 lb (62.6 kg)  04/13/21 143 lb (64.9 kg)       Physical Exam Constitutional: She appears well-developed and well-nourished. No distress.  HENT:  Head: Normocephalic and atraumatic.  Right Ear: External ear normal. Normal ear canal and TM Left Ear: External ear normal.  Normal ear canal and TM Mouth/Throat: Oropharynx is clear and moist.  Eyes: Conjunctivae normal.  Neck: Neck supple. No tracheal deviation present. No thyromegaly present.  No carotid bruit  Cardiovascular: Normal rate, regular rhythm and normal heart sounds.   No murmur heard.  No edema. Pulmonary/Chest: Effort normal and breath sounds normal. No respiratory distress. She has no wheezes. She has no rales.  Breast: deferred   Abdominal: Soft. She exhibits no distension. There is no tenderness.  Lymphadenopathy: She has no cervical adenopathy.  Skin: Skin is warm and dry. She is not diaphoretic.  Psychiatric: She  has a normal mood and affect. Her behavior is normal.     Lab Results  Component Value Date   WBC 4.5 06/18/2022   HGB 13.9 06/18/2022   HCT 41.8 06/18/2022   PLT 140.0 (L) 06/18/2022   GLUCOSE 93 06/18/2022   CHOL 234 (H) 06/18/2022   TRIG 126.0 06/18/2022   HDL 51.40 06/18/2022   LDLDIRECT 168.0 04/13/2021   LDLCALC 157 (H) 06/18/2022   ALT 15 06/18/2022   AST 21 06/18/2022   NA 140 06/18/2022   K 4.0 06/18/2022   CL 103 06/18/2022   CREATININE 0.82 06/18/2022   BUN 21 06/18/2022   CO2 30 06/18/2022   TSH 2.12 06/18/2022   HGBA1C 5.9 06/18/2022     The 10-year ASCVD risk score (Arnett DK, et al., 2019) is: 6.5%   Values used to calculate the score:     Age: 54 years     Sex: Female     Is Non-Hispanic African American: No     Diabetic: No     Tobacco smoker: No     Systolic Blood Pressure: 774 mmHg     Is BP treated: No     HDL Cholesterol: 51.4 mg/dL     Total Cholesterol: 234 mg/dL      Assessment & Plan:   Physical exam: Screening blood work  reviewed Exercise   regular - walking  --- will try to stop weight lifting Weight  - good Substance abuse  none   Reviewed recommended immunizations.      Health Maintenance  Topic Date Due   COVID-19 Vaccine (3 - Moderna risk series) 08/22/2020   Pneumonia Vaccine 22+ Years old (1 - PCV) Never done   Zoster Vaccines- Shingrix (1 of 2) 09/25/2022 (Originally 06/11/1975)   INFLUENZA VACCINE  02/16/2023 (Originally 06/18/2022)   DEXA SCAN  04/18/2023   MAMMOGRAM  11/29/2023   TETANUS/TDAP  02/11/2027   COLONOSCOPY (Pts 45-75yr Insurance coverage will need to be confirmed)  03/30/2028   Hepatitis C Screening  Completed   HPV VACCINES  Aged Out          See Problem List for Assessment and Plan of chronic medical problems.

## 2022-06-25 ENCOUNTER — Ambulatory Visit (INDEPENDENT_AMBULATORY_CARE_PROVIDER_SITE_OTHER): Payer: Medicare HMO | Admitting: Internal Medicine

## 2022-06-25 VITALS — BP 124/78 | HR 77 | Temp 97.9°F | Ht 64.0 in | Wt 140.6 lb

## 2022-06-25 DIAGNOSIS — M8588 Other specified disorders of bone density and structure, other site: Secondary | ICD-10-CM

## 2022-06-25 DIAGNOSIS — E038 Other specified hypothyroidism: Secondary | ICD-10-CM | POA: Diagnosis not present

## 2022-06-25 DIAGNOSIS — R7303 Prediabetes: Secondary | ICD-10-CM

## 2022-06-25 DIAGNOSIS — E7849 Other hyperlipidemia: Secondary | ICD-10-CM

## 2022-06-25 DIAGNOSIS — K219 Gastro-esophageal reflux disease without esophagitis: Secondary | ICD-10-CM

## 2022-06-25 DIAGNOSIS — F3289 Other specified depressive episodes: Secondary | ICD-10-CM | POA: Diagnosis not present

## 2022-06-25 DIAGNOSIS — N898 Other specified noninflammatory disorders of vagina: Secondary | ICD-10-CM

## 2022-06-25 DIAGNOSIS — Z136 Encounter for screening for cardiovascular disorders: Secondary | ICD-10-CM

## 2022-06-25 DIAGNOSIS — Z853 Personal history of malignant neoplasm of breast: Secondary | ICD-10-CM

## 2022-06-25 DIAGNOSIS — Z0000 Encounter for general adult medical examination without abnormal findings: Secondary | ICD-10-CM

## 2022-06-25 DIAGNOSIS — G43809 Other migraine, not intractable, without status migrainosus: Secondary | ICD-10-CM

## 2022-06-25 MED ORDER — PAROXETINE HCL 20 MG PO TABS: 20.0000 mg | ORAL_TABLET | Freq: Every day | ORAL | 1 refills | Status: DC

## 2022-06-25 MED ORDER — OMEPRAZOLE 40 MG PO CPDR: 40.0000 mg | DELAYED_RELEASE_CAPSULE | Freq: Every day | ORAL | 3 refills | Status: DC

## 2022-06-25 MED ORDER — LEVOTHYROXINE SODIUM 50 MCG PO TABS
50.0000 ug | ORAL_TABLET | Freq: Every day | ORAL | 3 refills | Status: DC
Start: 2022-06-25 — End: 2023-06-05

## 2022-06-25 NOTE — Assessment & Plan Note (Addendum)
Chronic Lab Results  Component Value Date   LDLCALC 157 (H) 06/18/2022   LDL considerably higher than last year Discussed medication versus continuing lifestyle to help control cholesterol She will continue to work on her lifestyle We will get CT coronary calcium score to evaluate risk further  The 10-year ASCVD risk score (Arnett DK, et al., 2019) is: 9%   Values used to calculate the score:     Age: 66 years     Sex: Female     Is Non-Hispanic African American: No     Diabetic: No     Tobacco smoker: No     Systolic Blood Pressure: 159 mmHg     Is BP treated: No     HDL Cholesterol: 51.4 mg/dL     Total Cholesterol: 234 mg/dL

## 2022-06-25 NOTE — Assessment & Plan Note (Signed)
Chronic Lab Results  Component Value Date   HGBA1C 5.9 06/18/2022   Diabetic diet, regular exercise

## 2022-06-25 NOTE — Assessment & Plan Note (Signed)
Related to menopause May be increasing risk of UTIs Does not want to use estrogen cream Advised can try other moisturizers-can try OTC replens

## 2022-06-25 NOTE — Assessment & Plan Note (Signed)
Chronic Controlled, Stable Continue paroxetine 20 mg daily 

## 2022-06-25 NOTE — Assessment & Plan Note (Addendum)
Chronic  Clinically euthyroid TSH in normal range Continue taking levothyroxine 50 mcg daily

## 2022-06-25 NOTE — Assessment & Plan Note (Signed)
Mammogram up-to-date No evidence of recurrence 

## 2022-06-25 NOTE — Assessment & Plan Note (Addendum)
Chronic GERD not ideally controlled Continue omeprazole 40 mg daily Advise diet-information given Try decreasing to 40 alternating with 20 mg once controlled Discussed trying to decrease the dose slowly if possible

## 2022-06-25 NOTE — Assessment & Plan Note (Signed)
Chronic DEXA up-to-date Continue calcium and vitamin D daily Vitamin D level is good Continue regular exercise

## 2022-06-25 NOTE — Assessment & Plan Note (Signed)
Chronic Following at the headache center Getting Botox injections Takes Imitrex as needed

## 2022-06-28 ENCOUNTER — Encounter: Payer: Self-pay | Admitting: Internal Medicine

## 2022-06-28 MED ORDER — OMEPRAZOLE 20 MG PO CPDR: 20.0000 mg | DELAYED_RELEASE_CAPSULE | Freq: Every day | ORAL | 3 refills | Status: DC

## 2022-07-24 ENCOUNTER — Ambulatory Visit (HOSPITAL_BASED_OUTPATIENT_CLINIC_OR_DEPARTMENT_OTHER)
Admission: RE | Admit: 2022-07-24 | Discharge: 2022-07-24 | Disposition: A | Discharge status: Home / Self Care | Payer: Medicare HMO | Source: Ambulatory Visit | Attending: Internal Medicine | Admitting: Internal Medicine

## 2022-07-24 ENCOUNTER — Encounter (HOSPITAL_BASED_OUTPATIENT_CLINIC_OR_DEPARTMENT_OTHER): Payer: Self-pay

## 2022-07-24 DIAGNOSIS — Z136 Encounter for screening for cardiovascular disorders: Secondary | ICD-10-CM | POA: Insufficient documentation

## 2022-07-24 DIAGNOSIS — E7849 Other hyperlipidemia: Secondary | ICD-10-CM | POA: Insufficient documentation

## 2022-07-25 ENCOUNTER — Encounter: Payer: Self-pay | Admitting: Internal Medicine

## 2022-07-25 DIAGNOSIS — E7849 Other hyperlipidemia: Secondary | ICD-10-CM

## 2022-07-25 DIAGNOSIS — R931 Abnormal findings on diagnostic imaging of heart and coronary circulation: Secondary | ICD-10-CM | POA: Insufficient documentation

## 2022-08-08 DIAGNOSIS — G43719 Chronic migraine without aura, intractable, without status migrainosus: Secondary | ICD-10-CM | POA: Diagnosis not present

## 2022-08-08 DIAGNOSIS — G518 Other disorders of facial nerve: Secondary | ICD-10-CM | POA: Diagnosis not present

## 2022-08-08 DIAGNOSIS — M542 Cervicalgia: Secondary | ICD-10-CM | POA: Diagnosis not present

## 2022-08-08 DIAGNOSIS — M791 Myalgia, unspecified site: Secondary | ICD-10-CM | POA: Diagnosis not present

## 2022-08-15 ENCOUNTER — Telehealth: Payer: Self-pay | Admitting: Licensed Clinical Social Worker

## 2022-08-15 ENCOUNTER — Ambulatory Visit: Payer: Self-pay | Admitting: Licensed Clinical Social Worker

## 2022-08-15 NOTE — Patient Instructions (Signed)
Visit Information  Thank you for taking time to talk with me today. Please don't hesitate to contact me if I can be of assistance to you.   Following are the goals we discussed today:   Goals Addressed             This Visit's Progress    COMPLETED: Care Coordination Activities No Follow up Required       Care Coordination Interventions: Reviewed Care Coordination Services:Declined Discussed benefits of Medicare Annual Wellness Visit: completed May 2023  Concerns with obtaining required medications: None Assessed Social Determinants of Health         Patient verbalizes understanding of instructions and care plan provided today and agrees to view in [place]. Active MyChart status and patient understanding of how to access instructions and care plan via MyChart confirmed with patient.     No further follow up required: by Care Coordination at this time   Care Coordination team works in collaboration with your primary care doctor.  Please call [phone] if you would like to schedule a phone appointment with a Nurse or Social work Care Coordinator to assist with navigating your physical and mental health needs.    [name], [place] [phone]

## 2022-08-15 NOTE — Patient Outreach (Signed)
  Care Coordination   08/15/2022 Name: Deasha Clendenin MRN: [id number] DOB: 12-03-1955   Care Coordination Outreach Attempts:  An unsuccessful telephone outreach was attempted today to offer the patient information about available care coordination services as a benefit of their health plan.   Follow Up Plan:  Additional outreach attempts will be made to offer the patient care coordination information and services.   Encounter Outcome:  No Answer  Care Coordination Interventions Activated:  No   Care Coordination Interventions:  No, not indicated    [name], [place] [phone]

## 2022-08-15 NOTE — Patient Outreach (Signed)
  Care Coordination  Initial Visit Note   08/15/2022 Name: Tracie Hart MRN: [id number] DOB: 1956/05/15  Tracie Hart is a 66 y.o. year old female who sees Burns, Claudina Lick, MD for primary care. I spoke with  Tracie Hart by phone today.  What matters to the patients health and wellness today?    Patient reports no concerns or needs from Care Coordination team with health and wellness related to physical or mental heath. .     Goals Addressed             This Visit's Progress    COMPLETED: Care Coordination Activities No Follow up Required       Care Coordination Interventions: Reviewed Care Coordination Services:Declined Discussed benefits of Medicare Annual Wellness Visit: completed May 2023  Concerns with obtaining required medications: None Assessed Social Determinants of Health         SDOH assessments and interventions completed:  Yes  SDOH Interventions Today    Flowsheet Row Most Recent Value  SDOH Interventions   Food Insecurity Interventions Intervention Not Indicated  Housing Interventions Intervention Not Indicated  Transportation Interventions Intervention Not Indicated      Care Coordination Interventions Activated:  Yes  Care Coordination Interventions:  Yes, provided   Follow up plan: No further intervention required.   Encounter Outcome:  Pt. Visit Completed   [name], [place] [phone]

## 2022-09-12 ENCOUNTER — Ambulatory Visit (INDEPENDENT_AMBULATORY_CARE_PROVIDER_SITE_OTHER): Payer: Medicare HMO | Admitting: Nurse Practitioner

## 2022-09-12 VITALS — BP 162/90 | HR 77 | Temp 98.3°F | Ht 64.0 in | Wt 140.0 lb

## 2022-09-12 DIAGNOSIS — R5381 Other malaise: Secondary | ICD-10-CM | POA: Insufficient documentation

## 2022-09-12 DIAGNOSIS — R030 Elevated blood-pressure reading, without diagnosis of hypertension: Secondary | ICD-10-CM | POA: Diagnosis not present

## 2022-09-12 DIAGNOSIS — R5383 Other fatigue: Secondary | ICD-10-CM | POA: Diagnosis not present

## 2022-09-12 LAB — CBC WITH DIFFERENTIAL/PLATELET
Basophils Absolute: 0 10*3/uL (ref 0.0–0.1)
Basophils Relative: 0.3 % (ref 0.0–3.0)
Eosinophils Absolute: 0.1 10*3/uL (ref 0.0–0.7)
Eosinophils Relative: 1.5 % (ref 0.0–5.0)
HCT: 40.5 % (ref 36.0–46.0)
Hemoglobin: 13.4 g/dL (ref 12.0–15.0)
Lymphocytes Relative: 27.8 % (ref 12.0–46.0)
Lymphs Abs: 1.5 10*3/uL (ref 0.7–4.0)
MCHC: 33 g/dL (ref 30.0–36.0)
MCV: 84.3 fl (ref 78.0–100.0)
Monocytes Absolute: 0.4 10*3/uL (ref 0.1–1.0)
Monocytes Relative: 7.9 % (ref 3.0–12.0)
Neutro Abs: 3.4 10*3/uL (ref 1.4–7.7)
Neutrophils Relative %: 62.5 % (ref 43.0–77.0)
Platelets: 135 10*3/uL — ABNORMAL LOW (ref 150.0–400.0)
RBC: 4.8 Mil/uL (ref 3.87–5.11)
RDW: 13.8 % (ref 11.5–15.5)
WBC: 5.5 10*3/uL (ref 4.0–10.5)

## 2022-09-12 LAB — COMPREHENSIVE METABOLIC PANEL
ALT: 15 U/L (ref 0–35)
AST: 17 U/L (ref 0–37)
Albumin: 4.3 g/dL (ref 3.5–5.2)
Alkaline Phosphatase: 60 U/L (ref 39–117)
BUN: 22 mg/dL (ref 6–23)
CO2: 33 mEq/L — ABNORMAL HIGH (ref 19–32)
Calcium: 10.2 mg/dL (ref 8.4–10.5)
Chloride: 101 mEq/L (ref 96–112)
Creatinine, Ser: 0.77 mg/dL (ref 0.40–1.20)
GFR: 80.48 mL/min (ref >60.00)
Glucose, Bld: 98 mg/dL (ref 70–99)
Potassium: 3.6 mEq/L (ref 3.5–5.1)
Sodium: 139 mEq/L (ref 135–145)
Total Bilirubin: 0.3 mg/dL (ref 0.2–1.2)
Total Protein: 6.7 g/dL (ref 6.0–8.3)

## 2022-09-12 LAB — T3, FREE: T3, Free: 2.8 pg/mL (ref 2.3–4.2)

## 2022-09-12 LAB — POCT INFLUENZA A/B
Influenza A, POC: NEGATIVE
Influenza B, POC: NEGATIVE

## 2022-09-12 LAB — POCT RAPID STREP A (OFFICE): Rapid Strep A Screen: NEGATIVE

## 2022-09-12 LAB — TSH: TSH: 4 u[IU]/mL (ref 0.35–5.50)

## 2022-09-12 LAB — T4, FREE: Free T4: 0.74 ng/dL (ref 0.60–1.60)

## 2022-09-12 LAB — POC COVID19 BINAXNOW: SARS Coronavirus 2 Ag: NEGATIVE

## 2022-09-12 NOTE — Assessment & Plan Note (Addendum)
Acute, onset 4 days ago.  Symptoms sound consistent with unidentified viral illness.  Negative for flu, COVID, strep today.  Recommend symptomatic management with hydration, rest, as needed ibuprofen or Tylenol.  Patient told to call office if symptoms worsen or do not improve within 14 days of symptom onset for further evaluation.  Will order blood work for further evaluation as well. Patient reports understanding.

## 2022-09-12 NOTE — Progress Notes (Signed)
Established Patient Office Visit  Subjective   Patient ID: Tracie Hart, female    DOB: 01-Nov-1956  Age: 66 y.o. MRN: [id number]  Chief Complaint  Patient presents with   Fatigue    Patient arrives for acute visit for the above.  Reports symptoms started about 4 days ago.  She is experiencing sore throat, earache, nausea, and body aches.  She does have plans to socialize with friends this weekend including elderly individuals and wanted to rule out COVID infection.  She denies shortness of breath, chest pain, coughing, fever, nasal congestion.  She has not taken any over-the-counter medication for symptoms.  She does have hypothyroidism and currently is on Synthroid 50 mcg daily.    Review of Systems  Constitutional:  Positive for malaise/fatigue.  HENT:  Positive for congestion and ear pain.   Respiratory:  Negative for shortness of breath.   Cardiovascular:  Negative for chest pain.  Gastrointestinal:  Positive for nausea.      Objective:     BP (!) 162/90   Pulse 77   Temp 98.3 F (36.8 C) (Oral)   Ht '5\' 4"'$  (1.626 m)   Wt 140 lb (63.5 kg)   SpO2 97%   BMI 24.03 kg/m  BP Readings from Last 3 Encounters:  09/12/22 (!) 162/90  06/25/22 124/78  10/01/21 124/86   Wt Readings from Last 3 Encounters:  09/12/22 140 lb (63.5 kg)  06/25/22 140 lb 9.6 oz (63.8 kg)  10/01/21 138 lb (62.6 kg)      Physical Exam Vitals reviewed.  Constitutional:      General: She is not in acute distress.    Appearance: Normal appearance.  HENT:     Head: Normocephalic and atraumatic.  Neck:     Vascular: No carotid bruit.  Cardiovascular:     Rate and Rhythm: Normal rate and regular rhythm.     Pulses: Normal pulses.     Heart sounds: Normal heart sounds.  Pulmonary:     Effort: Pulmonary effort is normal.     Breath sounds: Normal breath sounds.  Skin:    General: Skin is warm and dry.  Neurological:     General: No focal deficit present.     Mental Status: She is  alert and oriented to person, place, and time.  Psychiatric:        Mood and Affect: Mood normal.        Behavior: Behavior normal.        Judgment: Judgment normal.      Results for orders placed or performed in visit on 09/12/22  POC COVID-19  Result Value Ref Range   SARS Coronavirus 2 Ag Negative Negative  POCT Influenza A/B  Result Value Ref Range   Influenza A, POC Negative Negative   Influenza B, POC Negative Negative  POCT rapid strep A  Result Value Ref Range   Rapid Strep A Screen Negative Negative      The 10-year ASCVD risk score (Arnett DK, et al., 2019) is: 10.6%    Assessment & Plan:   Problem List Items Addressed This Visit       Other   Malaise - Primary    Acute, onset 4 days ago.  Symptoms sound consistent with unidentified viral illness.  Negative for flu, COVID, strep today.  Recommend symptomatic management with hydration, rest, as needed ibuprofen or Tylenol.  Patient told to call office if symptoms worsen or do not improve within 14 days of symptom onset  for further evaluation.  Will order blood work for further evaluation as well. Patient reports understanding.      Relevant Orders   TSH   T3, free   T4, free   CBC with Differential/Platelet   Comprehensive metabolic panel   Urine Culture   Elevated blood pressure reading    Blood pressure quite elevated today in office, patient is asymptomatic aside from fatigue.  Patient denies taking any medications with decongestants.  Did report eating an entire bag of premade frozen [employer] with large quantities of sodium last night.  Patient encouraged to cut out extra salt from her diet the next few days, and to monitor blood pressure at home.  She was educated on signs and symptoms of heart attack or stroke and to call 911 if these occur.  Patient told to call our office if blood pressure remains elevated next week.  Patient reports understanding.      Other Visit Diagnoses     Fatigue,  unspecified type       Relevant Orders   TSH   T3, free   T4, free   CBC with Differential/Platelet   Comprehensive metabolic panel   POC [id number] (Completed)   POCT Influenza A/B (Completed)   POCT rapid strep A (Completed)   Urine Culture       Return if symptoms worsen or fail to improve.    Ailene Ards, NP

## 2022-09-12 NOTE — Assessment & Plan Note (Signed)
Blood pressure quite elevated today in office, patient is asymptomatic aside from fatigue.  Patient denies taking any medications with decongestants.  Did report eating an entire bag of premade frozen [employer] with large quantities of sodium last night.  Patient encouraged to cut out extra salt from her diet the next few days, and to monitor blood pressure at home.  She was educated on signs and symptoms of heart attack or stroke and to call 911 if these occur.  Patient told to call our office if blood pressure remains elevated next week.  Patient reports understanding.

## 2022-09-14 LAB — URINE CULTURE: Result:: NO GROWTH

## 2022-10-31 ENCOUNTER — Other Ambulatory Visit: Payer: Self-pay | Admitting: Internal Medicine

## 2022-10-31 DIAGNOSIS — M25562 Pain in left knee: Secondary | ICD-10-CM | POA: Diagnosis not present

## 2022-10-31 DIAGNOSIS — Z1231 Encounter for screening mammogram for malignant neoplasm of breast: Secondary | ICD-10-CM

## 2022-11-01 ENCOUNTER — Encounter (INDEPENDENT_AMBULATORY_CARE_PROVIDER_SITE_OTHER): Payer: Medicare HMO | Admitting: Internal Medicine

## 2022-11-01 DIAGNOSIS — K644 Residual hemorrhoidal skin tags: Secondary | ICD-10-CM | POA: Diagnosis not present

## 2022-11-02 MED ORDER — HYDROCORTISONE 2.5 % EX CREA: TOPICAL_CREAM | Freq: Two times a day (BID) | CUTANEOUS | 0 refills | Status: DC

## 2022-11-02 NOTE — Telephone Encounter (Signed)

## 2022-11-30 ENCOUNTER — Other Ambulatory Visit: Payer: Self-pay | Admitting: Internal Medicine

## 2022-12-02 ENCOUNTER — Other Ambulatory Visit: Payer: Self-pay

## 2022-12-26 ENCOUNTER — Ambulatory Visit
Admission: RE | Admit: 2022-12-26 | Discharge: 2022-12-26 | Disposition: A | Discharge status: Home / Self Care | Payer: Medicare HMO | Source: Ambulatory Visit | Attending: Internal Medicine | Admitting: Internal Medicine

## 2022-12-26 DIAGNOSIS — Z1231 Encounter for screening mammogram for malignant neoplasm of breast: Secondary | ICD-10-CM | POA: Diagnosis not present

## 2022-12-30 DIAGNOSIS — Z0100 Encounter for examination of eyes and vision without abnormal findings: Secondary | ICD-10-CM | POA: Diagnosis not present

## 2023-01-02 DIAGNOSIS — G43719 Chronic migraine without aura, intractable, without status migrainosus: Secondary | ICD-10-CM | POA: Diagnosis not present

## 2023-01-02 DIAGNOSIS — M542 Cervicalgia: Secondary | ICD-10-CM | POA: Diagnosis not present

## 2023-01-02 DIAGNOSIS — M791 Myalgia, unspecified site: Secondary | ICD-10-CM | POA: Diagnosis not present

## 2023-01-02 DIAGNOSIS — G518 Other disorders of facial nerve: Secondary | ICD-10-CM | POA: Diagnosis not present

## 2023-01-03 ENCOUNTER — Institutional Professional Consult (permissible substitution): Payer: Medicare HMO | Admitting: Pulmonary Disease

## 2023-01-20 ENCOUNTER — Institutional Professional Consult (permissible substitution): Payer: Medicare HMO | Admitting: Pulmonary Disease

## 2023-03-17 ENCOUNTER — Encounter: Payer: Self-pay | Admitting: Pulmonary Disease

## 2023-03-17 ENCOUNTER — Ambulatory Visit: Payer: Medicare HMO | Admitting: Pulmonary Disease

## 2023-03-17 VITALS — BP 132/84 | HR 77 | Ht 64.0 in | Wt 140.8 lb

## 2023-03-17 DIAGNOSIS — G4733 Obstructive sleep apnea (adult) (pediatric): Secondary | ICD-10-CM

## 2023-03-17 NOTE — Addendum Note (Signed)
Addended by: Lanna Poche on: 03/17/2023 01:53 PM   Modules accepted: Orders

## 2023-03-17 NOTE — Progress Notes (Signed)
Tracie Hart    [id number]    04/12/1956  Primary Care Physician:Burns, Bobette Mo, MD  Referring Physician: Pincus Sanes, MD [address] [place],  [place] [address]  Chief complaint:    Patient with a history of obstructive sleep apnea in for follow-up HPI:  She was last seen in the office in 2019  She is running out of supplies at present  History of mild obstructive sleep apnea with AHI of 9.6 in 2013 Was titrated to CPAP of 8 which she has been using  Was not able to tolerate CPAP previously but after she was seen in 2019, got back to using CPAP She still does not like it a whole lot but has been compliant  Uses CPAP for about 6 hours a night She wonders whether she still has significant sleep apnea, has lost some weight since last time she was here  Usually goes to bed about 9 PM Takes about 15 minutes sometimes several hours before she falls asleep 3-4 awakenings Final wake up time about 6 AM  She does have some dryness of her mouth when she does not use CPAP She does have migraines Both parents had OSA  History of gallbladder surgery, breast surgery, hysterectomy in the past Never smoker  She had had UPPP in the past, tried an oral device in the past Outpatient Encounter Medications as of 03/17/2023  Medication Sig   BOTOX 100 units SOLR injection    calcium citrate (CALCITRATE - DOSED IN MG ELEMENTAL CALCIUM) 950 (200 Ca) MG tablet Take by mouth.   calcium-vitamin D (OSCAL WITH D) 500-200 MG-UNIT tablet Take 1 tablet by mouth.   Ergocalciferol 10 MCG (400 UNIT) TABS Vitamin D   hydrocortisone 2.5 % cream Apply topically 2 (two) times daily. Use up to 14 days   ibuprofen (ADVIL) 600 MG tablet Take by mouth.   levothyroxine (SYNTHROID) 50 MCG tablet Take 1 tablet (50 mcg total) by mouth daily before breakfast.   omeprazole (PRILOSEC) 20 MG capsule TAKE 1 CAPSULE(20 MG) BY MOUTH DAILY   PARoxetine (PAXIL) 20 MG tablet Take 1 tablet (20 mg  total) by mouth daily.   SUMAtriptan (IMITREX) 100 MG tablet TAKE 1 TABLET BY MOUTH EVERY 2 HOURS AS NEEDED FOR MIGRAINE. MAY REPEAT IN 2 HOURS IF HEADACHE PERSISTS OR RECURS   vitamin B-12 (CYANOCOBALAMIN) 500 MCG tablet Take 1,000 mcg by mouth daily.    No facility-administered encounter medications on file as of 03/17/2023.    Allergies as of 03/17/2023 - Review Complete 03/17/2023  Allergen Reaction Noted   Codeine Itching 03/17/2018    Past Medical History:  Diagnosis Date   Allergy    Anemia    Anxiety    Cancer (HCC)    breast cancer   Depression    GERD (gastroesophageal reflux disease)    Hypertension    Migraine    Migraine    Osteopenia    Sleep apnea    Sleep apnea with use of continuous positive airway pressure (CPAP)    Thyroid disease     Past Surgical History:  Procedure Laterality Date   BREAST LUMPECTOMY Right 10/22/2018   BREAST LUMPECTOMY WITH RADIOACTIVE SEED LOCALIZATION Right 10/22/2018   Procedure: BREAST LUMPECTOMY WITH RADIOACTIVE SEED LOCALIZATION;  Surgeon: Glenna Fellows, MD;  Location: [facility];  Service: General;  Laterality: Right;   COLONOSCOPY     GALLBLADDER SURGERY     KNEE ARTHROSCOPY  Left    NEUROFIBROMA EXCISION  1986   PALATE / UVULA BIOPSY / EXCISION      Family History  Problem Relation Age of Onset   Arthritis Mother    Diabetes Mother    Heart disease Mother    Hypertension Mother    Stroke Mother    Arthritis Father    Depression Father    Diabetes Father    Hyperlipidemia Father    Hypertension Father    Depression Brother    Hypertension Brother    Heart disease Maternal Grandmother    COPD Paternal Grandmother    COPD Paternal Grandfather    Colon cancer Paternal Grandfather    Colon polyps Neg Hx    Esophageal cancer Neg Hx    Stomach cancer Neg Hx    Rectal cancer Neg Hx     Social History   Socioeconomic History   Marital status: Divorced    Spouse name: Not on file   Number of  children: 1   Years of education: Not on file   Highest education level: Not on file  Occupational History   Occupation: retired  Tobacco Use   Smoking status: Never   Smokeless tobacco: Never  Vaping Use   Vaping Use: Never used  Substance and Sexual Activity   Alcohol use: Yes    Comment: rarely   Drug use: No   Sexual activity: Never  Other Topics Concern   Not on file  Social History Narrative   No regular exercise   Social Determinants of Health   Financial Resource Strain: Low Risk  (03/28/2022)   Overall Financial Resource Strain (CARDIA)    Difficulty of Paying Living Expenses: Not hard at all  Food Insecurity: No Food Insecurity (03/28/2022)   Hunger Vital Sign    Worried About Running Out of Food in the Last Year: Never true    Ran Out of Food in the Last Year: Never true  Transportation Needs: No Transportation Needs (03/28/2022)   PRAPARE - Administrator, Civil Service (Medical): No    Lack of Transportation (Non-Medical): No  Physical Activity: Sufficiently Active (03/28/2022)   Exercise Vital Sign    Days of Exercise per Week: 7 days    Minutes of Exercise per Session: 30 min  Stress: No Stress Concern Present (03/28/2022)   [employer] of Occupational Health - Occupational Stress Questionnaire    Feeling of Stress : Not at all  Social Connections: Moderately Integrated (03/28/2022)   Social Connection and Isolation Panel [NHANES]    Frequency of Communication with Friends and Family: More than three times a week    Frequency of Social Gatherings with Friends and Family: More than three times a week    Attends Religious Services: 1 to 4 times per year    Active Member of [employer] or Organizations: Yes    Attends Banker Meetings: 1 to 4 times per year    Marital Status: Divorced  Intimate Partner Violence: Not At Risk (03/28/2022)   Humiliation, Afraid, Rape, and Kick questionnaire    Fear of Current or Ex-Partner: No     Emotionally Abused: No    Physically Abused: No    Sexually Abused: No    Review of Systems  Respiratory:  Positive for apnea. Negative for shortness of breath.   Psychiatric/Behavioral:  Positive for sleep disturbance.     Vitals:   03/17/23 1050  BP: 132/84  Pulse: 77  SpO2: 98%  Physical Exam Constitutional:      Appearance: She is obese.  HENT:     Head: Normocephalic.     Mouth/Throat:     Mouth: Mucous membranes are moist.  Eyes:     General: Scleral icterus present.     Pupils: Pupils are equal, round, and reactive to light.  Cardiovascular:     Rate and Rhythm: Normal rate and regular rhythm.     Heart sounds: No murmur heard.    No friction rub.  Pulmonary:     Effort: No respiratory distress.     Breath sounds: No stridor. No wheezing or rhonchi.  Musculoskeletal:     Cervical back: No rigidity or tenderness.  Neurological:     Mental Status: She is alert.  Psychiatric:        Mood and Affect: Mood normal.       03/17/2023   10:00 AM 07/01/2018    9:00 AM  Results of the Epworth flowsheet  Sitting and reading 0 1  Watching TV 0 1  Sitting, inactive in a public place (e.g. a theatre or a meeting) 0 0  As a passenger in a car for an hour without a break 0 0  Lying down to rest in the afternoon when circumstances permit 2 2  Sitting and talking to someone 0 0  Sitting quietly after a lunch without alcohol 0 0  In a car, while stopped for a few minutes in traffic 0 0  Total score 2 4    Data Reviewed: Compliance data reviewed showing 91% compliance CPAP of 8 Residual AHI of 1.1  Assessment:  Mild obstructive sleep apnea -Appears adequately treated with CPAP therapy  Patient has had significant weight loss since her last visit -She wonders whether she still has significant obstructive sleep apnea  Current treatment and treatment options discussed  Plan/Recommendations: Schedule patient for home sleep study  Encouraged to continue using  CPAP at present  Encouraged to get 6 to 8 hours of sleep every night  Tentative follow-up in about 6 months  Encouraged to call with significant concerns   Virl Diamond MD [facility] 03/17/2023, 10:55 AM  CC: Pincus Sanes, MD

## 2023-03-17 NOTE — Patient Instructions (Signed)
DME referral-[facility] -CPAP supplies  Schedule for home sleep study  Tentative follow-up in 6 months  Continue using your current machine  Call with significant concerns

## 2023-03-18 ENCOUNTER — Telehealth: Payer: Self-pay

## 2023-03-18 NOTE — Telephone Encounter (Signed)
Called patient to schedule Medicare Annual Wellness Visit (AWV). Unable to reach patient.  Last date of AWV: 03/27/22  Please schedule an appointment at any time On Annual Wellness Visit Schedule.

## 2023-04-02 DIAGNOSIS — M791 Myalgia, unspecified site: Secondary | ICD-10-CM | POA: Diagnosis not present

## 2023-04-02 DIAGNOSIS — M542 Cervicalgia: Secondary | ICD-10-CM | POA: Diagnosis not present

## 2023-04-02 DIAGNOSIS — G43719 Chronic migraine without aura, intractable, without status migrainosus: Secondary | ICD-10-CM | POA: Diagnosis not present

## 2023-04-02 DIAGNOSIS — G518 Other disorders of facial nerve: Secondary | ICD-10-CM | POA: Diagnosis not present

## 2023-04-22 DIAGNOSIS — H5203 Hypermetropia, bilateral: Secondary | ICD-10-CM | POA: Diagnosis not present

## 2023-05-26 ENCOUNTER — Other Ambulatory Visit: Payer: Self-pay | Admitting: Internal Medicine

## 2023-06-05 ENCOUNTER — Other Ambulatory Visit: Payer: Self-pay | Admitting: Internal Medicine

## 2023-06-09 ENCOUNTER — Telehealth: Payer: Self-pay | Admitting: Internal Medicine

## 2023-06-09 DIAGNOSIS — E038 Other specified hypothyroidism: Secondary | ICD-10-CM

## 2023-06-09 DIAGNOSIS — E7849 Other hyperlipidemia: Secondary | ICD-10-CM

## 2023-06-09 DIAGNOSIS — M8588 Other specified disorders of bone density and structure, other site: Secondary | ICD-10-CM

## 2023-06-09 DIAGNOSIS — R030 Elevated blood-pressure reading, without diagnosis of hypertension: Secondary | ICD-10-CM

## 2023-06-09 DIAGNOSIS — R7303 Prediabetes: Secondary | ICD-10-CM

## 2023-06-09 DIAGNOSIS — R931 Abnormal findings on diagnostic imaging of heart and coronary circulation: Secondary | ICD-10-CM

## 2023-06-09 NOTE — Telephone Encounter (Signed)
Pt has [employer],.Tracie KitchenShearon Stalls

## 2023-06-09 NOTE — Telephone Encounter (Signed)
Patient scheduled her physical for 08/05/2023. She would like for orders to be put in for lab work prior to the appointment. She would like a call back at [phone] to schedule those.

## 2023-06-12 ENCOUNTER — Encounter: Payer: Self-pay | Admitting: Internal Medicine

## 2023-06-13 NOTE — Telephone Encounter (Signed)
No orders in epic. Pt has [employer].Marland KitchenRaechel Hart

## 2023-06-15 NOTE — Telephone Encounter (Signed)
Labs ordered - schedule one week prior to CPE

## 2023-06-16 ENCOUNTER — Ambulatory Visit: Payer: Medicare HMO

## 2023-06-16 VITALS — Ht 64.0 in | Wt 140.0 lb

## 2023-06-16 DIAGNOSIS — Z780 Asymptomatic menopausal state: Secondary | ICD-10-CM | POA: Diagnosis not present

## 2023-06-16 DIAGNOSIS — Z0000 Encounter for general adult medical examination without abnormal findings: Secondary | ICD-10-CM | POA: Diagnosis not present

## 2023-06-16 NOTE — Progress Notes (Signed)
Subjective:   Tracie Hart is a 67 y.o. female who presents for Medicare Annual (Subsequent) preventive examination.  Visit Complete: Virtual  I connected with  Tracie Hart on 06/16/23 by a audio enabled telemedicine application and verified that I am speaking with the correct person using two identifiers.  Patient Location: Home  Provider Location: Office/Clinic  I discussed the limitations of evaluation and management by telemedicine. The patient expressed understanding and agreed to proceed.  Vital Signs: Per patient no change in vitals since last visit.   Review of Systems    Cardiac Risk Factors include: advanced age (>61men, >25 women);dyslipidemia;Other (see comment), Risk factor comments: Osteopenia, OSA     Objective:    Today's Vitals   06/16/23 1032  Weight: 140 lb (63.5 kg)  Height: 5\' 4"  (1.626 m)   Body mass index is 24.03 kg/m.     06/16/2023   10:44 AM 03/28/2022   11:35 AM 11/19/2018    2:01 PM 10/22/2018    6:34 AM 10/12/2018   11:37 AM 10/08/2018   12:27 PM  Advanced Directives  Does Patient Have a Medical Advance Directive? Yes No No No No No  Type of Estate agent of [place];Living will       Copy of Healthcare Power of Attorney in Chart? No - copy requested       Would patient like information on creating a medical advance directive?  No - Patient declined  No - Patient declined No - Patient declined     Current Medications (verified) Outpatient Encounter Medications as of 06/16/2023  Medication Sig   BOTOX 100 units SOLR injection    calcium citrate (CALCITRATE - DOSED IN MG ELEMENTAL CALCIUM) 950 (200 Ca) MG tablet Take by mouth.   ibuprofen (ADVIL) 600 MG tablet Take by mouth.   levothyroxine (SYNTHROID) 50 MCG tablet TAKE 1 TABLET(50 MCG) BY MOUTH DAILY BEFORE BREAKFAST   omeprazole (PRILOSEC) 20 MG capsule TAKE 1 CAPSULE(20 MG) BY MOUTH DAILY   PARoxetine (PAXIL) 20 MG tablet Take 1 tablet (20 mg  total) by mouth daily. Annual appt due in Aug must see provider for future refills   SUMAtriptan (IMITREX) 100 MG tablet TAKE 1 TABLET BY MOUTH EVERY 2 HOURS AS NEEDED FOR MIGRAINE. MAY REPEAT IN 2 HOURS IF HEADACHE PERSISTS OR RECURS   vitamin B-12 (CYANOCOBALAMIN) 500 MCG tablet Take 1,000 mcg by mouth daily.    calcium-vitamin D (OSCAL WITH D) 500-200 MG-UNIT tablet Take 1 tablet by mouth. (Patient not taking: Reported on 06/16/2023)   Ergocalciferol 10 MCG (400 UNIT) TABS Vitamin D (Patient not taking: Reported on 06/16/2023)   hydrocortisone 2.5 % cream Apply topically 2 (two) times daily. Use up to 14 days (Patient not taking: Reported on 06/16/2023)   No facility-administered encounter medications on file as of 06/16/2023.    Allergies (verified) Codeine   History: Past Medical History:  Diagnosis Date   Allergy    Anemia    Anxiety    Cancer (HCC)    breast cancer   Depression    GERD (gastroesophageal reflux disease)    Hypertension    Migraine    Migraine    Osteopenia    Sleep apnea    Sleep apnea with use of continuous positive airway pressure (CPAP)    Thyroid disease    Past Surgical History:  Procedure Laterality Date   BREAST LUMPECTOMY Right 10/22/2018   BREAST LUMPECTOMY WITH RADIOACTIVE SEED LOCALIZATION Right 10/22/2018  Procedure: BREAST LUMPECTOMY WITH RADIOACTIVE SEED LOCALIZATION;  Surgeon: Glenna Fellows, MD;  Location: [facility];  Service: General;  Laterality: Right;   COLONOSCOPY     GALLBLADDER SURGERY     KNEE ARTHROSCOPY Left    NEUROFIBROMA EXCISION  1986   PALATE / UVULA BIOPSY / EXCISION     Family History  Problem Relation Age of Onset   Arthritis Mother    Diabetes Mother    Heart disease Mother    Hypertension Mother    Stroke Mother    Arthritis Father    Depression Father    Diabetes Father    Hyperlipidemia Father    Hypertension Father    Depression Brother    Hypertension Brother    Heart disease  Maternal Grandmother    COPD Paternal Grandmother    COPD Paternal Grandfather    Colon cancer Paternal Grandfather    Colon polyps Neg Hx    Esophageal cancer Neg Hx    Stomach cancer Neg Hx    Rectal cancer Neg Hx    Social History   Socioeconomic History   Marital status: Divorced    Spouse name: Not on file   Number of children: 1   Years of education: Not on file   Highest education level: Not on file  Occupational History   Occupation: retired  Tobacco Use   Smoking status: Never   Smokeless tobacco: Never  Vaping Use   Vaping status: Never Used  Substance and Sexual Activity   Alcohol use: Yes    Comment: rarely   Drug use: No   Sexual activity: Never  Other Topics Concern   Not on file  Social History Narrative   No regular exercise   Social Determinants of Health   Financial Resource Strain: Low Risk  (06/16/2023)   Overall Financial Resource Strain (CARDIA)    Difficulty of Paying Living Expenses: Not very hard  Food Insecurity: No Food Insecurity (06/16/2023)   Hunger Vital Sign    Worried About Running Out of Food in the Last Year: Never true    Ran Out of Food in the Last Year: Never true  Transportation Needs: No Transportation Needs (06/16/2023)   PRAPARE - Administrator, Civil Service (Medical): No    Lack of Transportation (Non-Medical): No  Physical Activity: Sufficiently Active (06/16/2023)   Exercise Vital Sign    Days of Exercise per Week: 7 days    Minutes of Exercise per Session: 90 min  Stress: No Stress Concern Present (06/16/2023)   [employer] of Occupational Health - Occupational Stress Questionnaire    Feeling of Stress : Not at all  Social Connections: Moderately Integrated (06/16/2023)   Social Connection and Isolation Panel [NHANES]    Frequency of Communication with Friends and Family: More than three times a week    Frequency of Social Gatherings with Friends and Family: Three times a week    Attends Religious  Services: 1 to 4 times per year    Active Member of Clubs or Organizations: Yes    Attends Engineer, structural: More than 4 times per year    Marital Status: Divorced    Tobacco Counseling Counseling given: Not Answered   Clinical Intake:  Pre-visit preparation completed: Yes  Pain : No/denies pain     BMI - recorded: 24.03 Nutritional Status: BMI of 19-24  Normal Nutritional Risks: None Diabetes: No  How often do you need to have someone help you  when you read instructions, pamphlets, or other written materials from your doctor or pharmacy?: 1 - Never     Information entered by :: Tracie Hart, RMA   Activities of Daily Living    06/16/2023   10:34 AM  In your present state of health, do you have any difficulty performing the following activities:  Hearing? 1  Comment per patient-in process of having a hearing test done  Vision? 0  Difficulty concentrating or making decisions? 1  Comment notice some chang in memory  Walking or climbing stairs? 0  Dressing or bathing? 0  Doing errands, shopping? 0  Preparing Food and eating ? N  Using the Toilet? N  In the past six months, have you accidently leaked urine? N  Do you have problems with loss of bowel control? N  Managing your Medications? N  Managing your Finances? N  Housekeeping or managing your Housekeeping? N    Patient Care Team: Pincus Sanes, MD as PCP - General (Internal Medicine) Antony Blackbird, MD as Consulting Physician (Radiation Oncology) Serena Croissant, MD as Consulting Physician (Hematology and Oncology) Glenna Fellows, MD (Inactive) as Consulting Physician (General Surgery) [place], Heather, OD as Consulting Physician (Optometry)  Indicate any recent Medical Services you may have received from other than [facility] providers in the past year (date may be approximate).     Assessment:   This is a routine wellness examination for Tracie Hart.  Hearing/Vision screen Hearing Screening -  Comments:: Having her hearing checked soon.-per patient has been some change  Vision Screening - Comments:: Wears contacts  Dietary issues and exercise activities discussed:     Goals Addressed               This Visit's Progress     To maintain my health and tone up more. (pt-stated)   On track     Wants to cut back on sugar intake.      Depression Screen    06/16/2023   10:52 AM 06/25/2022   10:09 AM 03/28/2022   11:40 AM 10/08/2018   12:37 PM 11/03/2017    3:16 PM  PHQ 2/9 Scores  PHQ - 2 Score 0 0 1 1 1   PHQ- 9 Score 5        Fall Risk    06/16/2023   10:46 AM 06/25/2022   10:07 AM 03/28/2022   11:38 AM 11/27/2018   11:16 AM 10/08/2018   12:36 PM  Fall Risk   Falls in the past year? 1 0 0 0 0  Number falls in past yr: 1 0 0 0 0  Injury with Fall? 0 0 0  0  Risk for fall due to : Impaired balance/gait No Fall Risks No Fall Risks    Follow up Falls evaluation completed;Falls prevention discussed Falls evaluation completed Falls evaluation completed      MEDICARE RISK AT HOME:  Medicare Risk at Home - 06/16/23 1047     Any stairs in or around the home? Yes   outside   If so, are there any without handrails? No    Home free of loose throw rugs in walkways, pet beds, electrical cords, etc? No    Adequate lighting in your home to reduce risk of falls? Yes    Life alert? No    Use of a cane, walker or w/c? No    Grab bars in the bathroom? No    Shower chair or bench in shower? No    Elevated toilet seat  or a handicapped toilet? No             TIMED UP AND GO:  Was the test performed?  No    Cognitive Function:    03/28/2022   11:56 AM  MMSE - Mini Mental State Exam  Not completed: Unable to complete        06/16/2023   10:48 AM 03/28/2022   11:56 AM  6CIT Screen  What Year? 0 points 0 points  What month? 0 points 0 points  What time? 0 points 0 points  Count back from 20 0 points 0 points  Months in reverse 0 points 0 points  Repeat phrase 0  points 0 points  Total Score 0 points 0 points    Immunizations Immunization History  Administered Date(s) Administered   Influenza,inj,Quad PF,6+ Mos 11/05/2018   Influenza-Unspecified 09/17/2017   Moderna Sars-Covid-2 Vaccination 06/19/2020, 07/25/2020   Td 04/14/2012, 02/10/2017   Tdap 04/14/2012, 04/14/2012   Zoster, Live 08/19/2016    TDAP status: Up to date  Flu Vaccine status: Declined, Education has been provided regarding the importance of this vaccine but patient still declined. Advised may receive this vaccine at local pharmacy or Health Dept. Aware to provide a copy of the vaccination record if obtained from local pharmacy or Health Dept. Verbalized acceptance and understanding.  Pneumococcal vaccine status: Declined,  Education has been provided regarding the importance of this vaccine but patient still declined. Advised may receive this vaccine at local pharmacy or Health Dept. Aware to provide a copy of the vaccination record if obtained from local pharmacy or Health Dept. Verbalized acceptance and understanding.   Covid-19 vaccine status: Completed vaccines  Qualifies for Shingles Vaccine? Yes   Zostavax completed Yes   Shingrix Completed?: No.    Education has been provided regarding the importance of this vaccine. Patient has been advised to call insurance company to determine out of pocket expense if they have not yet received this vaccine. Advised may also receive vaccine at local pharmacy or Health Dept. Verbalized acceptance and understanding.  Screening Tests Health Maintenance  Topic Date Due   Zoster Vaccines- Shingrix (1 of 2) 06/11/1975   COVID-19 Vaccine (3 - Moderna risk series) 08/22/2020   Pneumonia Vaccine 59+ Years old (1 of 1 - PCV) Never done   DEXA SCAN  04/18/2023   INFLUENZA VACCINE  06/19/2023   Medicare Annual Wellness (AWV)  06/15/2024   MAMMOGRAM  12/26/2024   DTaP/Tdap/Td (5 - Td or Tdap) 02/11/2027   Colonoscopy  03/30/2028    Hepatitis C Screening  Completed   HPV VACCINES  Aged Out    Health Maintenance  Health Maintenance Due  Topic Date Due   Zoster Vaccines- Shingrix (1 of 2) 06/11/1975   COVID-19 Vaccine (3 - Moderna risk series) 08/22/2020   Pneumonia Vaccine 64+ Years old (1 of 1 - PCV) Never done   DEXA SCAN  04/18/2023    Colorectal cancer screening: Type of screening: Colonoscopy. Completed 03/30/2021. Repeat every 7 years  Mammogram status: Completed 12/26/2022. Repeat every year  Bone Density status: Ordered 06/16/2023. Pt provided with contact info and advised to call to schedule appt.  Lung Cancer Screening: (Low Dose CT Chest recommended if Age 20-80 years, 20 pack-year currently smoking OR have quit w/in 15years.) does not qualify.   Lung Cancer Screening Referral: N/A  Additional Screening:  Hepatitis C Screening: does qualify; Completed 04/13/2021  Vision Screening: Recommended annual ophthalmology exams for early detection of glaucoma and other  disorders of the eye. Is the patient up to date with their annual eye exam?  Yes  Who is the provider or what is the name of the office in which the patient attends annual eye exams? Dr. Louanna Raw If pt is not established with a provider, would they like to be referred to a provider to establish care? No .   Dental Screening: Recommended annual dental exams for proper oral hygiene   Community Resource Referral / Chronic Care Management: CRR required this visit?  No   CCM required this visit?  No     Plan:     I have personally reviewed and noted the following in the patient's chart:   Medical and social history Use of alcohol, tobacco or illicit drugs  Current medications and supplements including opioid prescriptions. Patient is not currently taking opioid prescriptions. Functional ability and status Nutritional status Physical activity Advanced directives List of other physicians Hospitalizations, surgeries, and ER visits in  previous 12 months Vitals Screenings to include cognitive, depression, and falls Referrals and appointments  In addition, I have reviewed and discussed with patient certain preventive protocols, quality metrics, and best practice recommendations. A written personalized care plan for preventive services as well as general preventive health recommendations were provided to patient.     Janace Decker L Jena Tegeler, CMA   06/16/2023   After Visit Summary: (MyChart) Due to this being a telephonic visit, the after visit summary with patients personalized plan was offered to patient via MyChart   Nurse Notes: A referral has been placed for a DEXA, patient has not had one since 2022.  Her only concerned was wondering if she is over supplementing with Vit D.  She would like to discuss this during her next visit with Dr. Lawerance Bach.  Patient is going to hold off on taking D3 1,000mg  daily until she comes in for her visit in September.

## 2023-06-16 NOTE — Patient Instructions (Signed)
Tracie Hart , Thank you for taking time to come for your Medicare Wellness Visit. I appreciate your ongoing commitment to your health goals. Please review the following plan we discussed and let me know if I can assist you in the future.   Referrals/Orders/Follow-Ups/Clinician Recommendations: A referral has been placed for your bone density screening with [facility].  Remember to call and schedule your screening.    This is a list of the screening recommended for you and due dates:  Health Maintenance  Topic Date Due   Zoster (Shingles) Vaccine (1 of 2) 06/11/1975   COVID-19 Vaccine (3 - Moderna risk series) 08/22/2020   Pneumonia Vaccine (1 of 1 - PCV) Never done   DEXA scan (bone density measurement)  04/18/2023   Flu Shot  06/19/2023   Medicare Annual Wellness Visit  06/15/2024   Mammogram  12/26/2024   DTaP/Tdap/Td vaccine (5 - Td or Tdap) 02/11/2027   Colon Cancer Screening  03/30/2028   Hepatitis C Screening  Completed   HPV Vaccine  Aged Out    Advanced directives: (Copy Requested) Please bring a copy of your health care power of attorney and living will to the office to be added to your chart at your convenience.  Next Medicare Annual Wellness Visit scheduled for next year: No.  Patient would prefer to wait.  Preventive Care 40 Years and Older, Female Preventive care refers to lifestyle choices and visits with your health care provider that can promote health and wellness. What does preventive care include? A yearly physical exam. This is also called an annual well check. Dental exams once or twice a year. Routine eye exams. Ask your health care provider how often you should have your eyes checked. Personal lifestyle choices, including: Daily care of your teeth and gums. Regular physical activity. Eating a healthy diet. Avoiding tobacco and drug use. Limiting alcohol use. Practicing safe sex. Taking low-dose aspirin every day. Taking vitamin and mineral supplements as  recommended by your health care provider. What happens during an annual well check? The services and screenings done by your health care provider during your annual well check will depend on your age, overall health, lifestyle risk factors, and family history of disease. Counseling  Your health care provider may ask you questions about your: Alcohol use. Tobacco use. Drug use. Emotional well-being. Home and relationship well-being. Sexual activity. Eating habits. History of falls. Memory and ability to understand (cognition). Work and work Astronomer. Reproductive health. Screening  You may have the following tests or measurements: Height, weight, and BMI. Blood pressure. Lipid and cholesterol levels. These may be checked every 5 years, or more frequently if you are over 27 years old. Skin check. Lung cancer screening. You may have this screening every year starting at age 66 if you have a 30-pack-year history of smoking and currently smoke or have quit within the past 15 years. Fecal occult blood test (FOBT) of the stool. You may have this test every year starting at age 20. Flexible sigmoidoscopy or colonoscopy. You may have a sigmoidoscopy every 5 years or a colonoscopy every 10 years starting at age 38. Hepatitis C blood test. Hepatitis B blood test. Sexually transmitted disease (STD) testing. Diabetes screening. This is done by checking your blood sugar (glucose) after you have not eaten for a while (fasting). You may have this done every 1-3 years. Bone density scan. This is done to screen for osteoporosis. You may have this done starting at age 81. Mammogram. This may be  done every 1-2 years. Talk to your health care provider about how often you should have regular mammograms. Talk with your health care provider about your test results, treatment options, and if necessary, the need for more tests. Vaccines  Your health care provider may recommend certain vaccines, such  as: Influenza vaccine. This is recommended every year. Tetanus, diphtheria, and acellular pertussis (Tdap, Td) vaccine. You may need a Td booster every 10 years. Zoster vaccine. You may need this after age 70. Pneumococcal 13-valent conjugate (PCV13) vaccine. One dose is recommended after age 75. Pneumococcal polysaccharide (PPSV23) vaccine. One dose is recommended after age 28. Talk to your health care provider about which screenings and vaccines you need and how often you need them. This information is not intended to replace advice given to you by your health care provider. Make sure you discuss any questions you have with your health care provider. Document Released: 12/01/2015 Document Revised: 07/24/2016 Document Reviewed: 09/05/2015 Elsevier Interactive Patient Education  2017 [employer].  Fall Prevention in the Home Falls can cause injuries. They can happen to people of all ages. There are many things you can do to make your home safe and to help prevent falls. What can I do on the outside of my home? Regularly fix the edges of walkways and driveways and fix any cracks. Remove anything that might make you trip as you walk through a door, such as a raised step or threshold. Trim any bushes or trees on the path to your home. Use bright outdoor lighting. Clear any walking paths of anything that might make someone trip, such as rocks or tools. Regularly check to see if handrails are loose or broken. Make sure that both sides of any steps have handrails. Any raised decks and porches should have guardrails on the edges. Have any leaves, snow, or ice cleared regularly. Use sand or salt on walking paths during winter. Clean up any spills in your garage right away. This includes oil or grease spills. What can I do in the bathroom? Use night lights. Install grab bars by the toilet and in the tub and shower. Do not use towel bars as grab bars. Use non-skid mats or decals in the tub or  shower. If you need to sit down in the shower, use a plastic, non-slip stool. Keep the floor dry. Clean up any water that spills on the floor as soon as it happens. Remove soap buildup in the tub or shower regularly. Attach bath mats securely with double-sided non-slip rug tape. Do not have throw rugs and other things on the floor that can make you trip. What can I do in the bedroom? Use night lights. Make sure that you have a light by your bed that is easy to reach. Do not use any sheets or blankets that are too big for your bed. They should not hang down onto the floor. Have a firm chair that has side arms. You can use this for support while you get dressed. Do not have throw rugs and other things on the floor that can make you trip. What can I do in the kitchen? Clean up any spills right away. Avoid walking on wet floors. Keep items that you use a lot in easy-to-reach places. If you need to reach something above you, use a strong step stool that has a grab bar. Keep electrical cords out of the way. Do not use floor polish or wax that makes floors slippery. If you must use wax,  use non-skid floor wax. Do not have throw rugs and other things on the floor that can make you trip. What can I do with my stairs? Do not leave any items on the stairs. Make sure that there are handrails on both sides of the stairs and use them. Fix handrails that are broken or loose. Make sure that handrails are as long as the stairways. Check any carpeting to make sure that it is firmly attached to the stairs. Fix any carpet that is loose or worn. Avoid having throw rugs at the top or bottom of the stairs. If you do have throw rugs, attach them to the floor with carpet tape. Make sure that you have a light switch at the top of the stairs and the bottom of the stairs. If you do not have them, ask someone to add them for you. What else can I do to help prevent falls? Wear shoes that: Do not have high heels. Have  rubber bottoms. Are comfortable and fit you well. Are closed at the toe. Do not wear sandals. If you use a stepladder: Make sure that it is fully opened. Do not climb a closed stepladder. Make sure that both sides of the stepladder are locked into place. Ask someone to hold it for you, if possible. Clearly mark and make sure that you can see: Any grab bars or handrails. First and last steps. Where the edge of each step is. Use tools that help you move around (mobility aids) if they are needed. These include: Canes. Walkers. Scooters. Crutches. Turn on the lights when you go into a dark area. Replace any light bulbs as soon as they burn out. Set up your furniture so you have a clear path. Avoid moving your furniture around. If any of your floors are uneven, fix them. If there are any pets around you, be aware of where they are. Review your medicines with your doctor. Some medicines can make you feel dizzy. This can increase your chance of falling. Ask your doctor what other things that you can do to help prevent falls. This information is not intended to replace advice given to you by your health care provider. Make sure you discuss any questions you have with your health care provider. Document Released: 08/31/2009 Document Revised: 04/11/2016 Document Reviewed: 12/09/2014 Elsevier Interactive Patient Education  2017 [employer].

## 2023-06-16 NOTE — Telephone Encounter (Signed)
Patient informed via my chart.

## 2023-06-23 ENCOUNTER — Inpatient Hospital Stay (HOSPITAL_BASED_OUTPATIENT_CLINIC_OR_DEPARTMENT_OTHER): Admission: RE | Admit: 2023-06-23 | Payer: Medicare HMO | Source: Ambulatory Visit

## 2023-06-26 ENCOUNTER — Ambulatory Visit: Payer: Medicare HMO | Admitting: Podiatry

## 2023-07-02 DIAGNOSIS — G43719 Chronic migraine without aura, intractable, without status migrainosus: Secondary | ICD-10-CM | POA: Diagnosis not present

## 2023-07-02 DIAGNOSIS — G518 Other disorders of facial nerve: Secondary | ICD-10-CM | POA: Diagnosis not present

## 2023-07-02 DIAGNOSIS — M791 Myalgia, unspecified site: Secondary | ICD-10-CM | POA: Diagnosis not present

## 2023-07-02 DIAGNOSIS — M542 Cervicalgia: Secondary | ICD-10-CM | POA: Diagnosis not present

## 2023-07-03 ENCOUNTER — Encounter (INDEPENDENT_AMBULATORY_CARE_PROVIDER_SITE_OTHER): Payer: Self-pay

## 2023-07-04 ENCOUNTER — Encounter: Payer: Self-pay | Admitting: Podiatry

## 2023-07-04 ENCOUNTER — Ambulatory Visit (INDEPENDENT_AMBULATORY_CARE_PROVIDER_SITE_OTHER): Payer: Medicare HMO

## 2023-07-04 ENCOUNTER — Ambulatory Visit: Payer: Medicare HMO | Admitting: Podiatry

## 2023-07-04 DIAGNOSIS — L923 Foreign body granuloma of the skin and subcutaneous tissue: Secondary | ICD-10-CM

## 2023-07-04 DIAGNOSIS — G5791 Unspecified mononeuropathy of right lower limb: Secondary | ICD-10-CM | POA: Diagnosis not present

## 2023-07-04 DIAGNOSIS — M779 Enthesopathy, unspecified: Secondary | ICD-10-CM | POA: Diagnosis not present

## 2023-07-04 MED ORDER — TRIAMCINOLONE ACETONIDE 10 MG/ML IJ SUSP
10.0000 mg | Freq: Once | INTRAMUSCULAR | Status: AC
Start: 2023-07-04 — End: 2023-07-04
  Administered 2023-07-04: 10 mg via INTRA_ARTICULAR

## 2023-07-07 NOTE — Progress Notes (Signed)
Subjective:   Patient ID: Tracie Hart, female   DOB: 67 y.o.   MRN: [id number]   HPI Patient presents with a pain extending from the midfoot distal and states that it feels like an electric shock at times with shoe gear.  Does not remember injury it has been present for several months.  Patient does not smoke likes to be active   Review of Systems  All other systems reviewed and are negative.       Objective:  Physical Exam Vitals and nursing note reviewed.  Constitutional:      Appearance: She is well-developed.  Pulmonary:     Effort: Pulmonary effort is normal.  Musculoskeletal:        General: Normal range of motion.  Skin:    General: Skin is warm.  Neurological:     Mental Status: She is alert.     Neurovascular status intact muscle strength was found to be adequate range of motion adequate with patient noted to have discomfort from the midfoot distal right mostly around the base of the second metatarsal cuneiform and first metatarsal cuneiform joints with no what appears to be bone spur formation associated.  Good digital perfusion well-oriented      Assessment:  Probability for some form of nerve compression but also may be inflammatory with tendinous inflammation      Plan:  H&P reviewed and at this point I went ahead I did sterile prep I did careful injection of the area 3 mg dexamethasone Kenalog 5 mg Xylocaine to reduce inflammation I discussed warm compresses and wearing shoes that do not press and patient will see results and be seen back as symptoms indicate  X-rays were negative for signs of bone spur formation or any other issue from that perspective

## 2023-07-15 ENCOUNTER — Ambulatory Visit (HOSPITAL_BASED_OUTPATIENT_CLINIC_OR_DEPARTMENT_OTHER)
Admission: RE | Admit: 2023-07-15 | Discharge: 2023-07-15 | Disposition: A | Discharge status: Home / Self Care | Payer: Medicare HMO | Source: Ambulatory Visit | Attending: Internal Medicine | Admitting: Internal Medicine

## 2023-07-15 ENCOUNTER — Encounter: Payer: Self-pay | Admitting: Internal Medicine

## 2023-07-15 DIAGNOSIS — M8588 Other specified disorders of bone density and structure, other site: Secondary | ICD-10-CM | POA: Diagnosis not present

## 2023-07-15 DIAGNOSIS — Z0000 Encounter for general adult medical examination without abnormal findings: Secondary | ICD-10-CM | POA: Diagnosis not present

## 2023-07-15 DIAGNOSIS — Z780 Asymptomatic menopausal state: Secondary | ICD-10-CM | POA: Diagnosis not present

## 2023-07-16 ENCOUNTER — Other Ambulatory Visit: Payer: Self-pay

## 2023-07-16 ENCOUNTER — Emergency Department (HOSPITAL_BASED_OUTPATIENT_CLINIC_OR_DEPARTMENT_OTHER)
Admission: EM | Admit: 2023-07-16 | Discharge: 2023-07-16 | Disposition: A | Discharge status: Home / Self Care | Payer: Medicare HMO | Attending: Emergency Medicine | Admitting: Emergency Medicine

## 2023-07-16 ENCOUNTER — Emergency Department (HOSPITAL_BASED_OUTPATIENT_CLINIC_OR_DEPARTMENT_OTHER): Payer: Medicare HMO

## 2023-07-16 DIAGNOSIS — E860 Dehydration: Secondary | ICD-10-CM | POA: Diagnosis not present

## 2023-07-16 DIAGNOSIS — K2900 Acute gastritis without bleeding: Secondary | ICD-10-CM | POA: Insufficient documentation

## 2023-07-16 DIAGNOSIS — E119 Type 2 diabetes mellitus without complications: Secondary | ICD-10-CM | POA: Insufficient documentation

## 2023-07-16 DIAGNOSIS — R197 Diarrhea, unspecified: Secondary | ICD-10-CM | POA: Diagnosis not present

## 2023-07-16 DIAGNOSIS — E039 Hypothyroidism, unspecified: Secondary | ICD-10-CM | POA: Insufficient documentation

## 2023-07-16 DIAGNOSIS — R109 Unspecified abdominal pain: Secondary | ICD-10-CM

## 2023-07-16 DIAGNOSIS — Z853 Personal history of malignant neoplasm of breast: Secondary | ICD-10-CM | POA: Insufficient documentation

## 2023-07-16 DIAGNOSIS — N200 Calculus of kidney: Secondary | ICD-10-CM | POA: Diagnosis not present

## 2023-07-16 DIAGNOSIS — Z79890 Hormone replacement therapy (postmenopausal): Secondary | ICD-10-CM | POA: Insufficient documentation

## 2023-07-16 DIAGNOSIS — R1013 Epigastric pain: Secondary | ICD-10-CM | POA: Diagnosis not present

## 2023-07-16 DIAGNOSIS — K8689 Other specified diseases of pancreas: Secondary | ICD-10-CM | POA: Diagnosis not present

## 2023-07-16 DIAGNOSIS — R112 Nausea with vomiting, unspecified: Secondary | ICD-10-CM

## 2023-07-16 DIAGNOSIS — R188 Other ascites: Secondary | ICD-10-CM | POA: Diagnosis not present

## 2023-07-16 LAB — CBC
HCT: 45.8 % (ref 36.0–46.0)
Hemoglobin: 15.4 g/dL — ABNORMAL HIGH (ref 12.0–15.0)
MCH: 27.9 pg (ref 26.0–34.0)
MCHC: 33.6 g/dL (ref 30.0–36.0)
MCV: 83 fL (ref 80.0–100.0)
Platelets: 189 K/uL (ref 150–400)
RBC: 5.52 MIL/uL — ABNORMAL HIGH (ref 3.87–5.11)
RDW: 13.1 % (ref 11.5–15.5)
WBC: 7.6 K/uL (ref 4.0–10.5)
nRBC: 0 % (ref 0.0–0.2)

## 2023-07-16 LAB — COMPREHENSIVE METABOLIC PANEL WITH GFR
ALT: 16 U/L (ref 0–44)
AST: 21 U/L (ref 15–41)
Albumin: 5.2 g/dL — ABNORMAL HIGH (ref 3.5–5.0)
Alkaline Phosphatase: 55 U/L (ref 38–126)
Anion gap: 13 (ref 5–15)
BUN: 24 mg/dL — ABNORMAL HIGH (ref 8–23)
CO2: 29 mmol/L (ref 22–32)
Calcium: 10.9 mg/dL — ABNORMAL HIGH (ref 8.9–10.3)
Chloride: 100 mmol/L (ref 98–111)
Creatinine, Ser: 0.85 mg/dL (ref 0.44–1.00)
GFR, Estimated: >60 mL/min
Glucose, Bld: 119 mg/dL — ABNORMAL HIGH (ref 70–99)
Potassium: 3.5 mmol/L (ref 3.5–5.1)
Sodium: 142 mmol/L (ref 135–145)
Total Bilirubin: 0.7 mg/dL (ref 0.3–1.2)
Total Protein: 8.1 g/dL (ref 6.5–8.1)

## 2023-07-16 LAB — URINALYSIS, ROUTINE W REFLEX MICROSCOPIC
Bacteria, UA: NONE SEEN
Bilirubin Urine: NEGATIVE
Glucose, UA: NEGATIVE mg/dL
Ketones, ur: 15 mg/dL — AB
Leukocytes,Ua: NEGATIVE
Nitrite: NEGATIVE
Specific Gravity, Urine: 1.018 (ref 1.005–1.030)
pH: 7 (ref 5.0–8.0)

## 2023-07-16 LAB — LIPASE, BLOOD: Lipase: <10 U/L — ABNORMAL LOW (ref 11–51)

## 2023-07-16 MED ORDER — ONDANSETRON HCL 4 MG/2ML IJ SOLN
4.0000 mg | Freq: Once | INTRAMUSCULAR | Status: AC
  Administered 2023-07-16: 4 mg via INTRAVENOUS
  Filled 2023-07-16: qty 2

## 2023-07-16 MED ORDER — PROCHLORPERAZINE EDISYLATE 10 MG/2ML IJ SOLN
10.0000 mg | Freq: Once | INTRAMUSCULAR | Status: AC
  Administered 2023-07-16: 10 mg via INTRAVENOUS
  Filled 2023-07-16: qty 2

## 2023-07-16 MED ORDER — ONDANSETRON 4 MG PO TBDP: 4.0000 mg | ORAL_TABLET | Freq: Three times a day (TID) | ORAL | 0 refills | Status: AC | PRN

## 2023-07-16 MED ORDER — IOHEXOL 300 MG/ML  SOLN
100.0000 mL | Freq: Once | INTRAMUSCULAR | Status: AC | PRN
  Administered 2023-07-16: 80 mL via INTRAVENOUS

## 2023-07-16 MED ORDER — DIPHENHYDRAMINE HCL 50 MG/ML IJ SOLN
50.0000 mg | Freq: Once | INTRAMUSCULAR | Status: AC
  Administered 2023-07-16: 50 mg via INTRAVENOUS
  Filled 2023-07-16: qty 1

## 2023-07-16 MED ORDER — KETOROLAC TROMETHAMINE 15 MG/ML IJ SOLN
15.0000 mg | Freq: Once | INTRAMUSCULAR | Status: AC
  Administered 2023-07-16: 15 mg via INTRAVENOUS
  Filled 2023-07-16: qty 1

## 2023-07-16 MED ORDER — SODIUM CHLORIDE 0.9 % IV BOLUS
1000.0000 mL | Freq: Once | INTRAVENOUS | Status: AC
  Administered 2023-07-16: 1000 mL via INTRAVENOUS

## 2023-07-16 MED ORDER — SUCRALFATE 1 G PO TABS: 1.0000 g | ORAL_TABLET | Freq: Three times a day (TID) | ORAL | 0 refills | Status: DC

## 2023-07-16 NOTE — ED Triage Notes (Signed)
Pt via pov from home with emesis and abdominal pain x 3 days. She reports that migraine started last night; has taken imitrex with no relief. Pt alert & oriented, nad noted.

## 2023-07-16 NOTE — ED Notes (Signed)
Pt received AVS; Verbalized understanding of d/c instructions.had no further questions upon discharge.

## 2023-07-16 NOTE — Discharge Instructions (Signed)
Your history, exam, evaluation today are consistent with a likely gastritis or gastroenteritis causing the GI symptoms and discomfort.  This likely to dehydration and triggering your headache.  As your symptoms improved and workup otherwise reassuring, we feel you are safe for discharge home.  Please use your home reflux medicine and use the Carafate as well.  Please use the nausea medicine to help maintain hydration.  Please follow-up with your primary doctor.  If any symptoms change or worsen acutely, please return to the nearest emergency department.

## 2023-07-16 NOTE — ED Provider Notes (Signed)
Trac[facility] EMERGENCY DEPARTMENT AT [facility] Provider Note   CSN: [id number] Arrival date & time: 07/16/23  1400     History  Chief Complaint  Patient presents with   Abdominal Pain   Emesis    Tracie Hart is a 67 y.o. female.  The history is provided by the patient and medical records. No language interpreter was used.  Abdominal Pain Pain location:  Epigastric, RUQ and LUQ Pain quality: aching and cramping   Pain radiates to:  Does not radiate Pain severity:  Moderate Onset quality:  Gradual Duration:  3 days Timing:  Constant Progression:  Waxing and waning Chronicity:  New Context: alcohol use   Relieved by:  Nothing Associated symptoms: diarrhea, nausea and vomiting   Associated symptoms: no chest pain, no chills, no constipation, no cough, no dysuria, no fatigue, no fever and no shortness of breath   Emesis Associated symptoms: abdominal pain and diarrhea   Associated symptoms: no chills, no cough, no fever and no headaches        Home Medications Prior to Admission medications   Medication Sig Start Date End Date Taking? Authorizing Provider  BOTOX 100 units SOLR injection  07/31/22   [provider]  calcium citrate (CALCITRATE - DOSED IN MG ELEMENTAL CALCIUM) 950 (200 Ca) MG tablet Take by mouth. 06/14/20   [provider]  ibuprofen (ADVIL) 600 MG tablet Take by mouth.    [provider]  levothyroxine (SYNTHROID) 50 MCG tablet TAKE 1 TABLET(50 MCG) BY MOUTH DAILY BEFORE BREAKFAST 06/05/23   Burns, Bobette Mo, MD  omeprazole (PRILOSEC) 20 MG capsule TAKE 1 CAPSULE(20 MG) BY MOUTH DAILY 12/02/22   Pincus Sanes, MD  PARoxetine (PAXIL) 20 MG tablet Take 1 tablet (20 mg total) by mouth daily. Annual appt due in Aug must see provider for future refills 06/06/23   Pincus Sanes, MD  SUMAtriptan (IMITREX) 100 MG tablet TAKE 1 TABLET BY MOUTH EVERY 2 HOURS AS NEEDED FOR MIGRAINE. MAY REPEAT IN 2 HOURS IF HEADACHE PERSISTS OR  RECURS 01/01/22   Etta Grandchild, MD  vitamin B-12 (CYANOCOBALAMIN) 500 MCG tablet Take 1,000 mcg by mouth daily.     [provider]      Allergies    Codeine    Review of Systems   Review of Systems  Constitutional:  Negative for chills, fatigue and fever.  HENT:  Negative for congestion.   Respiratory:  Negative for cough, chest tightness, shortness of breath and wheezing.   Cardiovascular:  Negative for chest pain.  Gastrointestinal:  Positive for abdominal pain, diarrhea, nausea and vomiting. Negative for constipation.  Genitourinary:  Negative for dysuria.  Musculoskeletal:  Negative for back pain.  Neurological:  Negative for headaches.  Psychiatric/Behavioral:  Negative for agitation.   All other systems reviewed and are negative.   Physical Exam Updated Vital Signs BP (!) 180/114 (BP Location: Right Arm)   Pulse 99   Temp 98.4 F (36.9 C)   Resp 18   Ht 5\' 4"  (1.626 m)   Wt 63.5 kg   SpO2 97%   BMI 24.03 kg/m  Physical Exam Vitals and nursing note reviewed.  Constitutional:      General: She is not in acute distress.    Appearance: She is well-developed. She is not ill-appearing, toxic-appearing or diaphoretic.  HENT:     Head: Normocephalic and atraumatic.     Nose: No congestion or rhinorrhea.     Mouth/Throat:  Mouth: Mucous membranes are dry.  Eyes:     Extraocular Movements: Extraocular movements intact.     Conjunctiva/sclera: Conjunctivae normal.     Pupils: Pupils are equal, round, and reactive to light.  Cardiovascular:     Rate and Rhythm: Normal rate and regular rhythm.     Heart sounds: No murmur heard. Pulmonary:     Effort: Pulmonary effort is normal. No respiratory distress.     Breath sounds: Normal breath sounds.  Abdominal:     General: Abdomen is flat.     Palpations: Abdomen is soft.     Tenderness: There is abdominal tenderness in the right upper quadrant, epigastric area and left upper quadrant. There is no right CVA  tenderness, left CVA tenderness, guarding or rebound.  Musculoskeletal:        General: No swelling or tenderness.     Cervical back: Neck supple.     Right lower leg: No edema.     Left lower leg: No edema.  Skin:    General: Skin is warm and dry.     Capillary Refill: Capillary refill takes less than 2 seconds.     Findings: No erythema.  Neurological:     General: No focal deficit present.     Mental Status: She is alert.  Psychiatric:        Mood and Affect: Mood normal.     ED Results / Procedures / Treatments   Labs (all labs ordered are listed, but only abnormal results are displayed) Labs Reviewed  LIPASE, BLOOD - Abnormal; Notable for the following components:      Result Value   Lipase <10 (*)    All other components within normal limits  COMPREHENSIVE METABOLIC PANEL - Abnormal; Notable for the following components:   Glucose, Bld 119 (*)    BUN 24 (*)    Calcium 10.9 (*)    Albumin 5.2 (*)    All other components within normal limits  CBC - Abnormal; Notable for the following components:   RBC 5.52 (*)    Hemoglobin 15.4 (*)    All other components within normal limits  URINALYSIS, ROUTINE W REFLEX MICROSCOPIC - Abnormal; Notable for the following components:   Hgb urine dipstick SMALL (*)    Ketones, ur 15 (*)    Protein, ur TRACE (*)    All other components within normal limits    EKG None  Radiology CT ABDOMEN PELVIS W CONTRAST  Result Date: 07/16/2023 CLINICAL DATA:  Upper abdominal pain with nausea and vomiting. Some loose diarrhea. Previous cholecystectomy. EXAM: CT ABDOMEN AND PELVIS WITH CONTRAST TECHNIQUE: Multidetector CT imaging of the abdomen and pelvis was performed using the standard protocol following bolus administration of intravenous contrast. RADIATION DOSE REDUCTION: This exam was performed according to the departmental dose-optimization program which includes automated exposure control, adjustment of the mA and/or kV according to  patient size and/or use of iterative reconstruction technique. CONTRAST:  80mL OMNIPAQUE IOHEXOL 300 MG/ML  SOLN COMPARISON:  CT 12/30/2018.  Calcium scoring CT 07/24/2022 FINDINGS: Lower chest: Small pericardial effusion. Heart is borderline enlarged mild basilar atelectasis. No pleural effusion Hepatobiliary: No focal liver abnormality is seen. Status post cholecystectomy. No biliary dilatation. Patent portal vein. Pancreas: Moderate atrophy of the pancreas. No obvious pancreatic mass. Spleen: Normal in size without focal abnormality. Adrenals/Urinary Tract: Adrenal glands are preserved. Punctate nonobstructing lower pole right-sided renal stone. No enhancing renal mass or collecting system dilatation. The ureters have normal course and caliber extending  down to the bladder. Preserved contours of the urinary bladder. Stomach/Bowel: On this non oral contrast exam, the stomach has a normal course and caliber with moderate luminal fluid. Slight distal gastric wall thickening. The small bowel is nondilated. There are several loops of small bowel with fluid on the left side, jejunum. Large bowel has some scattered colonic stool. There is a redundant course of the transverse colon extending down to the pelvis. Normal appendix extends medial to the cecum in the right lower quadrant. Vascular/Lymphatic: Aortic atherosclerosis. No enlarged abdominal or pelvic lymph nodes. Reproductive: Status post hysterectomy. There is a simple appearing cystic lesion in the right pelvic sidewall. This could be ovarian or paraovarian. Today this measures 4.2 x 3.3 cm. Previously 5.2 x 3.0 cm. This has demonstrated over 4 years of stability. No additional imaging follow-up. Other: No free intra-air. No free fluid. There is motion seen throughout the examination. Musculoskeletal: Scattered degenerative changes. There is compression deformity of the superior endplate of L1 with some sclerosis this has not seen in 2020. IMPRESSION: No bowel  obstruction, free air or free fluid.  Normal appendix. There is moderate fluid in the lumen of the stomach there are some fluid-filled loops of proximal small bowel. Slight distal gastric wall thickening. Please correlate with any symptoms. Slight cardiac enlargement with a small pericardial effusion Motion Electronically Signed   By: Zalia Kays M.D.   On: 07/16/2023 18:12   DG Bone Density  Result Date: 07/15/2023 EXAM: DUAL X-RAY ABSORPTIOMETRY (DXA) FOR BONE MINERAL DENSITY IMPRESSION: Patient: Amada Jupiter Referring Physician: Bobette Mo BURNS Birth Date: 1955-12-09 [age] Patient ID: [id number] Height: 64.0 in. Weight: 140.0 lbs. Measured: 07/15/2023 11:30:44 AM (18 SP 3) Sex: Female Ethnicity: White Analyzed: 07/15/2023 11:41:19 AM (18 SP 3) FRAX* Based on femoral neck BMD: DualFemur (Right) 10-year Probability of Fracture Major Osteoporotic Fracture: 6.5 % Hip Fracture:                0.2 % Population:                  [place] (Caucasian) Risk Factors:                None *FRAX is a Armed forces logistics/support/administrative officer of the [employer] for Metabolic Bone Diseases, a World Science writer (WHO) [employer] [phone]). Referring Physician:  Bobette Mo BURNS Your patient completed a bone mineral density test using GE Lunar iDXA system (analysis version: 16). Technologist: ALW PATIENT: Name: Gela, Rooth Patient ID:  [id number] Birth Date: 1956/07/06 Height:     64.0 in. Sex:         Female Measured:   07/15/2023 Weight:     140.0 lbs. Indications: Bilateral Ovariectomy, Estrogen Deficiency, History of osteopenia, Hysterectomy, Pantoprazole, Post Menopausal Fractures: Treatments: Calcium, Vitamin D ASSESSMENT: The BMD measured at AP Spine L2-L4 is 1.000 g/cm2 with a T-score of -1.7. This patient is considered to have osteopenia/low bone mass according to World Health Organization [facility]) criteria. The scan quality is good. L-1 was excluded due to unknown object over  verterbra. Site Region Measured Date Measured Age YA BMD Significant CHANGE T-score AP Spine  L2-L4      07/15/20[age]         -1.7    1.000 g/cm2 DualFemur Neck Right 07/15/2023    [age]         0.3     1.074 g/cm2 DualFemur  Total Mean 07/15/2023    [age]         0.7     1.091 g/cm2 World Health Organization [facility]) criteria for post-menopausal, Caucasian Women: Normal       T-score at or above -1 SD Osteopenia   T-score between -1 and -2.5 SD Osteoporosis T-score at or below -2.5 SD RECOMMENDATION: 1. All patients should optimize calcium and vitamin D intake. 2. Consider FDA approved medical therapies in postmenopausal women and men aged 76 years and older, based on the following: a. A hip or vertebral (clinical or morphometric) fracture b. T-score = -2.5 at the femoral neck or spine after appropriate evaluation to exclude secondary causes c. Low bone mass (T-score between -1.0 and -2.5 at the femoral neck or spine) and a 10- year probability of a hip fracture = 3% or a 10 year probability of a major osteoporosis-related fracture = 20% based on the [place]-adapted WHO algorithm. 3. Clinician judgement and/or patient preference may indicate treatment for people with 10-year fracture probabilities above or below these levels. FOLLOW-UP: Patients with diagnosis of osteoporosis or at high risk for fracture should have regular bone mineral density tests . For patients eligible for Medicare routine testing is allowed once every 2 years. The testing frequency can be increased to one year for patients who have rapidly progressing disease, those who are receiving or discontinuing medical therapy to restore bone mass, or have additional risk factors. I have reviewed this study and agree with the findings. [facility] Electronically Signed   By: Frederico Hamman M.D.   On: 07/15/2023 12:27    Procedures Procedures    Medications Ordered in ED Medications  ondansetron (ZOFRAN) injection 4 mg (4 mg  Intravenous Given 07/16/23 1541)  prochlorperazine (COMPAZINE) injection 10 mg (10 mg Intravenous Given 07/16/23 1722)  diphenhydrAMINE (BENADRYL) injection 50 mg (50 mg Intravenous Given 07/16/23 1724)  sodium chloride 0.9 % bolus 1,000 mL ( Intravenous Stopped 07/16/23 1844)  ketorolac (TORADOL) 15 MG/ML injection 15 mg (15 mg Intravenous Given 07/16/23 1723)  iohexol (OMNIPAQUE) 300 MG/ML solution 100 mL (80 mLs Intravenous Contrast Given 07/16/23 1733)    ED Course/ Medical Decision Making/ A&P                                 Medical Decision Making Amount and/or Complexity of Data Reviewed Labs: ordered. Radiology: ordered.  Risk Prescription drug management.     Tracie Hart is a 68 y.o. female with a past medical history significant for diabetes, migraines, GERD, hypothyroidism, osteopenia, previous breast cancer, anxiety, depression, and previous neurofibroma excision who presents with headache, abdominal pain, nausea vomiting, and diarrhea.  According to patient, she had 2 drinks alcohol last night which she has not done in quite some time and then overnight started having abdominal discomfort across her upper abdomen.  She started having nausea, vomiting, diarrhea that was not bloody and then started having headache overnight.  She reports she is having severe headache with photophobia and phonophobia similar to prior migraines.  She tried taking Imitrex today without relief which is different than normal.  She denies any trauma.  Denies any speech difficulties or vision changes.  Denies any focal neurologic complaints.  She reports her upper abdomen is very painful and tender and she is not able to keep much down orally.  She reports she has had nonbloody diarrhea but no constipation.  No urinary changes.  She has a dry mouth and feels dehydrated.  Otherwise no flank pain or back pain.  She does report a family history of pancreatitis and wanted to have that checked as  well.  On exam, lungs clear.  Chest nontender.  Upper abdomen is tender across her abdomen and she has had a previous cholecystectomy.  Bowel sounds were appreciated.  Back and flanks nontender.  No focal neurologic deficits appreciated.  Because membranes are quite dry.  Blood pressure is elevated but that is improving compared to what it was earlier reportedly.  Clinically I do feel we need to get a CT of the pelvis to rule out partial structure or other intra-abdominal pathology but I suspect she may have alcohol related gastritis due to drinking alcohol with first time in a while in the setting of GERD because of the nausea and vomiting, abdominal pain, possible diarrhea.  She could also of a viral GI bug.  Will get CT imaging of the abdomen pelvis and will give her headache cocktail and rehydrate her.  I suspect the fluid loss led to dehydration contribute to the headache.  Given her lack of focal neurologic deficit we will hold on CT of the head.  She also says that she was told yesterday that may be piece of metal in her back but she is not having back pain.  Will get the CT on pelvis to rule out concerning etiologies and if she is feeling better, dissipate discharge home.  7:56 PM Patient reports headache has nearly resolved.  Abdomen is feeling better as well.  CT scan shows some wall thickening in the distal stomach and early small bowel.  Suspect some gastritis and enteritis causing her discomfort nausea and vomiting leading to dehydration and headache.  Her urine did show ketones and she is slightly hemoconcentrated.  As she is feeling much better, she would like to go home.  Will give her prescription for Carafate to use with her Prilosec at home and will also give her a prescription for nausea medicine.  Patient will follow with PCP and understood return precautions.  Patient will be discharged.         Final Clinical Impression(s) / ED Diagnoses Final diagnoses:  Acute  gastritis without hemorrhage, unspecified gastritis type  Abdominal pain, unspecified abdominal location  Nausea vomiting and diarrhea  Dehydration    Rx / DC Orders ED Discharge Orders          Ordered    sucralfate (CARAFATE) 1 g tablet  3 times daily with meals & bedtime        07/16/23 1958    ondansetron (ZOFRAN-ODT) 4 MG disintegrating tablet  Every 8 hours PRN        07/16/23 1958           Clinical Impression: 1. Acute gastritis without hemorrhage, unspecified gastritis type   2. Abdominal pain, unspecified abdominal location   3. Nausea vomiting and diarrhea   4. Dehydration     Disposition: Discharge  Condition: Good  I have discussed the results, Dx and Tx plan with the pt(& family if present). He/she/they expressed understanding and agree(s) with the plan. Discharge instructions discussed at great length. Strict return precautions discussed and pt &/or family have verbalized understanding of the instructions. No further questions at time of discharge.    Discharge Medication List as of 07/16/2023  7:59 PM     START taking these medications   Details  ondansetron (ZOFRAN-ODT) 4 MG disintegrating tablet  Take 1 tablet (4 mg total) by mouth every 8 (eight) hours as needed for nausea or vomiting., Starting Wed 07/16/2023, Print    sucralfate (CARAFATE) 1 g tablet Take 1 tablet (1 g total) by mouth 4 (four) times daily -  with meals and at bedtime., Starting Wed 07/16/2023, Print        Follow Up: Pincus Sanes, MD [address] [place] [address] [phone]        Navdeep Halt, Canary Brim, MD 07/16/23 [phone]

## 2023-07-22 ENCOUNTER — Ambulatory Visit: Payer: Medicare HMO | Admitting: Family Medicine

## 2023-07-22 ENCOUNTER — Other Ambulatory Visit (INDEPENDENT_AMBULATORY_CARE_PROVIDER_SITE_OTHER): Payer: Medicare HMO

## 2023-07-22 DIAGNOSIS — E7849 Other hyperlipidemia: Secondary | ICD-10-CM | POA: Diagnosis not present

## 2023-07-22 DIAGNOSIS — E038 Other specified hypothyroidism: Secondary | ICD-10-CM

## 2023-07-22 DIAGNOSIS — R7303 Prediabetes: Secondary | ICD-10-CM

## 2023-07-22 DIAGNOSIS — M8588 Other specified disorders of bone density and structure, other site: Secondary | ICD-10-CM | POA: Diagnosis not present

## 2023-07-22 LAB — CBC WITH DIFFERENTIAL/PLATELET
Basophils Absolute: 0 10*3/uL (ref 0.0–0.1)
Basophils Relative: 0.4 % (ref 0.0–3.0)
Eosinophils Absolute: 0.1 10*3/uL (ref 0.0–0.7)
Eosinophils Relative: 1.5 % (ref 0.0–5.0)
HCT: 41.9 % (ref 36.0–46.0)
Hemoglobin: 13.7 g/dL (ref 12.0–15.0)
Lymphocytes Relative: 22.1 % (ref 12.0–46.0)
Lymphs Abs: 1.4 10*3/uL (ref 0.7–4.0)
MCHC: 32.6 g/dL (ref 30.0–36.0)
MCV: 84.7 fl (ref 78.0–100.0)
Monocytes Absolute: 0.4 10*3/uL (ref 0.1–1.0)
Monocytes Relative: 6.9 % (ref 3.0–12.0)
Neutro Abs: 4.4 10*3/uL (ref 1.4–7.7)
Neutrophils Relative %: 69.1 % (ref 43.0–77.0)
Platelets: 146 10*3/uL — ABNORMAL LOW (ref 150.0–400.0)
RBC: 4.95 Mil/uL (ref 3.87–5.11)
RDW: 13.8 % (ref 11.5–15.5)
WBC: 6.4 10*3/uL (ref 4.0–10.5)

## 2023-07-22 LAB — COMPREHENSIVE METABOLIC PANEL
ALT: 15 U/L (ref 0–35)
AST: 19 U/L (ref 0–37)
Albumin: 4.1 g/dL (ref 3.5–5.2)
Alkaline Phosphatase: 59 U/L (ref 39–117)
BUN: 18 mg/dL (ref 6–23)
CO2: 27 meq/L (ref 19–32)
Calcium: 9.4 mg/dL (ref 8.4–10.5)
Chloride: 102 meq/L (ref 96–112)
Creatinine, Ser: 0.83 mg/dL (ref 0.40–1.20)
GFR: 73.11 mL/min (ref >60.00)
Glucose, Bld: 97 mg/dL (ref 70–99)
Potassium: 3.9 meq/L (ref 3.5–5.1)
Sodium: 137 meq/L (ref 135–145)
Total Bilirubin: 0.5 mg/dL (ref 0.2–1.2)
Total Protein: 6.7 g/dL (ref 6.0–8.3)

## 2023-07-22 LAB — LIPID PANEL
Cholesterol: 209 mg/dL — ABNORMAL HIGH (ref 0–200)
HDL: 45.3 mg/dL (ref >39.00)
LDL Cholesterol: 128 mg/dL — ABNORMAL HIGH (ref 0–99)
NonHDL: 163.35
Total CHOL/HDL Ratio: 5
Triglycerides: 179 mg/dL — ABNORMAL HIGH (ref 0.0–149.0)
VLDL: 35.8 mg/dL (ref 0.0–40.0)

## 2023-07-22 LAB — HEMOGLOBIN A1C: Hgb A1c MFr Bld: 5.8 % (ref 4.6–6.5)

## 2023-07-23 LAB — VITAMIN D 25 HYDROXY (VIT D DEFICIENCY, FRACTURES): VITD: 45.04 ng/mL (ref 30.00–100.00)

## 2023-07-23 LAB — TSH: TSH: 3.04 u[IU]/mL (ref 0.35–5.50)

## 2023-07-25 ENCOUNTER — Ambulatory Visit: Payer: Medicare HMO | Admitting: Internal Medicine

## 2023-07-28 ENCOUNTER — Ambulatory Visit: Payer: Medicare HMO | Admitting: Internal Medicine

## 2023-07-31 ENCOUNTER — Telehealth: Payer: Self-pay

## 2023-07-31 NOTE — Telephone Encounter (Signed)
Transition Care Management Unsuccessful Follow-up Telephone Call  Date of discharge and from where:  [facility] 8/28  Attempts:  1st Attempt  Reason for unsuccessful TCM follow-up call:  No answer/busy   Lenard Forth [place]  [facility], [facility] Guide, Phone: [phone] Website: [email or web address]

## 2023-08-01 ENCOUNTER — Telehealth: Payer: Self-pay

## 2023-08-01 NOTE — Telephone Encounter (Signed)
Transition Care Management Unsuccessful Follow-up Telephone Call  Date of discharge and from where:  [facility] 8/28  Attempts:  2nd Attempt  Reason for unsuccessful TCM follow-up call:  No answer/busy   Lenard Forth [place]  [facility], [facility] Guide, Phone: [phone] Website: [email or web address]

## 2023-08-04 NOTE — Patient Instructions (Addendum)
Medications changes include :   sulcrafate 4 times daily - with meals and at bedtime, omeprazole 40 mg daily    An Echo of your heart  ordered and someone will call you to schedule an appointment.     Return in about 1 year (around 08/04/2024) for Physical Exam.    Health Maintenance, Female Adopting a healthy lifestyle and getting preventive care are important in promoting health and wellness. Ask your health care provider about: The right schedule for you to have regular tests and exams. Things you can do on your own to prevent diseases and keep yourself healthy. What should I know about diet, weight, and exercise? Eat a healthy diet  Eat a diet that includes plenty of vegetables, fruits, low-fat dairy products, and lean protein. Do not eat a lot of foods that are high in solid fats, added sugars, or sodium. Maintain a healthy weight Body mass index (BMI) is used to identify weight problems. It estimates body fat based on height and weight. Your health care provider can help determine your BMI and help you achieve or maintain a healthy weight. Get regular exercise Get regular exercise. This is one of the most important things you can do for your health. Most adults should: Exercise for at least 150 minutes each week. The exercise should increase your heart rate and make you sweat (moderate-intensity exercise). Do strengthening exercises at least twice a week. This is in addition to the moderate-intensity exercise. Spend less time sitting. Even light physical activity can be beneficial. Watch cholesterol and blood lipids Have your blood tested for lipids and cholesterol at 67 years of age, then have this test every 5 years. Have your cholesterol levels checked more often if: Your lipid or cholesterol levels are high. You are older than 67 years of age. You are at high risk for heart disease. What should I know about cancer screening? Depending on your health history and  family history, you may need to have cancer screening at various ages. This may include screening for: Breast cancer. Cervical cancer. Colorectal cancer. Skin cancer. Lung cancer. What should I know about heart disease, diabetes, and high blood pressure? Blood pressure and heart disease High blood pressure causes heart disease and increases the risk of stroke. This is more likely to develop in people who have high blood pressure readings or are overweight. Have your blood pressure checked: Every 3-5 years if you are 52-6 years of age. Every year if you are 1 years old or older. Diabetes Have regular diabetes screenings. This checks your fasting blood sugar level. Have the screening done: Once every three years after age 56 if you are at a normal weight and have a low risk for diabetes. More often and at a younger age if you are overweight or have a high risk for diabetes. What should I know about preventing infection? Hepatitis B If you have a higher risk for hepatitis B, you should be screened for this virus. Talk with your health care provider to find out if you are at risk for hepatitis B infection. Hepatitis C Testing is recommended for: Everyone born from [birth year] through 1965. Anyone with known risk factors for hepatitis C. Sexually transmitted infections (STIs) Get screened for STIs, including gonorrhea and chlamydia, if: You are sexually active and are younger than 67 years of age. You are older than 67 years of age and your health care provider tells you that you are at risk for this  type of infection. Your sexual activity has changed since you were last screened, and you are at increased risk for chlamydia or gonorrhea. Ask your health care provider if you are at risk. Ask your health care provider about whether you are at high risk for HIV. Your health care provider may recommend a prescription medicine to help prevent HIV infection. If you choose to take medicine to prevent HIV,  you should first get tested for HIV. You should then be tested every 3 months for as long as you are taking the medicine. Pregnancy If you are about to stop having your period (premenopausal) and you may become pregnant, seek counseling before you get pregnant. Take 400 to 800 micrograms (mcg) of folic acid every day if you become pregnant. Ask for birth control (contraception) if you want to prevent pregnancy. Osteoporosis and menopause Osteoporosis is a disease in which the bones lose minerals and strength with aging. This can result in bone fractures. If you are 51 years old or older, or if you are at risk for osteoporosis and fractures, ask your health care provider if you should: Be screened for bone loss. Take a calcium or vitamin D supplement to lower your risk of fractures. Be given hormone replacement therapy (HRT) to treat symptoms of menopause. Follow these instructions at home: Alcohol use Do not drink alcohol if: Your health care provider tells you not to drink. You are pregnant, may be pregnant, or are planning to become pregnant. If you drink alcohol: Limit how much you have to: 0-1 drink a day. Know how much alcohol is in your drink. In the [place] one drink equals one 12 oz bottle of beer (355 mL), one 5 oz glass of wine (148 mL), or one 1 oz glass of hard liquor (44 mL). Lifestyle Do not use any products that contain nicotine or tobacco. These products include cigarettes, chewing tobacco, and vaping devices, such as e-cigarettes. If you need help quitting, ask your health care provider. Do not use street drugs. Do not share needles. Ask your health care provider for help if you need support or information about quitting drugs. General instructions Schedule regular health, dental, and eye exams. Stay current with your vaccines. Tell your health care provider if: You often feel depressed. You have ever been abused or do not feel safe at home. Summary Adopting a healthy  lifestyle and getting preventive care are important in promoting health and wellness. Follow your health care provider's instructions about healthy diet, exercising, and getting tested or screened for diseases. Follow your health care provider's instructions on monitoring your cholesterol and blood pressure. This information is not intended to replace advice given to you by your health care provider. Make sure you discuss any questions you have with your health care provider. Document Revised: 03/26/2021 Document Reviewed: 03/26/2021 Elsevier Patient Education  2024 [employer].

## 2023-08-04 NOTE — Progress Notes (Unsigned)
Subjective:    Patient ID: Tracie Hart, female    DOB: 03-23-1956, 67 y.o.   MRN: [id number]      HPI Tracie Hart is here for a Physical exam and her chronic medical problems.   Recent Ct scan stated enlarged heart which concerned her -   ED  8/28 for acute gastritis w/o bleeding - upper abd pain x 3 days, associated N/V/D - dx gastritis and started on zofran, sucralfate.  CT scan done.  Stomach still hurts - dull ache.  She is going to bed at 8 pm.  She is fatigued.    Pain left epigastric region.  Can wake her up at night.  May be slightly worse when eating-not completely sure.  Typically eats bland anyways.   Takes ibuprofen/Aleve 5-6 times a month   No energy, stomach hurting.     Medications and allergies reviewed with patient and updated if appropriate.  Current Outpatient Medications on File Prior to Visit  Medication Sig Dispense Refill   BOTOX 100 units SOLR injection      calcium citrate (CALCITRATE - DOSED IN MG ELEMENTAL CALCIUM) 950 (200 Ca) MG tablet Take by mouth.     ibuprofen (ADVIL) 600 MG tablet Take by mouth.     levothyroxine (SYNTHROID) 50 MCG tablet TAKE 1 TABLET(50 MCG) BY MOUTH DAILY BEFORE BREAKFAST 90 tablet 3   omeprazole (PRILOSEC) 20 MG capsule TAKE 1 CAPSULE(20 MG) BY MOUTH DAILY 30 capsule 3   ondansetron (ZOFRAN-ODT) 4 MG disintegrating tablet Take 1 tablet (4 mg total) by mouth every 8 (eight) hours as needed for nausea or vomiting. 20 tablet 0   PARoxetine (PAXIL) 20 MG tablet Take 1 tablet (20 mg total) by mouth daily. Annual appt due in Aug must see provider for future refills 30 tablet 0   SUMAtriptan (IMITREX) 100 MG tablet TAKE 1 TABLET BY MOUTH EVERY 2 HOURS AS NEEDED FOR MIGRAINE. MAY REPEAT IN 2 HOURS IF HEADACHE PERSISTS OR RECURS 10 tablet 8   vitamin B-12 (CYANOCOBALAMIN) 500 MCG tablet Take 1,000 mcg by mouth daily.      No current facility-administered medications on file prior to visit.    Review of Systems   Constitutional:  Positive for fatigue. Negative for fever.  HENT:  Positive for postnasal drip (in morning).   Eyes:  Negative for visual disturbance.  Respiratory:  Negative for cough, shortness of breath and wheezing.   Cardiovascular:  Negative for chest pain, palpitations and leg swelling.  Gastrointestinal:  Positive for abdominal pain. Negative for blood in stool (no black stool), constipation and diarrhea.       Gerd controlled  Genitourinary:  Negative for dysuria.  Musculoskeletal:  Positive for arthralgias (finger on left hand) and back pain (intermittent).  Skin:  Negative for rash.  Neurological:  Negative for light-headedness and headaches (migraines).  Psychiatric/Behavioral:  Positive for dysphoric mood. The patient is not nervous/anxious.        Objective:   Vitals:   08/05/23 1031  BP: 138/76  Pulse: 77  Temp: 98.4 F (36.9 C)  SpO2: 98%   Filed Weights   08/05/23 1031  Weight: 139 lb (63 kg)   Body mass index is 23.86 kg/m.  BP Readings from Last 3 Encounters:  08/05/23 138/76  07/16/23 (!) 190/84  03/17/23 132/84    Wt Readings from Last 3 Encounters:  08/05/23 139 lb (63 kg)  07/16/23 139 lb 15.9 oz (63.5 kg)  06/16/23 140 lb (63.5 kg)  Physical Exam Constitutional: She appears well-developed and well-nourished. No distress.  HENT:  Head: Normocephalic and atraumatic.  Right Ear: External ear normal. Normal ear canal and TM Left Ear: External ear normal.  Normal ear canal and TM Mouth/Throat: Oropharynx is clear and moist.  Eyes: Conjunctivae normal.  Neck: Neck supple. No tracheal deviation present. No thyromegaly present.  No carotid bruit  Cardiovascular: Normal rate, regular rhythm and normal heart sounds.   No murmur heard.  No edema. Pulmonary/Chest: Effort normal and breath sounds normal. No respiratory distress. She has no wheezes. She has no rales.  Breast: deferred   Abdominal: Soft. She exhibits no distension. There is  tenderness that is mild in the epigastric region on the left side.  No rebound or guarding.  Lymphadenopathy: She has no cervical adenopathy.  Skin: Skin is warm and dry. She is not diaphoretic.  Psychiatric: She has a normal mood and affect. Her behavior is normal.     Lab Results  Component Value Date   WBC 6.4 07/22/2023   HGB 13.7 07/22/2023   HCT 41.9 07/22/2023   PLT 146.0 (L) 07/22/2023   GLUCOSE 97 07/22/2023   CHOL 209 (H) 07/22/2023   TRIG 179.0 (H) 07/22/2023   HDL 45.30 07/22/2023   LDLDIRECT 168.0 04/13/2021   LDLCALC 128 (H) 07/22/2023   ALT 15 07/22/2023   AST 19 07/22/2023   NA 137 07/22/2023   K 3.9 07/22/2023   CL 102 07/22/2023   CREATININE 0.83 07/22/2023   BUN 18 07/22/2023   CO2 27 07/22/2023   TSH 3.04 07/22/2023   HGBA1C 5.8 07/22/2023         Assessment & Plan:   Physical exam: Screening blood work  reviewed Exercise  walking Weight  normal Substance abuse  none   Reviewed recommended immunizations.   Health Maintenance  Topic Date Due   COVID-19 Vaccine (3 - Moderna risk series) 08/21/2023 (Originally 08/22/2020)   Zoster Vaccines- Shingrix (1 of 2) 11/04/2023 (Originally 06/11/1975)   INFLUENZA VACCINE  02/16/2024 (Originally 06/19/2023)   Pneumonia Vaccine 48+ Years old (1 of 1 - PCV) 08/04/2024 (Originally 06/10/2021)   Medicare Annual Wellness (AWV)  07/14/2024   MAMMOGRAM  12/26/2024   DEXA SCAN  07/14/2025   DTaP/Tdap/Td (5 - Td or Tdap) 02/11/2027   Colonoscopy  03/30/2028   Hepatitis C Screening  Completed   HPV VACCINES  Aged Out          See Problem List for Assessment and Plan of chronic medical problems.

## 2023-08-05 ENCOUNTER — Encounter: Payer: Self-pay | Admitting: Internal Medicine

## 2023-08-05 ENCOUNTER — Ambulatory Visit (INDEPENDENT_AMBULATORY_CARE_PROVIDER_SITE_OTHER): Payer: Medicare HMO | Admitting: Internal Medicine

## 2023-08-05 VITALS — BP 138/76 | HR 77 | Temp 98.4°F | Ht 64.0 in | Wt 139.0 lb

## 2023-08-05 DIAGNOSIS — Z0000 Encounter for general adult medical examination without abnormal findings: Secondary | ICD-10-CM | POA: Diagnosis not present

## 2023-08-05 DIAGNOSIS — M8588 Other specified disorders of bone density and structure, other site: Secondary | ICD-10-CM

## 2023-08-05 DIAGNOSIS — R7303 Prediabetes: Secondary | ICD-10-CM | POA: Diagnosis not present

## 2023-08-05 DIAGNOSIS — F3289 Other specified depressive episodes: Secondary | ICD-10-CM | POA: Diagnosis not present

## 2023-08-05 DIAGNOSIS — K219 Gastro-esophageal reflux disease without esophagitis: Secondary | ICD-10-CM

## 2023-08-05 DIAGNOSIS — G43809 Other migraine, not intractable, without status migrainosus: Secondary | ICD-10-CM | POA: Diagnosis not present

## 2023-08-05 DIAGNOSIS — E038 Other specified hypothyroidism: Secondary | ICD-10-CM | POA: Diagnosis not present

## 2023-08-05 DIAGNOSIS — I3139 Other pericardial effusion (noninflammatory): Secondary | ICD-10-CM

## 2023-08-05 DIAGNOSIS — E7849 Other hyperlipidemia: Secondary | ICD-10-CM

## 2023-08-05 DIAGNOSIS — G4733 Obstructive sleep apnea (adult) (pediatric): Secondary | ICD-10-CM | POA: Diagnosis not present

## 2023-08-05 MED ORDER — SUCRALFATE 1 G PO TABS: 1.0000 g | ORAL_TABLET | Freq: Three times a day (TID) | ORAL | 0 refills | Status: DC

## 2023-08-05 MED ORDER — OMEPRAZOLE 40 MG PO CPDR: 40.0000 mg | DELAYED_RELEASE_CAPSULE | Freq: Every day | ORAL | 3 refills | Status: AC

## 2023-08-05 NOTE — Assessment & Plan Note (Addendum)
Chronic Getting migraines 3-4 times a month Following at the headache center Getting Botox injections Takes ibuprofen as needed first-try to keep to a minimum given possible gastritis Takes Imitrex as needed

## 2023-08-05 NOTE — Assessment & Plan Note (Signed)
Chronic Controlled, Stable Continue paroxetine 20 mg daily

## 2023-08-05 NOTE — Assessment & Plan Note (Addendum)
Chronic GERD Controlled, but having symptoms of possible gastritis Recent episode of upper GI pain, nausea, vomiting diarrhea-evaluated in ED and concern for gastritis-CT abdomen and pelvis negative Continue omeprazole 40 mg daily Has not started the Carafate-resent to the pharmacy advised her to start this Keep NSAIDs to a minimum Bland diet If no improvement needs to see GI, but expected to improve with the above medications

## 2023-08-05 NOTE — Assessment & Plan Note (Signed)
Chronic Uses cpap nightly

## 2023-08-05 NOTE — Assessment & Plan Note (Signed)
Chronic  Clinically euthyroid TSH in normal range Continue taking levothyroxine 50 mcg daily

## 2023-08-05 NOTE — Assessment & Plan Note (Addendum)
Chronic DEXA up-to-date-stable Reviewed last DEXA Continue calcium and vitamin D daily Vitamin D level is good Continue regular exercise-currently walking.  Encouraged adding some weights

## 2023-08-05 NOTE — Assessment & Plan Note (Addendum)
Chronic Lab Results  Component Value Date   LDLCALC 128 (H) 07/22/2023  LDL improved CAC score 18 on 07/2022 Healthy diet, regular exercise Prefers to avoid medication

## 2023-08-05 NOTE — Assessment & Plan Note (Signed)
Chronic Lab Results  Component Value Date   HGBA1C 5.8 07/22/2023    Low sugar / carb diet Stressed regular exercise

## 2023-08-10 ENCOUNTER — Encounter: Payer: Self-pay | Admitting: Internal Medicine

## 2023-08-13 DIAGNOSIS — M542 Cervicalgia: Secondary | ICD-10-CM | POA: Diagnosis not present

## 2023-08-13 DIAGNOSIS — G518 Other disorders of facial nerve: Secondary | ICD-10-CM | POA: Diagnosis not present

## 2023-08-13 DIAGNOSIS — G43719 Chronic migraine without aura, intractable, without status migrainosus: Secondary | ICD-10-CM | POA: Diagnosis not present

## 2023-08-13 DIAGNOSIS — M791 Myalgia, unspecified site: Secondary | ICD-10-CM | POA: Diagnosis not present

## 2023-08-21 ENCOUNTER — Other Ambulatory Visit: Payer: Self-pay | Admitting: Internal Medicine

## 2023-08-25 ENCOUNTER — Encounter: Payer: Self-pay | Admitting: Internal Medicine

## 2023-08-25 DIAGNOSIS — R1010 Upper abdominal pain, unspecified: Secondary | ICD-10-CM

## 2023-09-04 ENCOUNTER — Other Ambulatory Visit (HOSPITAL_BASED_OUTPATIENT_CLINIC_OR_DEPARTMENT_OTHER): Payer: Medicare HMO

## 2023-09-16 ENCOUNTER — Other Ambulatory Visit: Payer: Self-pay

## 2023-09-16 ENCOUNTER — Other Ambulatory Visit: Payer: Self-pay | Admitting: Internal Medicine

## 2023-09-16 ENCOUNTER — Encounter: Payer: Self-pay | Admitting: Internal Medicine

## 2023-09-16 MED ORDER — SUMATRIPTAN SUCCINATE 100 MG PO TABS: 100.0000 mg | ORAL_TABLET | ORAL | 8 refills | Status: AC | PRN

## 2023-09-17 ENCOUNTER — Ambulatory Visit (HOSPITAL_BASED_OUTPATIENT_CLINIC_OR_DEPARTMENT_OTHER): Payer: Medicare HMO

## 2023-09-17 DIAGNOSIS — I3139 Other pericardial effusion (noninflammatory): Secondary | ICD-10-CM

## 2023-09-17 LAB — ECHOCARDIOGRAM COMPLETE
Area-P 1/2: 4.21 cm2
S' Lateral: 1.9 cm

## 2023-09-18 ENCOUNTER — Encounter: Payer: Self-pay | Admitting: Internal Medicine

## 2023-09-21 ENCOUNTER — Encounter: Payer: Self-pay | Admitting: Internal Medicine

## 2023-09-21 DIAGNOSIS — I517 Cardiomegaly: Secondary | ICD-10-CM | POA: Insufficient documentation

## 2023-10-01 DIAGNOSIS — G43719 Chronic migraine without aura, intractable, without status migrainosus: Secondary | ICD-10-CM | POA: Diagnosis not present

## 2023-10-01 DIAGNOSIS — M542 Cervicalgia: Secondary | ICD-10-CM | POA: Diagnosis not present

## 2023-10-01 DIAGNOSIS — G518 Other disorders of facial nerve: Secondary | ICD-10-CM | POA: Diagnosis not present

## 2023-10-01 DIAGNOSIS — M791 Myalgia, unspecified site: Secondary | ICD-10-CM | POA: Diagnosis not present

## 2023-10-02 ENCOUNTER — Encounter: Payer: Self-pay | Admitting: Internal Medicine

## 2023-10-02 NOTE — Progress Notes (Signed)
Subjective:    Patient ID: Tracie Hart, female    DOB: 1956-03-10, 67 y.o.   MRN: [id number]      HPI Yentl is here for  Chief Complaint  Patient presents with   Bruise    Bruise on left inner thigh    Still having stomach symptoms - has GI appt soon.    Gets dizzy when she gets up at night. Started two weeks.  The past two nights she has not worn her cpap.  She is hitting the walls when she walks.  She feels very clumsy - not walking straight.  She plans on increasing her water intake.  She also plans on getting to the gym and working on balance which she knows is not as good.  She states if this does not improve she will let me know   Bruise and knot on inner thigh -first noticed it nine days ago - denies any known trauma.   The left mid medial thigh has a large bruise and there is a knot there which she was concerned about  BP at neuro 128/80  Medications and allergies reviewed with patient and updated if appropriate.  Current Outpatient Medications on File Prior to Visit  Medication Sig Dispense Refill   BOTOX 100 units SOLR injection      calcium citrate (CALCITRATE - DOSED IN MG ELEMENTAL CALCIUM) 950 (200 Ca) MG tablet Take by mouth.     ibuprofen (ADVIL) 600 MG tablet Take by mouth.     levothyroxine (SYNTHROID) 50 MCG tablet TAKE 1 TABLET(50 MCG) BY MOUTH DAILY BEFORE BREAKFAST 90 tablet 3   omeprazole (PRILOSEC) 20 MG capsule TAKE 1 CAPSULE(20 MG) BY MOUTH DAILY 30 capsule 3   omeprazole (PRILOSEC) 40 MG capsule Take 1 capsule (40 mg total) by mouth daily. 30 capsule 3   ondansetron (ZOFRAN-ODT) 4 MG disintegrating tablet Take 1 tablet (4 mg total) by mouth every 8 (eight) hours as needed for nausea or vomiting. 20 tablet 0   PARoxetine (PAXIL) 20 MG tablet TAKE 1 TABLET BY MOUTH DAILY 90 tablet 3   SUMAtriptan (IMITREX) 100 MG tablet Take 1 tablet (100 mg total) by mouth every 2 (two) hours as needed for migraine. May repeat in 2 hours if headache  persists or recurs. 10 tablet 8   vitamin B-12 (CYANOCOBALAMIN) 500 MCG tablet Take 1,000 mcg by mouth daily.      No current facility-administered medications on file prior to visit.    Review of Systems     Objective:   Vitals:   10/03/23 1002  BP: 130/88  Pulse: 82  Temp: 98.2 F (36.8 C)  SpO2: 97%   BP Readings from Last 3 Encounters:  10/03/23 130/88  08/05/23 138/76  07/16/23 (!) 190/84   Wt Readings from Last 3 Encounters:  10/03/23 133 lb (60.3 kg)  08/05/23 139 lb (63 kg)  07/16/23 139 lb 15.9 oz (63.5 kg)   Body mass index is 22.83 kg/m.    Physical Exam Constitutional:      General: She is not in acute distress.    Appearance: Normal appearance. She is not ill-appearing.  HENT:     Head: Normocephalic and atraumatic.  Skin:    General: Skin is warm and dry.     Findings: Bruising (Large bruise left medial mid thigh that is minimally tender, the upper portion of the bruise there is a palpable lump that is minimally tender-lump is not on mobile) present.  Neurological:  Mental Status: She is alert.            Assessment & Plan:    See Problem List for Assessment and Plan of chronic medical problems.

## 2023-10-03 ENCOUNTER — Ambulatory Visit (INDEPENDENT_AMBULATORY_CARE_PROVIDER_SITE_OTHER): Payer: Medicare HMO | Admitting: Internal Medicine

## 2023-10-03 VITALS — BP 130/88 | HR 82 | Temp 98.2°F | Ht 64.0 in | Wt 133.0 lb

## 2023-10-03 DIAGNOSIS — R58 Hemorrhage, not elsewhere classified: Secondary | ICD-10-CM | POA: Diagnosis not present

## 2023-10-03 DIAGNOSIS — R030 Elevated blood-pressure reading, without diagnosis of hypertension: Secondary | ICD-10-CM | POA: Diagnosis not present

## 2023-10-03 DIAGNOSIS — R2242 Localized swelling, mass and lump, left lower limb: Secondary | ICD-10-CM | POA: Diagnosis not present

## 2023-10-03 NOTE — Patient Instructions (Signed)
    Monitor the leg and let me know if we need to do an ultrasound.

## 2023-10-03 NOTE — Assessment & Plan Note (Signed)
Chronic Diastolic blood pressure borderline elevated Recent BP was 120/80 at neurology She will continue to monitor Not currently on any medication

## 2023-10-03 NOTE — Assessment & Plan Note (Signed)
Acute Noticed 9 days ago left mid thigh mildy tender ecchymosis with lump Denies any trauma Likely benign in nature Discussed that she may have had some mild trauma that she was not aware of and that caused a small hematoma and ecchymosis Less likely lipoma She will monitor for a couple of weeks and if there is not complete resolution she will let me know we can order an ultrasound for further evaluation

## 2023-10-03 NOTE — Assessment & Plan Note (Signed)
Acute Left mid thigh with associated ecchymosis Likely related to mild trauma but she does not recall this Hopefully will resolve within the next couple of weeks or at least improve Lump is likely a hematoma If lump does not resolve can consider an ultrasound to evaluate further

## 2023-10-06 ENCOUNTER — Ambulatory Visit: Payer: Medicare HMO | Admitting: Internal Medicine

## 2023-10-24 ENCOUNTER — Ambulatory Visit: Payer: Medicare HMO | Admitting: Gastroenterology

## 2023-10-28 ENCOUNTER — Ambulatory Visit: Payer: Medicare HMO | Admitting: Psychiatry

## 2023-11-15 ENCOUNTER — Encounter: Payer: Self-pay | Admitting: Internal Medicine

## 2023-11-17 ENCOUNTER — Other Ambulatory Visit: Payer: Self-pay

## 2023-11-17 MED ORDER — OMEPRAZOLE 20 MG PO CPDR: 20.0000 mg | DELAYED_RELEASE_CAPSULE | Freq: Every day | ORAL | 2 refills | Status: DC

## 2023-11-24 DIAGNOSIS — Z0100 Encounter for examination of eyes and vision without abnormal findings: Secondary | ICD-10-CM | POA: Diagnosis not present

## 2023-11-25 ENCOUNTER — Encounter: Payer: Self-pay | Admitting: Internal Medicine

## 2023-11-25 DIAGNOSIS — N644 Mastodynia: Secondary | ICD-10-CM

## 2023-12-29 ENCOUNTER — Other Ambulatory Visit: Payer: Medicare HMO

## 2024-01-14 ENCOUNTER — Ambulatory Visit
Admission: RE | Admit: 2024-01-14 | Discharge: 2024-01-14 | Disposition: A | Discharge status: Home / Self Care | Payer: Medicare HMO | Source: Ambulatory Visit | Attending: Internal Medicine | Admitting: Internal Medicine

## 2024-01-14 DIAGNOSIS — N644 Mastodynia: Secondary | ICD-10-CM

## 2024-03-03 ENCOUNTER — Encounter: Payer: Self-pay | Admitting: Internal Medicine

## 2024-03-30 ENCOUNTER — Ambulatory Visit: Admitting: Internal Medicine

## 2024-04-01 ENCOUNTER — Ambulatory Visit: Admitting: Internal Medicine

## 2024-04-15 ENCOUNTER — Ambulatory Visit: Admitting: Podiatry

## 2024-04-15 ENCOUNTER — Encounter: Payer: Self-pay | Admitting: Podiatry

## 2024-04-15 VITALS — Ht 64.0 in | Wt 133.0 lb

## 2024-04-15 DIAGNOSIS — M779 Enthesopathy, unspecified: Secondary | ICD-10-CM

## 2024-04-15 MED ORDER — TRIAMCINOLONE ACETONIDE 10 MG/ML IJ SUSP
10.0000 mg | Freq: Once | INTRAMUSCULAR | Status: AC
Start: 2024-04-15 — End: 2024-04-15
  Administered 2024-04-15: 10 mg via INTRA_ARTICULAR

## 2024-04-16 NOTE — Progress Notes (Signed)
 Subjective:   Patient ID: Tracie Hart, female   DOB: 68 y.o.   MRN: [id number]   HPI Patient states she started develop pain in the last 3 to 4 weeks on top of the right foot again and stated that it did well for an extended period of time   ROS      Objective:  Physical Exam  Neurovascular status intact with exquisite discomfort dorsal right in the area of the extensor tendon complex group midfoot.     Assessment:  Acute extensor tendinitis right painful     Plan:  H&P reviewed went ahead today did sterile prep injected the extensor tendon group 3 mg dexamethasone  Kenalog  5 mg Xylocaine  advised on using topical gel and shoes that are not too tight and reappoint as symptoms indicate

## 2024-05-09 NOTE — Progress Notes (Unsigned)
    Subjective:    Patient ID: Tracie Hart, female    DOB: [birth year], 68 y.o.   MRN: [id number]      HPI Tracie Hart is here for No chief complaint on file.    Fatigue , depressed -    Change paxil  - cele plus well, recck tsh   Medications and allergies reviewed with patient and updated if appropriate.  Current Outpatient Medications on File Prior to Visit  Medication Sig Dispense Refill   BOTOX 100 units SOLR injection      calcium citrate (CALCITRATE - DOSED IN MG ELEMENTAL CALCIUM) 950 (200 Ca) MG tablet Take by mouth.     ibuprofen (ADVIL) 600 MG tablet Take by mouth.     levothyroxine  (SYNTHROID ) 50 MCG tablet TAKE 1 TABLET(50 MCG) BY MOUTH DAILY BEFORE BREAKFAST 90 tablet 3   omeprazole  (PRILOSEC) 20 MG capsule Take 1 capsule (20 mg total) by mouth daily. 90 capsule 2   omeprazole  (PRILOSEC) 40 MG capsule Take 1 capsule (40 mg total) by mouth daily. 30 capsule 3   ondansetron  (ZOFRAN -ODT) 4 MG disintegrating tablet Take 1 tablet (4 mg total) by mouth every 8 (eight) hours as needed for nausea or vomiting. 20 tablet 0   PARoxetine  (PAXIL ) 20 MG tablet TAKE 1 TABLET BY MOUTH DAILY 90 tablet 3   SUMAtriptan  (IMITREX ) 100 MG tablet Take 1 tablet (100 mg total) by mouth every 2 (two) hours as needed for migraine. May repeat in 2 hours if headache persists or recurs. 10 tablet 8   vitamin B-12 (CYANOCOBALAMIN) 500 MCG tablet Take 1,000 mcg by mouth daily.      No current facility-administered medications on file prior to visit.    Review of Systems     Objective:  There were no vitals filed for this visit. BP Readings from Last 3 Encounters:  10/03/23 130/88  08/05/23 138/76  07/16/23 (!) 190/84   Wt Readings from Last 3 Encounters:  04/15/24 133 lb (60.3 kg)  10/03/23 133 lb (60.3 kg)  08/05/23 139 lb (63 kg)   There is no height or weight on file to calculate BMI.    Physical Exam         Assessment & Plan:    See Problem List for Assessment and  Plan of chronic medical problems.

## 2024-05-10 ENCOUNTER — Ambulatory Visit (INDEPENDENT_AMBULATORY_CARE_PROVIDER_SITE_OTHER): Admitting: Internal Medicine

## 2024-05-10 ENCOUNTER — Encounter: Payer: Self-pay | Admitting: Internal Medicine

## 2024-05-10 VITALS — BP 150/100 | HR 79 | Temp 98.2°F | Ht 64.0 in | Wt 139.0 lb

## 2024-05-10 DIAGNOSIS — G479 Sleep disorder, unspecified: Secondary | ICD-10-CM

## 2024-05-10 DIAGNOSIS — F3289 Other specified depressive episodes: Secondary | ICD-10-CM

## 2024-05-10 DIAGNOSIS — E038 Other specified hypothyroidism: Secondary | ICD-10-CM | POA: Diagnosis not present

## 2024-05-10 DIAGNOSIS — R197 Diarrhea, unspecified: Secondary | ICD-10-CM | POA: Insufficient documentation

## 2024-05-10 DIAGNOSIS — R030 Elevated blood-pressure reading, without diagnosis of hypertension: Secondary | ICD-10-CM | POA: Diagnosis not present

## 2024-05-10 DIAGNOSIS — R5383 Other fatigue: Secondary | ICD-10-CM

## 2024-05-10 DIAGNOSIS — G43909 Migraine, unspecified, not intractable, without status migrainosus: Secondary | ICD-10-CM | POA: Diagnosis not present

## 2024-05-10 DIAGNOSIS — R7303 Prediabetes: Secondary | ICD-10-CM | POA: Diagnosis not present

## 2024-05-10 LAB — CBC WITH DIFFERENTIAL/PLATELET
Basophils Absolute: 0 10*3/uL (ref 0.0–0.1)
Basophils Relative: 0.4 % (ref 0.0–3.0)
Eosinophils Absolute: 0.1 10*3/uL (ref 0.0–0.7)
Eosinophils Relative: 1.4 % (ref 0.0–5.0)
HCT: 39.4 % (ref 36.0–46.0)
Hemoglobin: 13.2 g/dL (ref 12.0–15.0)
Lymphocytes Relative: 26.3 % (ref 12.0–46.0)
Lymphs Abs: 1.6 10*3/uL (ref 0.7–4.0)
MCHC: 33.6 g/dL (ref 30.0–36.0)
MCV: 83.4 fl (ref 78.0–100.0)
Monocytes Absolute: 0.4 10*3/uL (ref 0.1–1.0)
Monocytes Relative: 6.7 % (ref 3.0–12.0)
Neutro Abs: 3.9 10*3/uL (ref 1.4–7.7)
Neutrophils Relative %: 65.2 % (ref 43.0–77.0)
Platelets: 116 10*3/uL — ABNORMAL LOW (ref 150.0–400.0)
RBC: 4.72 Mil/uL (ref 3.87–5.11)
RDW: 14.1 % (ref 11.5–15.5)
WBC: 6 10*3/uL (ref 4.0–10.5)

## 2024-05-10 LAB — TSH: TSH: 3.3 u[IU]/mL (ref 0.35–5.50)

## 2024-05-10 LAB — HEMOGLOBIN A1C: Hgb A1c MFr Bld: 5.8 % (ref 4.6–6.5)

## 2024-05-10 MED ORDER — TRAZODONE HCL 50 MG PO TABS: 50.0000 mg | ORAL_TABLET | Freq: Every evening | ORAL | 3 refills | Status: DC | PRN

## 2024-05-10 NOTE — Assessment & Plan Note (Signed)
 Chronic Not able to pay for [facility] co-pay Taking medication as needed, but continues to have migraines.  She avoids known triggers, but not sure what else is causing them ?  Related to a food allergy-referral to allergy

## 2024-05-10 NOTE — Assessment & Plan Note (Addendum)
 Chronic Worse, especially last week-not controlled Currently on paxil  20 mg daily Starting to see a therapist this week Discussed switching her to a different medication-ideally we do not want to keep her on 40 mg of Paxil  Also improving sleep may help depression overall For now continue Paxil  20 mg daily

## 2024-05-10 NOTE — Assessment & Plan Note (Signed)
Chronic Lab Results  Component Value Date   HGBA1C 5.8 07/22/2023    Low sugar / carb diet Stressed regular exercise

## 2024-05-10 NOTE — Assessment & Plan Note (Signed)
 Chronic Episodes of nausea/vomiting/diarrhea Has increased in frequency No obvious cause ?  Related to something she is ingesting/allergy Referral to allergy

## 2024-05-10 NOTE — Assessment & Plan Note (Signed)
 Acute States fatigue-worse in the past few weeks Poor sleep is likely contributing ?  Anything else contributing Check CBC, TSH, CMP Continue regular exercise

## 2024-05-10 NOTE — Assessment & Plan Note (Addendum)
 Chronic Difficulty falling and staying asleep Has tried several otc meds and prescriptions  - make her HA's worse Trial of trazodone 50 mg daily-can increase If not effective can consider Sonata, but this could cause headaches

## 2024-05-10 NOTE — Assessment & Plan Note (Signed)
 Subacute Blood pressure has been elevated on several occasions She knows she is probably in denial and needs to be on medication but would not to take  Stressed that she needs to be monitoring her blood pressure at home regularly know for sure if this is whitecoat hypertension or her blood pressure is elevated persistently Discussed possible side effects of uncontrolled BP

## 2024-05-10 NOTE — Patient Instructions (Addendum)
     Have blood work done.    Medications changes include :   start trazodone 50 mg nightly.  Stay on paxil  20 mg daily.     Referral to allergy.     Return if symptoms worsen or fail to improve.

## 2024-05-10 NOTE — Assessment & Plan Note (Signed)
 Chronic  Clinically euthyroid, except for fatigue TSH in normal range Continue taking levothyroxine  50 mcg daily

## 2024-05-11 ENCOUNTER — Encounter: Payer: Self-pay | Admitting: Internal Medicine

## 2024-05-11 LAB — COMPREHENSIVE METABOLIC PANEL WITH GFR
ALT: 18 U/L (ref 0–35)
AST: 21 U/L (ref 0–37)
Albumin: 4.4 g/dL (ref 3.5–5.2)
Alkaline Phosphatase: 52 U/L (ref 39–117)
BUN: 21 mg/dL (ref 6–23)
CO2: 28 meq/L (ref 19–32)
Calcium: 9.5 mg/dL (ref 8.4–10.5)
Chloride: 102 meq/L (ref 96–112)
Creatinine, Ser: 0.8 mg/dL (ref 0.40–1.20)
GFR: 75.98 mL/min (ref >60.00)
Glucose, Bld: 96 mg/dL (ref 70–99)
Potassium: 4.2 meq/L (ref 3.5–5.1)
Sodium: 140 meq/L (ref 135–145)
Total Bilirubin: 0.3 mg/dL (ref 0.2–1.2)
Total Protein: 6.7 g/dL (ref 6.0–8.3)

## 2024-05-12 ENCOUNTER — Ambulatory Visit: Payer: Self-pay | Admitting: Internal Medicine

## 2024-05-12 ENCOUNTER — Ambulatory Visit: Admitting: Licensed Clinical Social Worker

## 2024-06-07 ENCOUNTER — Encounter: Payer: Self-pay | Admitting: Internal Medicine

## 2024-06-16 ENCOUNTER — Ambulatory Visit

## 2024-06-28 ENCOUNTER — Other Ambulatory Visit: Payer: Self-pay | Admitting: Internal Medicine

## 2024-07-06 ENCOUNTER — Encounter: Payer: Self-pay | Admitting: Internal Medicine

## 2024-07-08 DIAGNOSIS — N398 Other specified disorders of urinary system: Secondary | ICD-10-CM | POA: Diagnosis not present

## 2024-07-09 NOTE — Telephone Encounter (Signed)
**Note De-identified  [place] Obfuscation** Please advise 

## 2024-07-12 ENCOUNTER — Other Ambulatory Visit: Payer: Self-pay

## 2024-07-12 DIAGNOSIS — R7303 Prediabetes: Secondary | ICD-10-CM

## 2024-07-12 MED ORDER — GLUCOSE BLOOD VI STRP: ORAL_STRIP | 12 refills | Status: DC

## 2024-07-12 MED ORDER — ACCU-CHEK SOFTCLIX LANCETS MISC: 12 refills | Status: DC

## 2024-07-13 DIAGNOSIS — M5459 Other low back pain: Secondary | ICD-10-CM | POA: Diagnosis not present

## 2024-07-14 DIAGNOSIS — S61412A Laceration without foreign body of left hand, initial encounter: Secondary | ICD-10-CM | POA: Diagnosis not present

## 2024-07-14 DIAGNOSIS — Z23 Encounter for immunization: Secondary | ICD-10-CM | POA: Diagnosis not present

## 2024-07-14 DIAGNOSIS — W260XXA Contact with knife, initial encounter: Secondary | ICD-10-CM | POA: Diagnosis not present

## 2024-07-22 DIAGNOSIS — Z4802 Encounter for removal of sutures: Secondary | ICD-10-CM | POA: Diagnosis not present

## 2024-07-22 DIAGNOSIS — S61412D Laceration without foreign body of left hand, subsequent encounter: Secondary | ICD-10-CM | POA: Diagnosis not present

## 2024-07-29 ENCOUNTER — Encounter: Payer: Self-pay | Admitting: Internal Medicine

## 2024-07-30 DIAGNOSIS — M545 Low back pain, unspecified: Secondary | ICD-10-CM | POA: Diagnosis not present

## 2024-08-01 DIAGNOSIS — M5459 Other low back pain: Secondary | ICD-10-CM | POA: Diagnosis not present

## 2024-08-04 ENCOUNTER — Other Ambulatory Visit: Payer: Self-pay

## 2024-08-04 DIAGNOSIS — R7303 Prediabetes: Secondary | ICD-10-CM

## 2024-08-04 DIAGNOSIS — M545 Low back pain, unspecified: Secondary | ICD-10-CM | POA: Diagnosis not present

## 2024-08-05 ENCOUNTER — Other Ambulatory Visit: Payer: Self-pay

## 2024-08-05 DIAGNOSIS — R7303 Prediabetes: Secondary | ICD-10-CM

## 2024-08-05 MED ORDER — GLUCOSE BLOOD VI STRP
ORAL_STRIP | 12 refills | Status: AC
Start: 2024-08-05 — End: ?

## 2024-08-05 MED ORDER — ACCU-CHEK SOFTCLIX LANCETS MISC
12 refills | Status: AC
Start: 2024-08-05 — End: ?

## 2024-08-10 DIAGNOSIS — M5416 Radiculopathy, lumbar region: Secondary | ICD-10-CM | POA: Diagnosis not present

## 2024-08-17 DIAGNOSIS — H524 Presbyopia: Secondary | ICD-10-CM | POA: Diagnosis not present

## 2024-08-27 DIAGNOSIS — M545 Low back pain, unspecified: Secondary | ICD-10-CM | POA: Diagnosis not present

## 2024-09-03 DIAGNOSIS — M545 Low back pain, unspecified: Secondary | ICD-10-CM | POA: Diagnosis not present

## 2024-09-09 DIAGNOSIS — U071 COVID-19: Secondary | ICD-10-CM | POA: Diagnosis not present

## 2024-09-09 DIAGNOSIS — R051 Acute cough: Secondary | ICD-10-CM | POA: Diagnosis not present

## 2024-09-10 ENCOUNTER — Other Ambulatory Visit: Payer: Self-pay | Admitting: Internal Medicine

## 2024-09-12 ENCOUNTER — Encounter (HOSPITAL_BASED_OUTPATIENT_CLINIC_OR_DEPARTMENT_OTHER): Payer: Self-pay

## 2024-09-12 ENCOUNTER — Other Ambulatory Visit: Payer: Self-pay

## 2024-09-12 ENCOUNTER — Emergency Department (HOSPITAL_BASED_OUTPATIENT_CLINIC_OR_DEPARTMENT_OTHER)
Admission: EM | Admit: 2024-09-12 | Discharge: 2024-09-12 | Disposition: A | Discharge status: Home / Self Care | Attending: Emergency Medicine | Admitting: Emergency Medicine

## 2024-09-12 DIAGNOSIS — R059 Cough, unspecified: Secondary | ICD-10-CM | POA: Diagnosis present

## 2024-09-12 DIAGNOSIS — U071 COVID-19: Secondary | ICD-10-CM | POA: Diagnosis not present

## 2024-09-12 NOTE — ED Triage Notes (Signed)
 Pt reports taking home test of COVID and reporting positive x4 days ago. Pt reports being on Augmentin and Prednisone. Pt reports only being on Prednisone x2 days.

## 2024-09-12 NOTE — Discharge Instructions (Signed)
 Drink plenty of fluids and get plenty of rest.  Continue medications as previously prescribed.  Take over-the-counter medications as needed for relief of symptoms.

## 2024-09-12 NOTE — ED Provider Notes (Signed)
 [place] EMERGENCY DEPARTMENT AT [facility] Provider Note   CSN: [id number] Arrival date & time: 09/12/24  1023     Patient presents with: Cough and Covid Positive   Tracie Hart is a 68 y.o. female.   Patient is a 68 year old female with past medical history of anemia, GERD, migraines, depression.  Patient presenting today with complaints of cough, congestion, and feeling generally unwell she.  She tested positive with a home COVID test 1 week ago.  She was seen at urgent care several days ago and prescribed prednisone and Augmentin, but symptoms persist.       Prior to Admission medications   Medication Sig Start Date End Date Taking? Authorizing Provider  Accu-Chek Softclix Lancets lancets Use as instructed to check glucose 1-2 times daily 08/05/24   Geofm Glade PARAS, MD  BOTOX 100 units SOLR injection  07/31/22   [provider]  calcium citrate (CALCITRATE - DOSED IN MG ELEMENTAL CALCIUM) 950 (200 Ca) MG tablet Take by mouth. 06/14/20   [provider]  glucose blood test strip Use as instructed to check glucose 1-2 times daily 08/05/24   Geofm Glade PARAS, MD  ibuprofen (ADVIL) 600 MG tablet Take by mouth.    [provider]  levothyroxine  (SYNTHROID ) 50 MCG tablet TAKE 1 TABLET(50 MCG) BY MOUTH DAILY BEFORE BREAKFAST 06/28/24   Burns, Glade PARAS, MD  omeprazole  (PRILOSEC) 20 MG capsule TAKE 1 CAPSULE(20 MG) BY MOUTH DAILY 09/10/24   Geofm Glade PARAS, MD  omeprazole  (PRILOSEC) 40 MG capsule Take 1 capsule (40 mg total) by mouth daily. 08/05/23   Geofm Glade PARAS, MD  ondansetron  (ZOFRAN -ODT) 4 MG disintegrating tablet Take 1 tablet (4 mg total) by mouth every 8 (eight) hours as needed for nausea or vomiting. 07/16/23   Tegeler, Lonni PARAS, MD  PARoxetine  (PAXIL ) 20 MG tablet TAKE 1 TABLET BY MOUTH DAILY 08/21/23   Geofm Glade PARAS, MD  SUMAtriptan  (IMITREX ) 100 MG tablet Take 1 tablet (100 mg total) by mouth every 2 (two) hours as needed for  migraine. May repeat in 2 hours if headache persists or recurs. 09/16/23   Geofm Glade PARAS, MD  traZODone  (DESYREL ) 50 MG tablet Take 1 tablet (50 mg total) by mouth at bedtime as needed for sleep. 05/10/24   Geofm Glade PARAS, MD  vitamin B-12 (CYANOCOBALAMIN) 500 MCG tablet Take 1,000 mcg by mouth daily.     [provider]    Allergies: Codeine    Review of Systems  All other systems reviewed and are negative.   Updated Vital Signs BP (!) 163/96 (BP Location: Right Arm)   Pulse 77   Temp 98.2 F (36.8 C) (Oral)   Resp 18   Ht 5' 4 (1.626 m)   Wt 58.5 kg   SpO2 100%   BMI 22.14 kg/m   Physical Exam Vitals and nursing note reviewed.  Constitutional:      General: She is not in acute distress.    Appearance: She is well-developed. She is not diaphoretic.  HENT:     Head: Normocephalic and atraumatic.  Cardiovascular:     Rate and Rhythm: Normal rate and regular rhythm.     Heart sounds: No murmur heard.    No friction rub. No gallop.  Pulmonary:     Effort: Pulmonary effort is normal. No respiratory distress.     Breath sounds: Normal breath sounds. No wheezing.  Abdominal:     General: Bowel sounds are normal. There is no  distension.     Palpations: Abdomen is soft.     Tenderness: There is no abdominal tenderness.  Musculoskeletal:        General: Normal range of motion.     Cervical back: Normal range of motion and neck supple.  Skin:    General: Skin is warm and dry.  Neurological:     General: No focal deficit present.     Mental Status: She is alert and oriented to person, place, and time.     (all labs ordered are listed, but only abnormal results are displayed) Labs Reviewed - No data to display  EKG: None  Radiology: No results found.   Procedures   Medications Ordered in the ED - No data to display                                  Medical Decision Making  Patient presenting here with complaints of URI symptoms and bodyaches as  described in the HPI.  She arrives here with stable vital signs and is afebrile.  There is no hypoxia.  Physical examination unremarkable.  Patient had a home COVID test that was +1-week ago, but symptoms persist.  She states that she feels as though she is not getting any better.  I did offer to obtain blood work and a chest x-ray, however patient declines this.  She is comfortable with waiting a couple more days to see what happens.  She will be discharged with continued use of previously prescribed medications and return as needed.     Final diagnoses:  None    ED Discharge Orders     None          Geroldine Berg, MD 09/12/24 1102

## 2024-09-22 ENCOUNTER — Encounter: Payer: Self-pay | Admitting: Internal Medicine

## 2024-09-22 ENCOUNTER — Inpatient Hospital Stay: Admitting: Internal Medicine

## 2024-09-27 ENCOUNTER — Encounter: Payer: Self-pay | Admitting: Internal Medicine

## 2024-09-27 NOTE — Progress Notes (Signed)
 "   Subjective:    Patient ID: Tracie Hart, female    DOB: 09-14-1956, 68 y.o.   MRN: [id number]      HPI Tracie Hart is here for  Chief Complaint  Patient presents with   Medical Management of Chronic Issues    Patient wants to discuss weight loss; She also has a spot on her right palm she wants to have looked at      Discussed the use of AI scribe software for clinical note transcription with the patient, who gave verbal consent to proceed.  History of Present Illness Tracie Hart is a 68 year old female who presents with persistent cough and fatigue following a COVID-19 infection.  Three weeks ago, she developed a dry cough and tested positive for COVID-19 using a home test. Her symptoms have fluctuated, and she continues to experience a persistent cough, which has now become productive. She experiences extreme fatigue, going to bed early at 7:30 PM, and visited the ER due to her friends' encouragement but has not fully recovered. No shortness of breath, wheezing, chest pain, palpitations, leg swelling, sinus pain, or abnormal bruising. She has been taking a steroid pack and Augmentin for her cough.  She reports unintentional weight loss from 139 lbs in September last year to as low as 127 lbs recently, currently fluctuating around 128 lbs. She is not intentionally trying to lose weight and is concerned about the unintentional weight loss. She has been monitoring her diet, reducing sugar intake, and continues to eat bread and other carbohydrates. She walks 3.5 miles daily and has started using a protein powder to increase her protein intake. Her appetite remains good, and she has been eating regular meals.  She experiences pain in her stomach, which she believes may not be related to her respiratory symptoms.   She also reports a new symptom of her right middle finger turning void of color, which she associates with pain, lasting less than a minute. This has been occurring  for the past week.  She has a painful lump on her knee, which has been present for a couple of years and can become very painful at times.  She has a history of migraines, which have improved, and she takes half a trazodone  to avoid headaches. She experiences dizziness and unsteadiness, particularly in the mornings, but no headaches or lightheadedness. She has been monitoring her blood sugar levels, which range from 84 to 105, and is curious about her A1c levels. She has reduced her caffeine intake to manage her blood pressure, which she has not been monitoring regularly.  Her sisters have severe arthritis, and she is starting to experience arthritic changes in her fingers.     Medications and allergies reviewed with patient and updated if appropriate.  Current Outpatient Medications on File Prior to Visit  Medication Sig Dispense Refill   Accu-Chek Softclix Lancets lancets Use as instructed to check glucose 1-2 times daily 100 each 12   BOTOX 100 units SOLR injection      calcium citrate (CALCITRATE - DOSED IN MG ELEMENTAL CALCIUM) 950 (200 Ca) MG tablet Take by mouth.     glucose blood test strip Use as instructed to check glucose 1-2 times daily 100 each 12   ibuprofen (ADVIL) 600 MG tablet Take by mouth.     levothyroxine  (SYNTHROID ) 50 MCG tablet TAKE 1 TABLET(50 MCG) BY MOUTH DAILY BEFORE BREAKFAST 90 tablet 3   omeprazole  (PRILOSEC) 20 MG capsule TAKE 1 CAPSULE(20 MG)  BY MOUTH DAILY 90 capsule 2   omeprazole  (PRILOSEC) 40 MG capsule Take 1 capsule (40 mg total) by mouth daily. 30 capsule 3   ondansetron  (ZOFRAN -ODT) 4 MG disintegrating tablet Take 1 tablet (4 mg total) by mouth every 8 (eight) hours as needed for nausea or vomiting. 20 tablet 0   PARoxetine  (PAXIL ) 20 MG tablet TAKE 1 TABLET BY MOUTH DAILY 90 tablet 3   SUMAtriptan  (IMITREX ) 100 MG tablet Take 1 tablet (100 mg total) by mouth every 2 (two) hours as needed for migraine. May repeat in 2 hours if headache persists or  recurs. 10 tablet 8   traZODone  (DESYREL ) 50 MG tablet Take 1 tablet (50 mg total) by mouth at bedtime as needed for sleep. 30 tablet 3   vitamin B-12 (CYANOCOBALAMIN) 500 MCG tablet Take 1,000 mcg by mouth daily.      No current facility-administered medications on file prior to visit.    Review of Systems  Constitutional:  Positive for fatigue. Negative for appetite change and fever.  HENT:  Negative for congestion and sinus pain.   Respiratory:  Positive for cough (improving - productive). Negative for shortness of breath and wheezing.   Cardiovascular:  Negative for chest pain, palpitations and leg swelling.  Neurological:  Positive for headaches (migraines improved). Negative for dizziness.       Feels unbalanced  Psychiatric/Behavioral:  Positive for sleep disturbance (not great - but not different from usual).        Objective:   Vitals:   09/28/24 1035  BP: (!) 140/72  Pulse: 80  Temp: 98.7 F (37.1 C)  SpO2: 97%   BP Readings from Last 3 Encounters:  09/28/24 (!) 140/72  09/12/24 (!) 163/96  05/10/24 (!) 150/100   Wt Readings from Last 3 Encounters:  09/28/24 129 lb (58.5 kg)  09/12/24 129 lb (58.5 kg)  05/10/24 139 lb (63 kg)   Body mass index is 22.14 kg/m.    Physical Exam Constitutional:      General: She is not in acute distress.    Appearance: Normal appearance. She is not ill-appearing.  HENT:     Head: Normocephalic and atraumatic.     Right Ear: Tympanic membrane, ear canal and external ear normal.     Left Ear: Tympanic membrane, ear canal and external ear normal.     Mouth/Throat:     Mouth: Mucous membranes are moist.     Pharynx: No oropharyngeal exudate or posterior oropharyngeal erythema.  Eyes:     Conjunctiva/sclera: Conjunctivae normal.  Cardiovascular:     Rate and Rhythm: Normal rate and regular rhythm.  Pulmonary:     Effort: Pulmonary effort is normal. No respiratory distress.     Breath sounds: Normal breath sounds. No  wheezing or rales.  Musculoskeletal:     Cervical back: Neck supple. No tenderness.     Right lower leg: No edema.     Left lower leg: No edema.  Lymphadenopathy:     Cervical: No cervical adenopathy.  Skin:    General: Skin is warm and dry.     Findings: No rash.  Neurological:     Mental Status: She is alert.            Assessment & Plan:    See Problem List for Assessment and Plan of chronic medical problems.      "

## 2024-09-28 ENCOUNTER — Other Ambulatory Visit: Payer: Self-pay | Admitting: Internal Medicine

## 2024-09-28 ENCOUNTER — Ambulatory Visit (INDEPENDENT_AMBULATORY_CARE_PROVIDER_SITE_OTHER): Admitting: Internal Medicine

## 2024-09-28 VITALS — BP 140/72 | HR 80 | Temp 98.7°F | Ht 64.0 in | Wt 129.0 lb

## 2024-09-28 DIAGNOSIS — R030 Elevated blood-pressure reading, without diagnosis of hypertension: Secondary | ICD-10-CM

## 2024-09-28 DIAGNOSIS — E7849 Other hyperlipidemia: Secondary | ICD-10-CM

## 2024-09-28 DIAGNOSIS — R7303 Prediabetes: Secondary | ICD-10-CM

## 2024-09-28 DIAGNOSIS — R229 Localized swelling, mass and lump, unspecified: Secondary | ICD-10-CM

## 2024-09-28 DIAGNOSIS — E038 Other specified hypothyroidism: Secondary | ICD-10-CM | POA: Diagnosis not present

## 2024-09-28 DIAGNOSIS — R5383 Other fatigue: Secondary | ICD-10-CM | POA: Diagnosis not present

## 2024-09-28 DIAGNOSIS — I7300 Raynaud's syndrome without gangrene: Secondary | ICD-10-CM

## 2024-09-28 LAB — CBC WITH DIFFERENTIAL/PLATELET
Basophils Absolute: 0 K/uL (ref 0.0–0.1)
Basophils Relative: 0.6 % (ref 0.0–3.0)
Eosinophils Absolute: 0.1 K/uL (ref 0.0–0.7)
Eosinophils Relative: 1.3 % (ref 0.0–5.0)
HCT: 41.3 % (ref 36.0–46.0)
Hemoglobin: 13.8 g/dL (ref 12.0–15.0)
Lymphocytes Relative: 22.7 % (ref 12.0–46.0)
Lymphs Abs: 1.1 K/uL (ref 0.7–4.0)
MCHC: 33.3 g/dL (ref 30.0–36.0)
MCV: 84.3 fl (ref 78.0–100.0)
Monocytes Absolute: 0.4 K/uL (ref 0.1–1.0)
Monocytes Relative: 7.6 % (ref 3.0–12.0)
Neutro Abs: 3.4 K/uL (ref 1.4–7.7)
Neutrophils Relative %: 67.8 % (ref 43.0–77.0)
Platelets: 122 K/uL — ABNORMAL LOW (ref 150.0–400.0)
RBC: 4.9 Mil/uL (ref 3.87–5.11)
RDW: 13.9 % (ref 11.5–15.5)
WBC: 5 K/uL (ref 4.0–10.5)

## 2024-09-28 LAB — COMPREHENSIVE METABOLIC PANEL WITH GFR
ALT: 17 U/L (ref 0–35)
AST: 21 U/L (ref 0–37)
Albumin: 4.4 g/dL (ref 3.5–5.2)
Alkaline Phosphatase: 49 U/L (ref 39–117)
BUN: 21 mg/dL (ref 6–23)
CO2: 30 meq/L (ref 19–32)
Calcium: 9.4 mg/dL (ref 8.4–10.5)
Chloride: 101 meq/L (ref 96–112)
Creatinine, Ser: 0.75 mg/dL (ref 0.40–1.20)
GFR: 81.87 mL/min (ref >60.00)
Glucose, Bld: 82 mg/dL (ref 70–99)
Potassium: 4 meq/L (ref 3.5–5.1)
Sodium: 138 meq/L (ref 135–145)
Total Bilirubin: 0.4 mg/dL (ref 0.2–1.2)
Total Protein: 6.8 g/dL (ref 6.0–8.3)

## 2024-09-28 LAB — LIPID PANEL
Cholesterol: 234 mg/dL — ABNORMAL HIGH (ref 0–200)
HDL: 49.2 mg/dL (ref >39.00)
LDL Cholesterol: 144 mg/dL — ABNORMAL HIGH (ref 0–99)
NonHDL: 184.58
Total CHOL/HDL Ratio: 5
Triglycerides: 205 mg/dL — ABNORMAL HIGH (ref 0.0–149.0)
VLDL: 41 mg/dL — ABNORMAL HIGH (ref 0.0–40.0)

## 2024-09-28 LAB — HEMOGLOBIN A1C: Hgb A1c MFr Bld: 5.8 % (ref 4.6–6.5)

## 2024-09-28 LAB — SEDIMENTATION RATE: Sed Rate: 11 mm/h (ref 0–30)

## 2024-09-28 LAB — TSH: TSH: 4.05 u[IU]/mL (ref 0.35–5.50)

## 2024-09-28 LAB — C-REACTIVE PROTEIN: CRP: <0.5 mg/dL (ref 0.5–20.0)

## 2024-09-28 NOTE — Assessment & Plan Note (Signed)
 Acute Did have some fatigue prior to having COVID, but since then fatigue has been significant. She is concerned because it does not seem to be improving Sleep is not great, but there has been no change She is exercising regularly Will check CBC, CMP, TSH Not sure she is having some vasculitis given the Raynaud's symptom in 1 finger-Will check ENA panel, ANA, CRP, ESR

## 2024-09-28 NOTE — Assessment & Plan Note (Addendum)
 Chronic Lab Results  Component Value Date   LDLCALC 144 (H) 09/28/2024  Has been eating healthy and exercising regularly CAC score 18 on 07/2022 Check lipid panel Prefers to avoid medication

## 2024-09-28 NOTE — Assessment & Plan Note (Signed)
 Chronic Lab Results  Component Value Date   HGBA1C 5.8 09/28/2024   Check A1c Low sugar / carb diet Stressed regular exercise

## 2024-09-28 NOTE — Assessment & Plan Note (Signed)
 Chronic  Has been experiencing increased fatigue and weight loss Check TSH Continue taking levothyroxine  50 mcg daily

## 2024-09-28 NOTE — Assessment & Plan Note (Signed)
 Chronic Discussed that she has had multiple blood pressure readings of the been elevated and I believe she has hypertension and needs medication She has not been monitoring her BP at home and would like to monitor her BP at home first before committing to medication Stressed concerns with poorly controlled blood pressure She will update me via MyChart with her readings-consider low-dose amlodipine 

## 2024-09-28 NOTE — Patient Instructions (Addendum)
      Blood work was ordered.       Medications changes include :   None    A referral was ordered and someone will call you to schedule an See a hand orthopedic for the skin lump and raynaud phenomenon.

## 2024-09-28 NOTE — Assessment & Plan Note (Signed)
 Acute Has a lump on the right hand-palm It just developed without trauma It is tender ?  Ganglion cyst Will see orthopedic hand surgery

## 2024-09-28 NOTE — Assessment & Plan Note (Signed)
 New Recently has been experiencing Raynaud's phenomenon in her right middle finger between her PIP and DIP joints up to the tip has been turning white and it has been painful Lasts approximately 10 minutes No other fingers affected Given that it is just 1 finger-?  Vasculitis ENA panel, ESR, CRP, ANA Will be seen hand Ortho and will discuss with them

## 2024-09-29 ENCOUNTER — Encounter: Payer: Self-pay | Admitting: Internal Medicine

## 2024-09-29 MED ORDER — AMLODIPINE BESYLATE 2.5 MG PO TABS
2.5000 mg | ORAL_TABLET | Freq: Every day | ORAL | 3 refills | Status: AC
Start: 2024-09-29 — End: ?

## 2024-09-30 ENCOUNTER — Ambulatory Visit: Payer: Self-pay | Admitting: Internal Medicine

## 2024-09-30 DIAGNOSIS — R7689 Other specified abnormal immunological findings in serum: Secondary | ICD-10-CM

## 2024-09-30 LAB — ANTI-NUCLEAR AB-TITER (ANA TITER)
ANA TITER: 1:320 {titer} — ABNORMAL HIGH
ANA Titer 1: 1:320 {titer} — ABNORMAL HIGH

## 2024-09-30 LAB — ANA: Anti Nuclear Antibody (ANA): POSITIVE — AB

## 2024-10-01 ENCOUNTER — Telehealth: Payer: Self-pay

## 2024-10-01 ENCOUNTER — Encounter: Payer: Self-pay | Admitting: Internal Medicine

## 2024-10-01 DIAGNOSIS — R7689 Other specified abnormal immunological findings in serum: Secondary | ICD-10-CM

## 2024-10-01 DIAGNOSIS — M2550 Pain in unspecified joint: Secondary | ICD-10-CM

## 2024-10-01 NOTE — Telephone Encounter (Signed)
 Copied from CRM [id number]. Topic: Clinical - Medical Advice >> Oct 01, 2024  9:16 AM Tracie Hart wrote: Reason for CRM: Patient has blood work has autoimmune disease Rheumatologist appointment is not until March-patient would like for Dr.Burns to find another provider or to ask office to get her in earlier

## 2024-10-01 NOTE — Telephone Encounter (Signed)
 New referral ordered for [facility]

## 2024-10-01 NOTE — Addendum Note (Signed)
 Addended by: GEOFM GLADE PARAS on: 10/01/2024 03:57 PM   Modules accepted: Orders

## 2024-10-05 ENCOUNTER — Ambulatory Visit: Payer: Self-pay

## 2024-10-05 ENCOUNTER — Telehealth: Payer: Self-pay

## 2024-10-05 NOTE — Telephone Encounter (Signed)
 FYI Only or Action Required?: Action required by provider: referral request.  Patient was last seen in primary care on 09/28/2024 by Geofm Glade PARAS, MD.  Called Nurse Triage reporting Advice Only.  Symptoms began several weeks ago.  Interventions attempted: Rest, hydration, or home remedies.  Symptoms are: gradually worsening.  Triage Disposition: Call PCP When Office is Open  Patient/caregiver understands and will follow disposition?: Yes Reason for Disposition  [1] Caller requesting NON-URGENT health information AND [2] PCP's office is the best resource  Answer Assessment - Initial Assessment Questions Patient adamant on getting a referral to Rheumatologist that can see her as soon as possible, she states she is feeling horrible, losing weight and cannot wait to be seen and is looking for any assistance. Please advise.   1. REASON FOR CALL: What is the main reason for your call? or How can I best help you?     Patient calling in regards to referral to Rheumatology, original referral cannot schedule patient until March.  2. SYMPTOMS : Do you have any symptoms?      Weight loss, fatigue, nasal congestion, stomach hurts, overall not feeling well, since having Covid, had bronchitis and now a cold and just will not get better.  Protocols used: Information Only Call - No Triage-A-AH  Copied from CRM [id number]. Topic: Clinical - Red Word Triage >> Oct 05, 2024 11:35 AM Franky GRADE wrote: Red Word that prompted transfer to Nurse Triage: Patient was seen on 09/28/2024 due to positive covid test, additional test were done and patient was diagnosed with autoimmune disease, patient does not feel better symptoms are worsening.

## 2024-10-05 NOTE — Telephone Encounter (Signed)
 Copied from CRM #[id number]. Topic: Referral - Status >> Oct 05, 2024 11:33 AM Franky GRADE wrote: Reason for CRM: Patient calling because Rheumatology informed patient that they would not be able to get her in until March, she does not want to wait that long as she feels her condition is worsening. She would like to know if Dr.Burns can assist in expediting the appointment.

## 2024-10-05 NOTE — Telephone Encounter (Signed)
 Previous message already sent to Dr. Geofm to address.

## 2024-10-05 NOTE — Telephone Encounter (Signed)
 She also sent a MyChart message-I referred her to 2 different rheumatologist.  I am not sure which one she is referring to.  It takes time to get into a rheumatologist.  She can call rheumatology offices to see who is able to get her in quicker, but I did put 2 referrals in for 2 different places

## 2024-10-06 ENCOUNTER — Telehealth: Payer: Self-pay

## 2024-10-06 NOTE — Telephone Encounter (Signed)
 Notes faxed today

## 2024-10-06 NOTE — Telephone Encounter (Signed)
 Copied from CRM [id number]. Topic: Clinical - Lab/Test Results >> Oct 06, 2024  8:31 AM Rea ORN wrote: Reason for CRM: Pt would like recent lab results faxed  to [facility], fax [phone]. She has an appt there 11/24 and will need the results for the appt.   Please call pt back at [phone] if you have any additional questions.

## 2024-10-11 DIAGNOSIS — R634 Abnormal weight loss: Secondary | ICD-10-CM | POA: Diagnosis not present

## 2024-10-11 DIAGNOSIS — M79642 Pain in left hand: Secondary | ICD-10-CM | POA: Diagnosis not present

## 2024-10-11 DIAGNOSIS — M79641 Pain in right hand: Secondary | ICD-10-CM | POA: Diagnosis not present

## 2024-10-11 DIAGNOSIS — I7300 Raynaud's syndrome without gangrene: Secondary | ICD-10-CM | POA: Diagnosis not present

## 2024-10-11 DIAGNOSIS — R5382 Chronic fatigue, unspecified: Secondary | ICD-10-CM | POA: Diagnosis not present

## 2024-10-11 DIAGNOSIS — Z6821 Body mass index (BMI) 21.0-21.9, adult: Secondary | ICD-10-CM | POA: Diagnosis not present

## 2024-10-11 DIAGNOSIS — R7689 Other specified abnormal immunological findings in serum: Secondary | ICD-10-CM | POA: Diagnosis not present

## 2024-10-29 ENCOUNTER — Other Ambulatory Visit: Payer: Self-pay | Admitting: Internal Medicine

## 2024-12-02 ENCOUNTER — Other Ambulatory Visit: Payer: Self-pay | Admitting: Internal Medicine

## 2024-12-02 DIAGNOSIS — Z1231 Encounter for screening mammogram for malignant neoplasm of breast: Secondary | ICD-10-CM

## 2024-12-07 ENCOUNTER — Other Ambulatory Visit: Payer: Self-pay | Admitting: Internal Medicine

## 2024-12-14 ENCOUNTER — Ambulatory Visit

## 2025-01-14 ENCOUNTER — Ambulatory Visit
